# Patient Record
Sex: Female | Born: 1965 | Race: Black or African American | Hispanic: No | Marital: Married | Smoking: Former smoker
Health system: Southern US, Community
[De-identification: ages and names within clinical notes are randomized; demographics above are authoritative.]

## PROBLEM LIST (undated history)

## (undated) DIAGNOSIS — C50919 Malignant neoplasm of unspecified site of unspecified female breast: Secondary | ICD-10-CM

## (undated) DIAGNOSIS — Z87898 Personal history of other specified conditions: Secondary | ICD-10-CM

## (undated) DIAGNOSIS — I959 Hypotension, unspecified: Secondary | ICD-10-CM

## (undated) DIAGNOSIS — Z9009 Acquired absence of other part of head and neck: Secondary | ICD-10-CM

## (undated) DIAGNOSIS — E2740 Unspecified adrenocortical insufficiency: Secondary | ICD-10-CM

## (undated) DIAGNOSIS — D649 Anemia, unspecified: Secondary | ICD-10-CM

## (undated) DIAGNOSIS — G8929 Other chronic pain: Secondary | ICD-10-CM

## (undated) DIAGNOSIS — K625 Hemorrhage of anus and rectum: Secondary | ICD-10-CM

## (undated) DIAGNOSIS — Z3041 Encounter for surveillance of contraceptive pills: Secondary | ICD-10-CM

## (undated) DIAGNOSIS — N186 End stage renal disease: Secondary | ICD-10-CM

## (undated) DIAGNOSIS — T829XXA Unspecified complication of cardiac and vascular prosthetic device, implant and graft, initial encounter: Secondary | ICD-10-CM

## (undated) DIAGNOSIS — G4730 Sleep apnea, unspecified: Secondary | ICD-10-CM

## (undated) DIAGNOSIS — I10 Essential (primary) hypertension: Secondary | ICD-10-CM

## (undated) DIAGNOSIS — K661 Hemoperitoneum: Secondary | ICD-10-CM

## (undated) DIAGNOSIS — N390 Urinary tract infection, site not specified: Secondary | ICD-10-CM

## (undated) DIAGNOSIS — R51 Headache: Secondary | ICD-10-CM

## (undated) HISTORY — DX: Headache: R51

## (undated) HISTORY — DX: Other chronic pain: G89.29

## (undated) HISTORY — DX: Unspecified adrenocortical insufficiency: E27.40

## (undated) HISTORY — DX: Malignant neoplasm of unspecified site of unspecified female breast: C50.919

## (undated) HISTORY — DX: End stage renal disease: N18.6

## (undated) HISTORY — DX: Personal history of other specified conditions: Z87.898

## (undated) HISTORY — DX: Hemoperitoneum: K66.1

## (undated) HISTORY — DX: Acquired absence of other part of head and neck: Z90.09

## (undated) HISTORY — DX: Essential (primary) hypertension: I10

## (undated) HISTORY — PX: MASTECTOMY: SHX3

## (undated) HISTORY — PX: OTHER SURGICAL HISTORY: SHX169

## (undated) HISTORY — DX: Hemorrhage of anus and rectum: K62.5

## (undated) HISTORY — PX: EXPLORATION RENAL S/P TRANSPLANTATION: SUR589

## (undated) HISTORY — DX: Urinary tract infection, site not specified: N39.0

## (undated) HISTORY — PX: TUBAL LIGATION: SHX77

---

## 1997-12-16 DIAGNOSIS — E892 Postprocedural hypoparathyroidism: Secondary | ICD-10-CM

## 1997-12-16 DIAGNOSIS — Z9089 Acquired absence of other organs: Secondary | ICD-10-CM

## 1997-12-16 HISTORY — DX: Acquired absence of other organs: Z90.89

## 1997-12-16 HISTORY — DX: Postprocedural hypoparathyroidism: E89.2

## 1998-03-16 ENCOUNTER — Other Ambulatory Visit: Admission: RE | Admit: 1998-03-16 | Discharge: 1998-03-16 | Payer: Self-pay | Admitting: Nephrology

## 1998-03-18 ENCOUNTER — Other Ambulatory Visit: Admission: RE | Admit: 1998-03-18 | Discharge: 1998-03-18 | Payer: Self-pay | Admitting: Nephrology

## 1998-03-21 ENCOUNTER — Other Ambulatory Visit: Admission: RE | Admit: 1998-03-21 | Discharge: 1998-03-21 | Payer: Self-pay | Admitting: Nephrology

## 1998-04-08 ENCOUNTER — Other Ambulatory Visit: Admission: RE | Admit: 1998-04-08 | Discharge: 1998-04-08 | Payer: Self-pay | Admitting: Nephrology

## 1998-04-08 ENCOUNTER — Ambulatory Visit (HOSPITAL_COMMUNITY): Admission: AD | Admit: 1998-04-08 | Discharge: 1998-04-08 | Payer: Self-pay | Admitting: Physician Assistant

## 1998-04-10 ENCOUNTER — Other Ambulatory Visit: Admission: RE | Admit: 1998-04-10 | Discharge: 1998-04-10 | Payer: Self-pay | Admitting: *Deleted

## 1998-04-11 ENCOUNTER — Inpatient Hospital Stay (HOSPITAL_COMMUNITY): Admission: AD | Admit: 1998-04-11 | Discharge: 1998-04-14 | Payer: Self-pay | Admitting: Vascular Surgery

## 1998-04-18 ENCOUNTER — Other Ambulatory Visit: Admission: RE | Admit: 1998-04-18 | Discharge: 1998-04-18 | Payer: Self-pay | Admitting: Nephrology

## 1998-04-20 ENCOUNTER — Other Ambulatory Visit: Admission: RE | Admit: 1998-04-20 | Discharge: 1998-04-20 | Payer: Self-pay | Admitting: Nephrology

## 1998-04-22 ENCOUNTER — Emergency Department (HOSPITAL_COMMUNITY): Admission: EM | Admit: 1998-04-22 | Discharge: 1998-04-22 | Payer: Self-pay | Admitting: Emergency Medicine

## 1998-04-25 ENCOUNTER — Ambulatory Visit (HOSPITAL_COMMUNITY): Admission: RE | Admit: 1998-04-25 | Discharge: 1998-04-25 | Payer: Self-pay | Admitting: Nephrology

## 1998-05-04 ENCOUNTER — Other Ambulatory Visit: Admission: RE | Admit: 1998-05-04 | Discharge: 1998-05-04 | Payer: Self-pay | Admitting: Nephrology

## 1998-05-06 ENCOUNTER — Ambulatory Visit: Admission: RE | Admit: 1998-05-06 | Discharge: 1998-05-06 | Payer: Self-pay | Admitting: Physician Assistant

## 1998-05-06 ENCOUNTER — Other Ambulatory Visit: Admission: RE | Admit: 1998-05-06 | Discharge: 1998-05-06 | Payer: Self-pay | Admitting: Nephrology

## 1998-06-01 ENCOUNTER — Other Ambulatory Visit: Admission: RE | Admit: 1998-06-01 | Discharge: 1998-06-01 | Payer: Self-pay | Admitting: *Deleted

## 1998-06-05 ENCOUNTER — Ambulatory Visit (HOSPITAL_COMMUNITY): Admission: RE | Admit: 1998-06-05 | Discharge: 1998-06-05 | Payer: Self-pay | Admitting: Nephrology

## 1998-06-06 ENCOUNTER — Ambulatory Visit (HOSPITAL_COMMUNITY): Admission: RE | Admit: 1998-06-06 | Discharge: 1998-06-06 | Payer: Self-pay | Admitting: Vascular Surgery

## 1998-06-08 ENCOUNTER — Other Ambulatory Visit: Admission: RE | Admit: 1998-06-08 | Discharge: 1998-06-08 | Payer: Self-pay | Admitting: *Deleted

## 1998-06-08 ENCOUNTER — Ambulatory Visit (HOSPITAL_COMMUNITY): Admission: RE | Admit: 1998-06-08 | Discharge: 1998-06-08 | Payer: Self-pay | Admitting: Vascular Surgery

## 1998-06-11 ENCOUNTER — Other Ambulatory Visit: Admission: RE | Admit: 1998-06-11 | Discharge: 1998-06-11 | Payer: Self-pay | Admitting: *Deleted

## 1998-06-13 ENCOUNTER — Other Ambulatory Visit: Admission: RE | Admit: 1998-06-13 | Discharge: 1998-06-13 | Payer: Self-pay | Admitting: Nephrology

## 1998-06-15 ENCOUNTER — Other Ambulatory Visit: Admission: RE | Admit: 1998-06-15 | Discharge: 1998-06-15 | Payer: Self-pay | Admitting: *Deleted

## 1998-06-23 ENCOUNTER — Ambulatory Visit (HOSPITAL_COMMUNITY): Admission: RE | Admit: 1998-06-23 | Discharge: 1998-06-23 | Payer: Self-pay | Admitting: Nephrology

## 1998-07-01 ENCOUNTER — Other Ambulatory Visit: Admission: RE | Admit: 1998-07-01 | Discharge: 1998-07-01 | Payer: Self-pay | Admitting: *Deleted

## 1998-07-03 ENCOUNTER — Ambulatory Visit (HOSPITAL_COMMUNITY): Admission: RE | Admit: 1998-07-03 | Discharge: 1998-07-03 | Payer: Self-pay | Admitting: Nephrology

## 1998-07-06 ENCOUNTER — Other Ambulatory Visit: Admission: RE | Admit: 1998-07-06 | Discharge: 1998-07-06 | Payer: Self-pay | Admitting: *Deleted

## 1998-07-08 ENCOUNTER — Emergency Department (HOSPITAL_COMMUNITY): Admission: EM | Admit: 1998-07-08 | Discharge: 1998-07-08 | Payer: Self-pay | Admitting: Emergency Medicine

## 1998-07-19 ENCOUNTER — Ambulatory Visit (HOSPITAL_COMMUNITY): Admission: RE | Admit: 1998-07-19 | Discharge: 1998-07-19 | Payer: Self-pay | Admitting: Nephrology

## 1998-07-20 ENCOUNTER — Encounter (HOSPITAL_COMMUNITY): Admission: RE | Admit: 1998-07-20 | Discharge: 1998-10-18 | Payer: Self-pay | Admitting: Nephrology

## 1998-10-31 ENCOUNTER — Inpatient Hospital Stay (HOSPITAL_COMMUNITY): Admission: RE | Admit: 1998-10-31 | Discharge: 1998-11-01 | Payer: Self-pay

## 2001-12-23 ENCOUNTER — Encounter: Payer: Self-pay | Admitting: Nephrology

## 2001-12-23 ENCOUNTER — Encounter: Admission: RE | Admit: 2001-12-23 | Discharge: 2001-12-23 | Payer: Self-pay | Admitting: Nephrology

## 2002-05-24 ENCOUNTER — Encounter (INDEPENDENT_AMBULATORY_CARE_PROVIDER_SITE_OTHER): Payer: Self-pay | Admitting: *Deleted

## 2002-05-24 ENCOUNTER — Inpatient Hospital Stay (HOSPITAL_COMMUNITY): Admission: EM | Admit: 2002-05-24 | Discharge: 2002-05-28 | Payer: Self-pay

## 2002-05-24 ENCOUNTER — Encounter: Payer: Self-pay | Admitting: Nephrology

## 2002-05-26 ENCOUNTER — Encounter: Payer: Self-pay | Admitting: Nephrology

## 2002-05-27 ENCOUNTER — Encounter: Payer: Self-pay | Admitting: Nephrology

## 2002-09-01 ENCOUNTER — Encounter: Payer: Self-pay | Admitting: Emergency Medicine

## 2002-09-01 ENCOUNTER — Emergency Department (HOSPITAL_COMMUNITY): Admission: EM | Admit: 2002-09-01 | Discharge: 2002-09-01 | Payer: Self-pay | Admitting: Emergency Medicine

## 2002-11-09 ENCOUNTER — Inpatient Hospital Stay (HOSPITAL_COMMUNITY): Admission: AD | Admit: 2002-11-09 | Discharge: 2002-11-09 | Payer: Self-pay | Admitting: *Deleted

## 2002-11-13 ENCOUNTER — Inpatient Hospital Stay (HOSPITAL_COMMUNITY): Admission: AD | Admit: 2002-11-13 | Discharge: 2002-11-13 | Payer: Self-pay | Admitting: Obstetrics and Gynecology

## 2003-05-10 ENCOUNTER — Encounter: Admission: RE | Admit: 2003-05-10 | Discharge: 2003-05-10 | Payer: Self-pay | Admitting: Nephrology

## 2003-05-10 ENCOUNTER — Encounter: Payer: Self-pay | Admitting: Nephrology

## 2003-07-14 ENCOUNTER — Encounter: Payer: Self-pay | Admitting: Obstetrics & Gynecology

## 2003-07-14 ENCOUNTER — Ambulatory Visit (HOSPITAL_COMMUNITY): Admission: RE | Admit: 2003-07-14 | Discharge: 2003-07-14 | Payer: Self-pay | Admitting: Obstetrics & Gynecology

## 2003-08-29 ENCOUNTER — Ambulatory Visit (HOSPITAL_COMMUNITY): Admission: RE | Admit: 2003-08-29 | Discharge: 2003-08-29 | Payer: Self-pay | Admitting: Obstetrics & Gynecology

## 2003-08-29 ENCOUNTER — Encounter: Payer: Self-pay | Admitting: Obstetrics & Gynecology

## 2003-10-08 ENCOUNTER — Inpatient Hospital Stay (HOSPITAL_COMMUNITY): Admission: AD | Admit: 2003-10-08 | Discharge: 2003-10-08 | Payer: Self-pay | Admitting: Obstetrics

## 2004-06-05 ENCOUNTER — Ambulatory Visit (HOSPITAL_COMMUNITY): Admission: RE | Admit: 2004-06-05 | Discharge: 2004-06-05 | Payer: Self-pay | Admitting: Obstetrics & Gynecology

## 2005-05-07 ENCOUNTER — Encounter: Admission: RE | Admit: 2005-05-07 | Discharge: 2005-05-07 | Payer: Self-pay | Admitting: Nephrology

## 2005-10-10 ENCOUNTER — Encounter: Admission: RE | Admit: 2005-10-10 | Discharge: 2005-10-10 | Payer: Self-pay | Admitting: Nephrology

## 2005-12-15 ENCOUNTER — Emergency Department (HOSPITAL_COMMUNITY): Admission: EM | Admit: 2005-12-15 | Discharge: 2005-12-15 | Payer: Self-pay | Admitting: Emergency Medicine

## 2005-12-16 DIAGNOSIS — K625 Hemorrhage of anus and rectum: Secondary | ICD-10-CM

## 2005-12-16 HISTORY — DX: Hemorrhage of anus and rectum: K62.5

## 2006-02-03 ENCOUNTER — Emergency Department (HOSPITAL_COMMUNITY): Admission: EM | Admit: 2006-02-03 | Discharge: 2006-02-03 | Payer: Self-pay | Admitting: Emergency Medicine

## 2006-08-03 ENCOUNTER — Inpatient Hospital Stay (HOSPITAL_COMMUNITY): Admission: EM | Admit: 2006-08-03 | Discharge: 2006-08-11 | Payer: Self-pay | Admitting: Emergency Medicine

## 2006-08-05 ENCOUNTER — Encounter: Payer: Self-pay | Admitting: Vascular Surgery

## 2006-08-05 ENCOUNTER — Encounter: Payer: Self-pay | Admitting: Internal Medicine

## 2006-08-11 ENCOUNTER — Ambulatory Visit: Payer: Self-pay | Admitting: Gastroenterology

## 2006-08-16 DIAGNOSIS — K661 Hemoperitoneum: Secondary | ICD-10-CM

## 2006-08-16 DIAGNOSIS — K683 Retroperitoneal hematoma: Secondary | ICD-10-CM

## 2006-08-16 HISTORY — DX: Hemoperitoneum: K66.1

## 2006-08-16 HISTORY — DX: Retroperitoneal hematoma: K68.3

## 2006-08-26 ENCOUNTER — Inpatient Hospital Stay (HOSPITAL_COMMUNITY): Admission: EM | Admit: 2006-08-26 | Discharge: 2006-08-29 | Payer: Self-pay | Admitting: Emergency Medicine

## 2006-08-27 ENCOUNTER — Encounter (INDEPENDENT_AMBULATORY_CARE_PROVIDER_SITE_OTHER): Payer: Self-pay | Admitting: Specialist

## 2006-09-10 ENCOUNTER — Ambulatory Visit (HOSPITAL_COMMUNITY): Admission: RE | Admit: 2006-09-10 | Discharge: 2006-09-10 | Payer: Self-pay | Admitting: Nephrology

## 2006-09-17 ENCOUNTER — Ambulatory Visit: Payer: Self-pay | Admitting: Internal Medicine

## 2006-09-19 ENCOUNTER — Ambulatory Visit: Payer: Self-pay | Admitting: Gastroenterology

## 2006-09-24 ENCOUNTER — Ambulatory Visit (HOSPITAL_COMMUNITY): Admission: RE | Admit: 2006-09-24 | Discharge: 2006-09-24 | Payer: Self-pay | Admitting: Nephrology

## 2006-10-20 ENCOUNTER — Ambulatory Visit: Payer: Self-pay | Admitting: Cardiology

## 2007-01-02 ENCOUNTER — Ambulatory Visit (HOSPITAL_COMMUNITY): Admission: RE | Admit: 2007-01-02 | Discharge: 2007-01-02 | Payer: Self-pay | Admitting: Obstetrics & Gynecology

## 2007-02-02 ENCOUNTER — Ambulatory Visit (HOSPITAL_COMMUNITY): Admission: RE | Admit: 2007-02-02 | Discharge: 2007-02-02 | Payer: Self-pay | Admitting: Nephrology

## 2007-02-09 ENCOUNTER — Encounter: Admission: RE | Admit: 2007-02-09 | Discharge: 2007-02-09 | Payer: Self-pay | Admitting: Nephrology

## 2007-02-19 ENCOUNTER — Encounter (HOSPITAL_COMMUNITY): Admission: RE | Admit: 2007-02-19 | Discharge: 2007-05-20 | Payer: Self-pay | Admitting: Nephrology

## 2007-04-08 ENCOUNTER — Ambulatory Visit: Payer: Self-pay | Admitting: Vascular Surgery

## 2007-05-08 ENCOUNTER — Ambulatory Visit: Payer: Self-pay | Admitting: Vascular Surgery

## 2007-05-08 ENCOUNTER — Ambulatory Visit (HOSPITAL_COMMUNITY): Admission: RE | Admit: 2007-05-08 | Discharge: 2007-05-08 | Payer: Self-pay | Admitting: Vascular Surgery

## 2007-05-08 HISTORY — PX: AV FISTULA PLACEMENT: SHX1204

## 2007-05-15 ENCOUNTER — Ambulatory Visit (HOSPITAL_COMMUNITY): Admission: RE | Admit: 2007-05-15 | Discharge: 2007-05-15 | Payer: Self-pay | Admitting: Vascular Surgery

## 2007-06-12 ENCOUNTER — Ambulatory Visit (HOSPITAL_COMMUNITY): Admission: RE | Admit: 2007-06-12 | Discharge: 2007-06-12 | Payer: Self-pay | Admitting: Nephrology

## 2007-07-17 ENCOUNTER — Ambulatory Visit (HOSPITAL_COMMUNITY): Admission: RE | Admit: 2007-07-17 | Discharge: 2007-07-17 | Payer: Self-pay | Admitting: Nephrology

## 2007-08-18 ENCOUNTER — Ambulatory Visit (HOSPITAL_COMMUNITY): Admission: RE | Admit: 2007-08-18 | Discharge: 2007-08-18 | Payer: Self-pay | Admitting: Vascular Surgery

## 2007-08-18 ENCOUNTER — Ambulatory Visit: Payer: Self-pay | Admitting: Vascular Surgery

## 2007-08-19 ENCOUNTER — Ambulatory Visit (HOSPITAL_COMMUNITY): Admission: RE | Admit: 2007-08-19 | Discharge: 2007-08-19 | Payer: Self-pay | Admitting: Vascular Surgery

## 2007-08-23 ENCOUNTER — Emergency Department (HOSPITAL_COMMUNITY): Admission: EM | Admit: 2007-08-23 | Discharge: 2007-08-24 | Payer: Self-pay | Admitting: Emergency Medicine

## 2007-08-24 ENCOUNTER — Observation Stay (HOSPITAL_COMMUNITY): Admission: RE | Admit: 2007-08-24 | Discharge: 2007-08-24 | Payer: Self-pay | Admitting: Nephrology

## 2007-09-23 ENCOUNTER — Ambulatory Visit (HOSPITAL_COMMUNITY): Admission: RE | Admit: 2007-09-23 | Discharge: 2007-09-23 | Payer: Self-pay | Admitting: Nephrology

## 2007-09-30 ENCOUNTER — Ambulatory Visit (HOSPITAL_COMMUNITY): Admission: RE | Admit: 2007-09-30 | Discharge: 2007-09-30 | Payer: Self-pay | Admitting: Nephrology

## 2007-10-16 ENCOUNTER — Ambulatory Visit (HOSPITAL_COMMUNITY): Admission: RE | Admit: 2007-10-16 | Discharge: 2007-10-16 | Payer: Self-pay | Admitting: Nephrology

## 2007-11-20 ENCOUNTER — Ambulatory Visit (HOSPITAL_COMMUNITY): Admission: RE | Admit: 2007-11-20 | Discharge: 2007-11-20 | Payer: Self-pay | Admitting: Nephrology

## 2007-12-17 DIAGNOSIS — E2740 Unspecified adrenocortical insufficiency: Secondary | ICD-10-CM

## 2007-12-17 HISTORY — DX: Unspecified adrenocortical insufficiency: E27.40

## 2007-12-28 ENCOUNTER — Ambulatory Visit (HOSPITAL_COMMUNITY): Admission: RE | Admit: 2007-12-28 | Discharge: 2007-12-28 | Payer: Self-pay | Admitting: Nephrology

## 2008-01-06 ENCOUNTER — Ambulatory Visit (HOSPITAL_COMMUNITY): Admission: RE | Admit: 2008-01-06 | Discharge: 2008-01-06 | Payer: Self-pay | Admitting: Nephrology

## 2008-01-08 ENCOUNTER — Ambulatory Visit (HOSPITAL_COMMUNITY): Admission: RE | Admit: 2008-01-08 | Discharge: 2008-01-08 | Payer: Self-pay | Admitting: Obstetrics & Gynecology

## 2008-01-13 ENCOUNTER — Encounter (INDEPENDENT_AMBULATORY_CARE_PROVIDER_SITE_OTHER): Payer: Self-pay | Admitting: Nephrology

## 2008-01-13 ENCOUNTER — Ambulatory Visit (HOSPITAL_COMMUNITY): Admission: RE | Admit: 2008-01-13 | Discharge: 2008-01-13 | Payer: Self-pay | Admitting: Nephrology

## 2008-01-13 ENCOUNTER — Ambulatory Visit: Payer: Self-pay | Admitting: Cardiology

## 2008-02-10 ENCOUNTER — Encounter: Admission: RE | Admit: 2008-02-10 | Discharge: 2008-02-10 | Payer: Self-pay | Admitting: Obstetrics & Gynecology

## 2008-02-12 ENCOUNTER — Encounter (INDEPENDENT_AMBULATORY_CARE_PROVIDER_SITE_OTHER): Payer: Self-pay | Admitting: Diagnostic Radiology

## 2008-02-12 ENCOUNTER — Encounter: Admission: RE | Admit: 2008-02-12 | Discharge: 2008-02-12 | Payer: Self-pay | Admitting: Obstetrics & Gynecology

## 2008-02-17 ENCOUNTER — Ambulatory Visit (HOSPITAL_COMMUNITY): Admission: RE | Admit: 2008-02-17 | Discharge: 2008-02-17 | Payer: Self-pay | Admitting: Nephrology

## 2008-02-22 ENCOUNTER — Ambulatory Visit (HOSPITAL_COMMUNITY): Admission: RE | Admit: 2008-02-22 | Discharge: 2008-02-22 | Payer: Self-pay | Admitting: Nephrology

## 2008-03-04 ENCOUNTER — Encounter (HOSPITAL_BASED_OUTPATIENT_CLINIC_OR_DEPARTMENT_OTHER): Payer: Self-pay | Admitting: General Surgery

## 2008-03-04 ENCOUNTER — Encounter: Admission: RE | Admit: 2008-03-04 | Discharge: 2008-03-04 | Payer: Self-pay | Admitting: General Surgery

## 2008-03-04 ENCOUNTER — Ambulatory Visit (HOSPITAL_COMMUNITY): Admission: RE | Admit: 2008-03-04 | Discharge: 2008-03-04 | Payer: Self-pay | Admitting: General Surgery

## 2008-03-24 ENCOUNTER — Ambulatory Visit: Payer: Self-pay | Admitting: Oncology

## 2008-03-31 ENCOUNTER — Ambulatory Visit: Admission: RE | Admit: 2008-03-31 | Discharge: 2008-06-29 | Payer: Self-pay | Admitting: Radiation Oncology

## 2008-04-18 ENCOUNTER — Encounter: Admission: RE | Admit: 2008-04-18 | Discharge: 2008-04-18 | Payer: Self-pay | Admitting: Radiation Oncology

## 2008-04-27 ENCOUNTER — Ambulatory Visit (HOSPITAL_COMMUNITY): Admission: RE | Admit: 2008-04-27 | Discharge: 2008-04-27 | Payer: Self-pay | Admitting: Nephrology

## 2008-05-08 ENCOUNTER — Inpatient Hospital Stay (HOSPITAL_COMMUNITY): Admission: EM | Admit: 2008-05-08 | Discharge: 2008-05-13 | Payer: Self-pay | Admitting: Emergency Medicine

## 2008-05-08 ENCOUNTER — Ambulatory Visit: Payer: Self-pay | Admitting: Internal Medicine

## 2008-05-10 ENCOUNTER — Ambulatory Visit: Payer: Self-pay | Admitting: Internal Medicine

## 2008-05-14 ENCOUNTER — Ambulatory Visit (HOSPITAL_COMMUNITY): Admission: RE | Admit: 2008-05-14 | Discharge: 2008-05-14 | Payer: Self-pay | Admitting: Nephrology

## 2008-06-22 ENCOUNTER — Emergency Department (HOSPITAL_COMMUNITY): Admission: EM | Admit: 2008-06-22 | Discharge: 2008-06-22 | Payer: Self-pay | Admitting: Emergency Medicine

## 2008-12-14 HISTORY — PX: OTHER SURGICAL HISTORY: SHX169

## 2009-01-11 ENCOUNTER — Encounter: Admission: RE | Admit: 2009-01-11 | Discharge: 2009-01-11 | Payer: Self-pay | Admitting: Radiation Oncology

## 2009-04-05 ENCOUNTER — Encounter: Admission: RE | Admit: 2009-04-05 | Discharge: 2009-04-05 | Payer: Self-pay | Admitting: Radiation Oncology

## 2009-04-17 HISTORY — PX: OTHER SURGICAL HISTORY: SHX169

## 2009-06-02 ENCOUNTER — Ambulatory Visit: Payer: Self-pay | Admitting: Vascular Surgery

## 2009-06-02 ENCOUNTER — Ambulatory Visit (HOSPITAL_COMMUNITY): Admission: RE | Admit: 2009-06-02 | Discharge: 2009-06-02 | Payer: Self-pay | Admitting: Vascular Surgery

## 2010-03-02 ENCOUNTER — Ambulatory Visit (HOSPITAL_COMMUNITY): Admission: RE | Admit: 2010-03-02 | Discharge: 2010-03-02 | Payer: Self-pay | Admitting: Obstetrics & Gynecology

## 2010-05-23 ENCOUNTER — Encounter: Admission: RE | Admit: 2010-05-23 | Discharge: 2010-05-23 | Payer: Self-pay | Admitting: General Surgery

## 2011-01-06 ENCOUNTER — Encounter: Payer: Self-pay | Admitting: Obstetrics & Gynecology

## 2011-01-06 ENCOUNTER — Encounter: Payer: Self-pay | Admitting: Gastroenterology

## 2011-01-06 ENCOUNTER — Encounter: Payer: Self-pay | Admitting: Nephrology

## 2011-04-22 ENCOUNTER — Other Ambulatory Visit: Payer: Self-pay | Admitting: General Surgery

## 2011-04-22 DIAGNOSIS — Z9889 Other specified postprocedural states: Secondary | ICD-10-CM

## 2011-04-24 ENCOUNTER — Encounter (INDEPENDENT_AMBULATORY_CARE_PROVIDER_SITE_OTHER): Payer: Self-pay | Admitting: General Surgery

## 2011-04-30 NOTE — Op Note (Signed)
NAME:  Judy Green, Judy Green          ACCOUNT NO.:  [id number]   MEDICAL RECORD NO.:  [id number]          PATIENT TYPE:  AMB   LOCATION:  SDS                          FACILITY:  [facility]   PHYSICIAN:  Di Kindle. Edilia Bo, M.D.DATE OF BIRTH:  09-08-1966   DATE OF PROCEDURE:  DATE OF DISCHARGE:  05/08/2007                               OPERATIVE REPORT   PREOPERATIVE DIAGNOSIS:  Chronic renal failure.   POSTOPERATIVE DIAGNOSIS:  Chronic renal failure.   PROCEDURE:  Insertion of new right upper arm loop AV graft.   SURGEON:  Di Kindle. Edilia Bo, M.D.   ASSISTANT:  Ruthe Mannan, PA   ANESTHESIA:  Local with sedation.   INDICATIONS:  This is a 45 year old woman who had had bilateral forearm  and upper arm grafts and bilateral thigh grafts.  She was running out of  access options; and after reviewing her central venogram I elected to  consider a redo right upper arm graft as one of her last remaining  options for hemodialysis access.  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  With the catheter in place, the thought was  that she had significant risk of developing arm swelling.  However, she  was running out of options.  If she does develop arm swelling, the  thought was that we could potentially move her catheter to her thigh.   TECHNIQUE:  The patient was taken to the operating room and sedated by  anesthesia.  The right upper extremity was prepped and draped in the  usual sterile fashion.  After the skin was infiltrated with 1%  lidocaine, a longitudinal incision was made over the axilla; and here  the old graft was dissected free and excised.  Deep down, an adjacent  brachial vein, different from the previous anastomosis was exposed, and  this was a reasonable size vein.  I made the incision over the previous  arterial anastomosis; however, here there was dense scar tissue; and I  elected, therefore, to place an upper arm loop graft.  The brachial  artery and the axilla even at this level was quite small.  A 4-7 mm  graft was tunneled in a loop fashion in the upper arm with the arterial  aspect of the graft along the radial aspect of the upper arm.  The  patient was then heparinized.   The small brachial artery was clamped proximally and distally and a  longitudinal arteriotomy was made.  The 4-mm end of the graft was  spatulated and sewn end-to-side to the artery using continuous 6-0  Prolene suture.  The graft was then pulled to the appropriate length for  an anastomosis to the axillary vein.  The vein was ligated distally and  spatulated proximally.  The graft was cut to the appropriate length,  spatulated, and sewn end-to-end to the vein using continuous 6-0 Prolene  suture.  At the completion, there was an excellent thrill in the graft.  There was a radial and ulnar signal with the Doppler.  Hemostasis was  obtained in the wound.  The wounds were closed with a deep layer  of  3-0 Vicryl; and the skin closed with 4-0 Vicryl.  A sterile dressing  was applied.  The patient tolerated the procedure well; and was  transferred to the recovery room in satisfactory condition.  All needle  and sponge counts were correct.      Di Kindle. Edilia Bo, M.D.  Electronically Signed     CSD/MEDQ  D:  05/08/2007  T:  05/08/2007  Job:  [id number]

## 2011-04-30 NOTE — Consult Note (Signed)
NAME:  Judy Green, [place]          ACCOUNT NO.:  [id number]   MEDICAL RECORD NO.:  [id number]           PATIENT TYPE:   LOCATION:                                 FACILITY:   PHYSICIAN:  Cynthia B. Eliott Nine, M.D.DATE OF BIRTH:  1966/01/21   DATE OF CONSULTATION:  05/08/2008  DATE OF DISCHARGE:                                 CONSULTATION   PRIMARY ADMITTING TEAM:  [facility].   NEPHROLOGIST:  Dr. Annie Sable MD, [facility].   REASON FOR CONSULTATION:  Hypotension in a woman with end-stage renal  disease who is hemodialysis dependent.   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  She reports an episode of hypotension during hemodialysis  with correction of the episode to an ending blood pressure of  approximately 116/60 with the ultrafiltrate of 3 L per the patient's  report.  Of note, there are no hemodialysis records available at the  time of consultation on Sunday evening.  She gives a history of a fever  of 102 approximately a week and half ago during hemodialysis, for which  blood cultures were drawn and she was given 3 days of an unknown  antibiotic.  Since she has had access issues in the past, her subclavian  access was evaluated, but found to not have any active signs of  infection at the time she had the fever.  She does have a history of  kidney transplant in 2000 with transplant rejection in 2007.  She  received immunosuppression during the period of her transplant.  There  is no known history  of a workup for adrenal insufficiency or other  issues to explain what appears to be some chronic hypotension.  She  denies any prior hypotensive episodes to this degree.  Initial labs show  a stable hemoglobin despite FOBT-positive stools.  At the time of  consultation, she has received IV fluid resuscitation with minimal  response in her blood pressure with current readings in the systolic  90s.  She is now on Neo-Synephrine for pressor activity.  Her normal  hemodialysis days are Tuesday, Thursday, and Saturday.  She receives  dialysis at [facility].  Her estimated dry weight was  103 per the patient.  Her ending weight at hemodialysis was 106.8.  Also, of note, the patient is in the process of being evaluated for  retransplantation in both [place] and at [facility] [place].   PAST MEDICAL HISTORY:  1. Kidney transplant in 2000 with rejection in 2007.  2. History of breast cancer, ductal cell carcinoma in situ in 2009,      status post partial mastectomy.  3. History of GI bleeding secondary to hemorrhoids.  4. History of  pancreatic mass with biopsy showing hematoma.  5. History of retroperitoneal hematoma.  6. Hypertension.  7. History of E. coli urosepsis in 2007.   SOCIAL HISTORY:  Nonsmoker.  No alcohol abuse.  She is employed by  [employer] as a Occupational psychologist and continues to work  until the present time.  She is married, with 2 children.   FAMILY HISTORY:  Mother is alive with diabetes and hypertension.  Father  unknown.  Siblings have a history of hypertension.   CURRENT MEDICATIONS:  1. Elita Quick.  2. Flagyl.  3. Vancomycin.  4. Aspirin 81 mg p.o. daily.  5. Protonix 40 mg daily.  6. PhosLo 1 tablet p.o. t.i.d.  7. Lovenox for DVT prophylaxis.  8. Her home-reported meds are only PhosLo p.o.  9. During dialysis, she receives Procrit.   HEMODIALYSIS ACCESS HISTORY:  Diatek catheter in her left subclavian has  been in place for 8 months.   History of failed AV grafts.  Multiple IJ  and subclavian PermCath.   PHYSICAL EXAMINATION:  VITAL SIGNS:  Blood pressure 80/31, SpO2 97% on 2  L, heart rate 83, temperature 98.6, and respirations 18.  INPUT AND OUTPUT:  Anuric.  GENERAL:  No acute distress.  Obese and pleasant.  HEENT:  Oropharynx clear without exudates.  Pupils equal, round, and  reactive to light.  No extraocular muscle abnormalities.  NECK:  Supple.  No adenopathy.  No JVD.  LUNGS:  Clear.  Good air movement.  CV:  Regular rate and rhythm.  Distant heart sounds.  No audible murmur.  ABDOMEN:  Periumbilical tenderness, diffuse, not localized.  No  rigidity.  Positive bowel sounds.  Only slight guarding on deep  palpation.  No masses palpated.  GENITOURINARY:  No obvious abnormality.  No bladder tenderness.  No  costovertebral tenderness.  EXTREMITIES:  No edema.  No cyanosis.  Pulses are 1+, equal and  symmetric bilateral radial pulses and dorsalis pedis.  There is a scar  on her right arm where she had a parathyroid implant from prior  parathyroidectomy.  NEURO:  Nonfocal exam.  Cranial nerves II-XII intact.  SKIN:  No rashes.   LABORATORY DATA:  Sodium 137, potassium 4.2, chloride 96, bicarbonate  26, BUN 28, creatinine 11.3, and glucose 129.  Hemoglobin 13.3, white  blood cells 22.0, platelets 222, and hematocrit 42.  Lactic acid 3.5,  calcium 9.4, total protein 8.5, albumin 4.1, total bilirubin 0.8, lipase  18, and phosphorus 5.6.  PT 13.1, INR 1.0, and PTT 28.  Fecal occult  blood test positive.   IMAGING:  1. Chest x-ray on May 08, 2008:  No acute cardiopulmonary process.  2. CT of the abdomen and pelvis:  Findings consistent with atrophic      renal transplant of the right iliac fossa, ascending and transverse      colitis, left lower lobe and lingular atelectasis, and atrophic      bilateral kidneys consistent with chronic renal failure.   ASSESSMENT:  Ms. Judy Green is a 45 year old woman with  end-stage renal  disease, status post failed kidney transplant, on hemodialysis  currently, who was admitted with primary issue of severe hypotension.  She has bright red blood per rectum and evidence of lower  gastrointestinal bleeding.  The history of events is indicative of  hypotension with an unknown etiology after hemodialysis, which has  likely led to ischemic colitis.  Given her history of fevers and recent  antibiotics, cannot rule out an underlying cause of  sepsis contributing  to her hypotension.  It does not appear per her reports that there was a  significant volume reduction in hemodialysis.  Also, concerning is her  history of being on immunosuppressive therapy which included steroids.  There may be a component of adrenal insufficiency contributing to the  severity of her presentation.  I cannot find a prior workup for adrenal  insufficiency as an underlying cause of her hypotension, likely  triggered by an underlying sepsis.  Her left subclavian Diatek catheter  has some mild redness around the insertion site, but is nontender and  there is no purulent drainage from the insertion area.  She is now on  Neo-Synephrine and IV fluids to control her blood pressure.  We would  consider adding on some stress-dose steroids to cover for possible  adrenal insufficiency as well as to evaluate cortisol level.  It is  important that we obtain the records from the hemodialysis center to  establish the events that have led up to this admission.  It is  encouraging that the patient symptomatically feels well and has not been  febrile since admission.  She does have a leukocytosis which would  suggest a reaction to an underlying infection.  In terms of the colitis,  which was confirmed on CT scan, conservative symptomatic treatment for  now.  We will plan hemodialysis on her regularly scheduled time on  Tuesday and deal with any volume issues from this blood pressure  resuscitation.    PLAN:  1. Add a stress-dose steroids to current treatment for hypotension.  2. I agree with continuing Neo-Synephrine and IV fluids.  3. We will follow blood cultures obtained prior to the initiation of      antibiotics.  4. Continue with broad-spectrum antibiotic coverage with Fortabs,      Flagyl, and vancomycin.  This will cover both the possibility of a      bacteremia as well as a primary infectious source for the colitis.      We can dose these with hemodialysis.  5. We will check a parathyroid hormone, phosphorus, calcium as well as      an anemia panel to follow routine parameters for end-stage renal      disease pending obtaining hemodialysis records.  6. Her hemoglobin is stable.  We will follow CBCs for any drop in      hemoglobin.  Her blood has already been typed and crossed by the      primary team.  7. When she resumes her diet very cautiously and slowly, I recommend      standard renal diet.  8. Check serum cortisol level and administer hydrocortisone, Solu-      Cortef 100 mg q.6 hours for possible adrenal insufficiency.   Note:  I will be happy to accept this patient onto the renal service as  the primary team.  Thank you for this consultation.      Edsel Petrin, D.O.  Electronically Signed     ______________________________  Duke Salvia Eliott Nine, M.D.    Dorena Dew  D:  05/08/2008  T:  05/09/2008  Job:  [id number]

## 2011-04-30 NOTE — Letter (Signed)
August 14, 2007    Judy Green   RE:  Judy Green, Judy Green  MRN:  [id number]  /  DOB:  05-Dec-1966   Dear Ms. Hoobler:   You did not show for an appointment today which I believe was scheduled  by yourself for lower GI bleeding.  Looking back at our records, I see  you have no-showed two other times in the past 7-8 months as well as  cancelled at the last minute on another occasion.  Needless to say, this  can affect your own care as well as the care of others who would  otherwise have been seen at that time.   Please understand, I would like to help you with any future  gastrointestinal problems that you may have, but I have spoken with my  staff and we will not be arranging for you to schedule another  appointment until you are reevaluated by your primary care physician and  that physician deems that you should be referred back.   Thank you for your understanding.    Sincerely,      Rachael Fee, MD  Electronically Signed    DPJ/MedQ  DD: 08/14/2007  DT: 08/16/2007  Job #: [id number]   CC:    Mindi Slicker. Lowell Guitar, M.D.

## 2011-04-30 NOTE — Op Note (Signed)
NAME:  Judy Green, Judy Green          ACCOUNT NO.:  [id number]   MEDICAL RECORD NO.:  [id number]          PATIENT TYPE:  AMB   LOCATION:  SDS                          FACILITY:  [facility]   PHYSICIAN:  Di Kindle. Edilia Bo, M.D.DATE OF BIRTH:  [birth year]   DATE OF PROCEDURE:  DATE OF DISCHARGE:                               OPERATIVE REPORT   PREOPERATIVE DIAGNOSIS:  Chronic renal failure.   POSTOPERATIVE DIAGNOSIS:  Chronic renal failure.   PROCEDURE:  1. Attempted left internal jugular, right internal jugular, left      subclavian vein Diatek catheter.  2. Placement of a right femoral 55-cm Diatek catheter.   SURGEON:  Edilia Bo.   ASSISTANT:  Nurse.   ANESTHESIA:  Local, converted to a general.   TECHNIQUE:  This patient had had a infected catheter removed from the  right side.  I used the ultrasound scanner and both internal jugular  veins appeared to be patent.  The neck and upper chest were prepped and  draped in the usual sterile fashion.  After the skin was infiltrated  with 1% lidocaine, the left IJ was cannulated, but I was never able to  get the wire to go down into the superior vena cava from the left IJ.  I  shot a central venogram through an end-hole catheter and this showed  what appeared to be some abnormal venous anatomy with a large,  essentially sac, within the vein which appeared to connect with the  azygos system.  I was never able to demonstrate the superior vena cava.  For this reason, I elected to try a right IJ approach and again had the  same problem in that the wire would not go down into the superior vena  cava and a subsequent venogram through an end-hole catheter showed the  same venous sac with connection to what looked like the azygos system.  I then attempted a left subclavian vein approach, but the wire would not  thread despite 2 attempts.  Therefore, I elected to place a femoral  catheter.  Of note, I did not try a right subclavian vein approach  as  the patient had an infected catheter removed in that area.  The patient  was reprepped and draped.  The left and right groins were prepped and  draped in the usual sterile fashion.  The right femoral vein was  cannulated and a guidewire introduced into the inferior vena cava under  fluoroscopic control.  The tract over the wire was dilated. The dilator  and then peel-away sheath were then passed over the wire, and the  catheter was advanced as far as it would reach up to the right atrium.  The exit site for the catheter was selected, the skin anesthetized  between the two areas, the catheter was brought through the tunnel, cut  to the appropriate length and the distal ports were attached.  Both  ports withdrew easily.  We then flushed with heparinized saline and  filled with concentrated heparin.  The catheter was secured at its exit  site with a 3-0 nylon suture.  The  femoral cannulation site was closed with  a 4-0 subcuticular stitch.  Sterile dressing was applied.  The patient tolerated the procedure well,  was transferred to the recovery room in satisfactory condition.  All  needle and sponge counts were correct.      Di Kindle. Edilia Bo, M.D.  Electronically Signed     CSD/MEDQ  D:  08/19/2007  T:  08/19/2007  Job:  [id number]

## 2011-04-30 NOTE — Op Note (Signed)
NAME:  FLOELLA, ENSZ          ACCOUNT NO.:  [id number]   MEDICAL RECORD NO.:  [id number]          PATIENT TYPE:  AMB   LOCATION:  SDS                          FACILITY:  [facility]   PHYSICIAN:  Leonie Man, M.D.   DATE OF BIRTH:  08-26-1966   DATE OF PROCEDURE:  03/04/2008  DATE OF DISCHARGE:                               OPERATIVE REPORT   PREOPERATIVE DIAGNOSIS:  Ductal carcinoma in situ left breast.   POSTOPERATIVE DIAGNOSIS:  Ductal carcinoma in situ left breast.   PROCEDURE:  Partial mastectomy left breast.   SURGEON:  Leonie Man, M.D.   ASSISTANT:  OR nurse.   ANESTHESIA:  General.   HISTORY:  The patient is a 45 year old patient who is on hemodialysis  who presents for routine screening mammography which shows an area of  pleomorphic calcifications within the left breast.  This, on  stereotactic biopsy, shows ductal carcinoma in situ.  The patient  presents now for partial mastectomy of the left breast after the risks  and potential benefits of surgery have been discussed.  All questions  answered, consent obtained.   PROCEDURE IN DETAIL:  The patient is positioned supinely and following  the induction of satisfactory general anesthesia the left breast was  prepped and draped to be included in a sterile operative field.  Positive identification of the patient as Shterna Moreland and the  area to be operated on as left breast is identified.  The patient had  previously had wire localizations at the [facility]  done.  I made an elliptical incision around both of the localizing  needles, deepening this through skin and subcutaneous tissue.  There was  quite a bit of bleeding in the superficial subcutaneous tissues probably  due to previous AV shunts that had been placed in the left arm.  This  was controlled and dissection carried down around the localizing needles  carrying it all the way down to the chest wall.  The entire specimen was  removed and forwarded for pathologic evaluation.  The specimen was first  forwarded for specimen mammography.  Specimen mammography showed that  all the clips were all within the breast tissue.  I then secured  hemostasis with electrocautery.  Sponge and instrument counts were  verified.  Breast tissues were reapproximated with 2-0 Vicryl sutures  and the skin closed with running 5-0 Monocryl suture reinforced with  Steri-Strips.  A sterile compressive dressing was applied.  Anesthetic  was reversed.  The patient was removed from the operating room to the  recovery room in stable condition.  She tolerated the procedure well.     Leonie Man, M.D.  Electronically Signed    PB/MEDQ  D:  03/04/2008  T:  03/04/2008  Job:  [id number]   cc:   [facility]

## 2011-04-30 NOTE — Op Note (Signed)
NAME:  Judy Green, Judy Green          ACCOUNT NO.:  [id number]   MEDICAL RECORD NO.:  [id number]          PATIENT TYPE:  AMB   LOCATION:  SDS                          FACILITY:  [facility]   PHYSICIAN:  Quita Skye. Hart Rochester, M.D.  DATE OF BIRTH:  10-02-1966   DATE OF PROCEDURE:  05/15/2007  DATE OF DISCHARGE:                               OPERATIVE REPORT   PREOPERATIVE DIAGNOSIS:  Endstage renal disease, thrombosed  arteriovenous Gore-Tex graft right upper arm - loop.   POSTOPERATIVE DIAGNOSIS:  Endstage renal disease, thrombosed  arteriovenous Gore-Tex graft right upper arm - loop.   OPERATIONS:  Thrombectomy AV Gore-Tex graft right upper arm with  exploration of arterial and venous ends.   SURGEON:  Quita Skye. Hart Rochester, M.D.   FIRST ASSISTANT:  Nurse.   ANESTHESIA:  Local.   PROCEDURE:  The patient was taken to the operating room, placed in the  supine position at which time the right upper extremity was prepped with  Betadine scrub and solution and draped in a routine sterile manner.  After infiltration of 1% Xylocaine with epinephrine, longitudinal  incision was made through the previous scar in the upper arm near the  axilla.  The graft had only been placed about 1 week earlier.  The  arterial and venous ends were both exposed this being a loop graft with  4-7 mm Gore-Tex.  Three thousand units of heparin were given  intravenously.  Transverse opening made near the venous anastomosis  which was easily thrombectomized 5 Fogarty catheter would reversed this  into the chest.  There was no evidence of any perianastomotic stenosis  and the 5 dilator would easily traverse this.  All loose debris was  carefully removed and the graft thrombectomized with good inflow being  reestablished.  Transverse opening was also made adjacent to the  arterial anastomosis and some adherent debris was removed from this  anastomotic area, but there was no stenosis.  There was excellent inflow  present.  Both  arteriotomies were closed with 6-0 Prolene.  Clamp  released.  There was an excellent pulse and thrill in the graft  following this.  Protamine was not given, but 3000 units of heparin was  administered.  After adequate hemostasis was achieved, the wound was  closed in layers with Vicryl in a subcuticular fashion.  Sterile  dressing applied.  The patient taken to recovery room in satisfactory  condition.      Quita Skye Hart Rochester, M.D.  Electronically Signed     JDL/MEDQ  D:  05/15/2007  T:  05/15/2007  Job:  [id number]

## 2011-04-30 NOTE — H&P (Signed)
NAME:  Judy Green, Judy Green          ACCOUNT NO.:  [id number]   MEDICAL RECORD NO.:  [id number]          PATIENT TYPE:  INP   LOCATION:  2101                         FACILITY:  [facility]   PHYSICIAN:  Mobolaji B. Bakare, M.D.DATE OF BIRTH:  March 07, 1966   DATE OF ADMISSION:  05/07/2008  DATE OF DISCHARGE:                              HISTORY & PHYSICAL   PRIMARY CARE PHYSICIAN:  None.   NEPHROLOGIST:  Dr. Kathrene Bongo.   GASTROENTEROLOGIST:  Dr. Christella Hartigan.   CHIEF COMPLAINT:  Nausea, vomiting, diarrhea and hematochezia.   HISTORY OF PRESENTING COMPLAINT:  Judy Green is a 45 year old African  American female with history of end-stage renal disease on hemodialysis.  She was in her usual state of health until today about 1:00 after  completing dialysis she developed abdominal cramps associated with  nausea, vomiting and diarrhea.  Diarrhea subsequently led to bright red  blood per rectum which was mixed with stool.  She therefore presented to  the emergency room for further evaluation.  Upon arrival, blood pressure  was 80/55.  She was tachycardic with a heart rate of 113.  The patient  tells me that blood pressure at the end of dialysis was 116/49.  About 3  L of fluid was removed.  She denies fever or chills.  However, she had a  temperature of 102 about a week ago during hemodialysis.  She tells me  blood cultures were drawn then and she was given antibiotics for 3 days.  She could not remember the name of this antibiotic.  The patient is  currently receiving IV fluids.   She denies cough, chest pain, shortness of breath.  No headaches.  The  patient does not make any more urine.  There is no pedal edema,  orthopnea or PND.   REVIEW OF SYSTEMS:  As in the HPI.   PAST MEDICAL HISTORY:  1. History of hypertension.  The patient is currently not on      antihypertensive because blood pressure has been on the low side      with hemodialysis.  Hence, she was taken off antihypertensive.  2. End-stage renal disease secondary to hypertension.  She had a renal      transplant in early 2000.  The patient subsequently had rejection      in 2007 and has been on hemodialysis since then.  She has problems      with access.  Prior to renal transplant she ran out of access      sites.  Hence, she is currently getting dialysis through a Diatek      catheter placed in the left subclavian vein, placed about 8 months      ago.  3. Left breast cancer, ductal carcinoma in situ.  She is status post      partial mastectomy in March 2009 by Dr. Lurene Shadow.  She is currently      receiving radiation therapy.  No plans for chemotherapy.  4. History of GI bleed.  She has been noncompliant with follow-up at      Dr. Rob Bunting' office.  5. History of pancreatic mass being followed by  Dr. Christella Hartigan.  6. History of hemorrhoids.  7. History of urinary tract infection.   PAST SURGICAL HISTORY:  Bilateral tubal ligation.   CURRENT MEDICATIONS:  PhosLo t.i.d. with meals.   ALLERGIES:  No known drug allergies.   FAMILY HISTORY:  No significant family history.  The patient does not  know her father.  Her mom is well and alive.  She has three older  brothers.   SOCIAL HISTORY:  She does not smoke cigarettes.  She occasionally drinks  alcohol.  She works at [employer].   INITIAL VITAL SIGNS:  Temperature 97.7, current blood pressure is 98/50,  pulse 100-113, respiratory rate 20.  O2 saturation 100.   EXAMINATION:  The patient is awake, alert, oriented in time, place and  person.  Normocephalic, atraumatic.  Pupils equal, round and reactive to  light.  Extraocular muscle movement intact.  No elevated JVD.  No  carotid bruit.  Mucous membranes moist.  No oral thrush.  LUNGS:  Clear clinically to auscultation.  CHEST EXAMINATION:  The patient has a Diatek catheter on left subclavian  vein.  Catheter site looks clean and dry.  CVS:  S1 and S2, regular.   No murmur or gallop.  ABDOMEN:  Not distended.  It is obese, soft.  There is tenderness in the  left upper quadrant.  No rebound or guarding.  Bowel sounds present.  EXTREMITIES:  No pedal edema or calf tenderness.  CNS:  No focal neurological deficit.   INITIAL LABORATORY DATA:  Lactic acid was 3.5.  White cell 22.1,  hemoglobin 13.3, hematocrit 42.2, MCV 85, platelets 222, neutrophils 92%  with absolute neutrophil count of 20.3.  Fecal occult blood positive.  Lipase level 17.  Sodium 137, potassium 4.2, chloride 96, CO2 26,  glucose 129, BUN 28, creatinine 11.33.  Total bilirubin of 0.8, alkaline  phosphatase 53, AST 30, ALT 11, total protein 8.9, albumin 4.1, calcium  9.4.  PTT 28, PT/INR 13.1/1.0.  Chest x-ray shows no acute  cardiopulmonary disease, no pulmonary edema or consolidation, mild left  lower lung atelectasis versus scarring.   ASSESSMENT AND PLAN:  Judy Green is a 45 year old African American  female with end-stage renal disease and access problems.  She is  presenting with hypotension after dialysis; leukocytosis of 22,000; and  history of fever in the last 1 month.  She has associated nausea,  vomiting, diarrhea and hematochezia.  The patient will be admitted for  further evaluation and treatment.   ADMISSION DIAGNOSES:  1. Hypotension, likely secondary to sepsis.  The patient has      leukocytosis and has history of fever about a week ago.  Chest x-      ray is unremarkable for infection.  Diatek catheter site looks      clean and dry.  Will empirically start vancomycin and Fortaz.      Blood cultures will be drawn from Diatek catheter and from the      periphery.  Would try and do central line to assess CVP for better      fluid management.  Will use IV fluids judiciously.  The patient may      need vasopressor.  She will be admitted to ICU.  2. Nausea, vomiting, diarrhea, with hematochezia and abdominal pain..      Likely secondary to ischemic colitis due to  hypotension.  Will also      need to rule out C. difficile colitis.  Underlying  CKD precludes      getting CT scan of the abdomen and pelvis with IV contrast.  She is      heme positive but hemoglobin is actually better than values      in March 2009.  Will send stool for C. difficile, stool culture and      fecal leukocytes.  Will hydrate carefully and obtain GI      consultation in the morning.  3. End-stage renal disease.  Will continue hemodialysis as before.      Nephrology will be consulted.      Mobolaji B. Corky Downs, M.D.  Electronically Signed     MBB/MEDQ  D:  05/08/2008  T:  05/08/2008  Job:  [id number]   cc:   Cecille Aver, M.D.  Rachael Fee, MD  Leonie Man, M.D.

## 2011-04-30 NOTE — Discharge Summary (Signed)
NAME:  Judy Green, Judy Green          ACCOUNT NO.:  [id number]   MEDICAL RECORD NO.:  [id number]          PATIENT TYPE:  INP   LOCATION:  6739                         FACILITY:  [facility]   PHYSICIAN:  Aram Beecham B. Eliott Nine, M.D.DATE OF BIRTH:  1966/12/10   DATE OF ADMISSION:  05/08/2008  DATE OF DISCHARGE:  05/13/2008                               DISCHARGE SUMMARY   DISCHARGE DIAGNOSES:  1. Hypotension secondary to adrenal insufficiency.  2. Ischemic colitis in the setting of hypotension, post hemodialysis.  3. End-stage renal disease, hemodialysis dependent.  4. Status post failed kidney transplant in 2007, off all      immunosuppressive medications since February 2008.  5. Multiple vascular access failures:  Left PermCath, subclavian and      azygos vein with flow issues.  6. History of breast cancer, ductal cell carcinoma in situ in 2009,      currently undergoing radiation therapy, status post partial      mastectomy.  7. Gastrointestinal bleeding secondary to hemorrhoids.  8. History of pancreatic hematoma.  9. History of retroperitoneal hematoma.  10.Hypertension.  11.History of Escherichia coli urosepsis in 2007.   DISCHARGE MEDICATIONS:  1. PhosLo 667 mg 3 tablets p.o. daily.  2. Prednisone taper 40 mg, 30 mg, 20 mg, 10 mg for 2 weeks, then      reduce to 5 mg daily until reevaluated by nephrology.  3. Epogen 2000 units 3 times a week with hemodialysis.  4. Levocarnitine 2 g 3 times a week, IV push with hemodialysis.   DISPOSITION AND FOLLOWUP:  Judy Green is being discharged from the  hospital in stable and improved condition.  She will have a followup  appointment with Dr. Dayton Scrape her radiation oncologist for treatment of  her ductal cell in situ breast cancer following discharge on her regular  treatment scheduled on Tuesdays and Thursdays.  She has followup with  Dr. Leone Payor for her ischemic colitis and GI problems 2 weeks after  discharge.  Her next hemodialysis  treatment is on her regular Tuesday,  Thursday, and Saturday schedule on May 14, 2008.  Hemodialysis orders  have been faxed and confirmed.  Recommendations are all of her future  blood pressures be taken in either her ankle or thigh and goal is to  keep her systolic blood pressure during hemodialysis greater than or  equal to 100 systolic.  She will be continued on prednisone at a  physiologic dose for treatment of adrenal insufficiency.  This will need  to be followed up in the outpatient setting.  The patient will need  counseling on acute illness management with known adrenal disorder.  In  terms of her vascular access issues, she has a poorly functioning  PermCath in her left azygos vein, which has intermittent difficulties  with flow.  She will need to be evaluated for a thoracic graft by  vascular surgery in the outpatient setting, especially if there  continues to be difficulties with this current access catheter.   BRIEF ADMITTING HISTORY AND PHYSICAL:  Judy Green is a 45 year old  woman with end-stage renal disease and a failed renal transplant in  2007,  who was admitted through the [facility] Emergency Department to  the ICU with profound hypotension with systolic blood pressures around  60 mmHg as well as acute onset of bloody maroon melena stools several  hours after hemodialysis.  A review of the hemodialysis records  indicates that she was more hypotensive than usual following her  treatment.  This has been an ongoing chronic issue for her.  In the ICU,  she did not respond well to volume repletion, therefore other sources  for her hypotension are explored.   At the time of her admission, her vital signs, blood pressure 80/55,  heart rate 113, respiratory rate 20, temperature 97.7, and O2 sat 100%  on room air.   LABORATORY DATA ON ADMISSION:  WBC count 22.1, platelets 222, hemoglobin  13.3, lactic acid 3.5, sodium 137, potassium 4.2, chloride 96, CO2 of  26, glucose  129, BUN 28, creatinine 11.3, calcium 9.4, total protein  8.5, albumin 4.1, AST 30, ALT 11, alkaline phosphatase 43, bilirubin  0.8, GFR 4, and lipase 17.  Admission chest x-ray showed mild left lower  lung atelectasis with no evidence of pulmonary edema or a consolidation.  Coags, protime 13.1 and INR 1.0.   HOSPITAL COURSE BY PROBLEM:  1. Hypotension with likely ischemic colitis:  Judy Green was      admitted with profoundly decreased blood pressure with a range of      systolic between 60 and 80 and diastolic pressures in between 78G      and 40s.  Afterward, she had hemodialysis on the day of admission.      She also developed very bloody stools with acute abdominal pain and      cramping.  When she got to the emergency department, she appeared      to be in shock with a very rapid heart rate, elevated white count,      and low blood pressures.  She had no significant respiratory system      compromise.  She was transferred to the intensive care unit for      close monitoring and possible need for IV pressors and for complete      workup of her hypotension.  Her initial workup involved a complete      rule out for an infectious source of her low blood pressure.  Her      blood cultures returned negative.  Of note, hemodialysis records      indicate that she was given 3 days of doxycycline and had blood      cultures drawn 2 weeks prior to this admission, which returned      negative.  She had grossly bloody stools on physical examination      and had an elevated lactic acid, all of which were consistent with      ischemic colitis.  This was also confirmed with a CT scan of her      abdomen.  In terms of the ischemic colitis, this was managed      symptomatically.  The goal of her care was to get her blood      pressure to a normal range as soon as possible and to identify any      underlying causes for hypotension.  All culture data returned      negative.  Blood cultures were  negative x2, urine cultures      negative.  There was no identified source of infection.  Her  vascular access for hemodialysis appeared slightly red, but there      was no fluctuant mass or purulent drainage from the insertion site.      Given her history of kidney transplant and several years of taking      immunosuppressive medications including chronic prednisone, a      cortisol level was obtained which was very low at less than 3.0.      This confirmed that she had some level of adrenal insufficiency      given the fact that this should be increased in such a stressed      state.  She was then started on stress-dose hydrocortisone, which      did appear to improve her blood pressure.  She also was started on      norepinephrine at a very low rate in order to maintain a map above      65.  In terms of cardiac source of her hypotension, a 2-D echo was      obtained which was normal.  Her cardiac enzymes were negative.      There was no evidence of dehydration.  She appeared clinically      euvolemic throughout her hospitalization.  She was able to tolerate      hemodialysis despite her hypotension with negative treatments.  On      day #2 of her admission, physical exam revealed bounding pedal      pulses as well as radial pulses and question was raised in terms of      the efficacy of upper extremity blood pressure cuff readings.      Discordant presentation of her being comfortable with maps in the      40s was very unusual, therefore a recording error was discovered.      Due to all of the graft placement in her upper arms and compromise      of these vessels, it is felt that she did not have accurate blood      pressures.  Ankle pressures and thigh pressures revealed systolic      pressures in the 130s and 140s.  At that time, pressors were      stopped.  The patient started a steroid wean and the patient was      transferred off the ICU to the Renal Floor.  She continued to       improve in terms of her ischemic colitis symptoms.  By the time of      discharge, she no longer had any bloody stools and was tolerating      p.o.'s normally.  It is suspected that she did indeed have      hypotension at the time she was admitted and this led to her      ischemic colitis.  However, there is no underlying cause for this      presentation.  2. End-stage renal disease, hemodialysis dependent with multiple      vascular access issues:  Judy Green tolerated hemodialysis in      the hospital very well.  There are no metabolic derangements or      additional issues with hypotension during treatments.  She did      require thrombolytics  for her PermCath due to very poor flow      issues.  It was discovered that her graft was actually in the      azygos vein due to scarring from prior access catheters.  This  is      her last access option and she is to be evaluated for a thoracic      graft should this one continue to have flow issues.  We will      continue to use that graft for as long as possible.  3. History of breast cancer:  Judy Green is currently undergoing      radiation treatment for ductal cell carcinoma in situ by Dr.      Kathrynn Running at [facility].  Hopeful that Judy Green will      be a retransplant candidate in the near future.  However, she will      need to resolve her cancer and demonstrate relatively good health      prior to this becoming a reality.  She is very motivated and very      reliable as a patient and will hopefully do very well with      treatment of her breast cancer and also get back into a kidney      transplant program.   DISCHARGE LABORATORIES AND VITAL SIGNS:  Sodium 135, potassium 4.2,  chloride 101, bicarb 23, BUN 61, creatinine 12, and glucose 122.  Hemoglobin 10.7, hematocrit 33.6, and white blood cells 9.6.  Blood  pressure 117/63, weight 109 kg, temperature 97.9, and heart rate 76.      Edsel Petrin,  D.O.  Electronically Signed      Duke Salvia. Eliott Nine, M.D.  Electronically Signed    ELG/MEDQ  D:  06/04/2008  T:  06/04/2008  Job:  [id number]

## 2011-05-03 NOTE — Assessment & Plan Note (Signed)
[facility]                           GASTROENTEROLOGY OFFICE NOTE   NAME:Green, Judy STREIGHT                 MRN:          [id number]  DATE:09/19/2006                            DOB:          [birth year]    PRIMARY CARE PHYSICIAN:  Dr. Casimiro Needle.   PRIMARY SURGEON:  Dr. Lebron Conners.   GI PROBLEM LIST:  Recently found by CT scan to have a retroperitoneal mass  measuring 5.8 x 6.1 cm in September, 2007. Underwent endoscopic ultrasound  showing same mass, aspirate suggested this was blood clot. No atypical cells  seen. Repeat imaging September 17, 2006 showed decrease in size to 5.1 x 6.1  cm, also decreased heterogenicity.   INTERVAL HISTORY:  I last saw Judy Green when she was hospitalized at [facility]. I performed endoscopic ultrasound as described above. The  aspirate and the imaging as well as her clinical situation all seem to point  that this was a hematoma. I cannot explain why she would have a hematoma in  that region spontaneously.  Repeat CT scan done 2 days ago shows slight  decrease in size of the lesion. She has had no recurrence of pain. She does  have nausea and vomiting around the times of her dialysis, it is not clear  if these are related to this likely hematoma.   CURRENT MEDICATIONS:  Prograf, Zocor, Norvasc, CellCept, prednisone,  Lotensin.   PHYSICAL EXAMINATION:  VITAL SIGNS:  Height 5 feet, 7 inches, weight 222  pounds. Blood pressure 110/80, pulse 60.  CONSTITUTIONAL:  Obese, otherwise well appearing.  ABDOMEN:  Soft, nontender, nondistended, normal bowel sounds.   ASSESSMENT AND PLAN:  A 45 year old woman with likely spontaneous  retroperitoneal hematoma, decreased in size on interval imaging.   I spoke with Dr. Orson Slick today on the phone and we both agree that it is very  unlikely that this represents a malignancy, given its interval decrease and  the characteristics internally.  I think it is very  reasonable to repeat CT  scan in 1 month's time. If at that time there is more significant decrease  in size, I think simply following her clinically without any further imaging  would be  prudent.  I will therefore arrange for her to have repeat CT scan in 1 month  and I will contact her directly with those results.            ______________________________  Judy Fee, MD      DPJ/MedQ  DD:  09/19/2006  DT:  09/21/2006  Job #:  [id number]   cc:   Mindi Slicker. Lowell Guitar, M.D.  Lebron Conners, M.D.

## 2011-05-03 NOTE — Consult Note (Signed)
NAME:  Judy Green, Judy Green          ACCOUNT NO.:  [id number]   MEDICAL RECORD NO.:  [id number]          PATIENT TYPE:  INP   LOCATION:  5128                         FACILITY:  [facility]   PHYSICIAN:  Dyke Maes, M.D.DATE OF BIRTH:  Mar 14, 1966   DATE OF CONSULTATION:  08/26/2006  DATE OF DISCHARGE:                                   CONSULTATION   REASON FOR CONSULTATION:  Large pancreatic head mass.   HISTORY OF PRESENT ILLNESS:  Judy Green is a 45 year old  female patient  with a known prior renal transplant in 2000.  In August of 2007 she began  complaining of abdominal pain, was evaluated and found to be rejecting her  transplant was admitted to [facility] for  this problem.  It was felt at that time that her abdominal pain was  secondary to the renal rejection process.  The patient describes the pain as  being epigastric and left upper quadrant in nature, sharp, but resolve  quickly no other symptoms with the pain at that time.  The pain resolved and  did not reoccur until just prior to this admission.  This pain recurred  acutely, was very severe and was associated with recurrent episodes of  emesis.  The patient presented to the ER on 08/25/2006.  CT of the abdomen  and pelvis was done that demonstrated a large 5.6 x 6.1 x 8.5 cm mass along  the posterior aspect of the pancreas.  The radiologist was not sure if this  area arose either from the pancreas versus the duodenum.  The patient has  been admitted by nephrology services because of acute renal failure, a Vas-  Cath has been placed.  Admitting creatinine was 5.7.  White count was  24,000, hemodialysis as been initiated and surgical evaluation has been  requested for the pancreatic mass.   REVIEW OF SYSTEMS:  As per the history present illness.   PAST MEDICAL HISTORY:  1. Hypertension.  2. End-stage renal disease secondary to hypertension.  3. Steroid induced diabetes.   PAST  SURGICAL HISTORY:  1. Renal transplant in 2000, now failed.  2. Tubal ligation.   SOCIAL HISTORY:  Remote history of tobacco.  She is married.  She still  works.  She has two children in college.   FAMILY HISTORY:  Positive for hypertension, renal disease.   ALLERGIES:  NKDA.   CURRENT MEDICATIONS:  Please see medication reconciliation sheet for home  medications.  Current medications at the present time include Norvasc,  Diflucan, Os-Cal, Nephro-Vite, Protonix, prednisone, Bactrim, vancomycin,  Fortaz, Epogen, Dilaudid and Phenergan p.r.n.   PHYSICAL EXAMINATION:  GENERAL:  Pleasant, somewhat sleepy female patient  complaining of significant left upper quadrant abdominal pain.  VITAL SIGNS:  Temperature 98, BP 130/88, pulse 90 regular, respirations 20.  NEURO:  The patient is alert and oriented x3, moving all extremities x4  without focal neurological deficits.  HEENT:  Head is normocephalic.  Sclera noninjected.  Neck is supple without  adenopathy, facies are moon-like appearance secondary to steroids.  CHEST:  Bilateral lung sounds are clear to auscultation, respiratory effort  is nonlabored.  She is on room air.  CARDIAC:  S1-S2, no obvious rubs, murmurs, thrills, no gallops.  Pulses  regular.  ABDOMEN:  Is obese and soft.  Bowel sounds present.  There is a well-healed  oblique scar in the right lower abdomen.  The left upper quadrant of the  abdomen is exquisitely tender with guarding, no rebounding.  EXTREMITIES:  Symmetrical.  There was trace lower extremity edema.   LABORATORY DATA:  Blood cultures are pending.  Lipase 38, amylase 252,  sodium 139, potassium 3.4, CO2 27, BUN 23, creatinine 5.7, glucose 132.  LFTs are normal.  White count 24,000, hemoglobin 9.2, platelets 167,000.  Neutrophils 96%.  Urinalysis is negative.  This has been contaminated by  squamous epithelial cells, so inaccurate. Diagnostic CT of abdomen and  pelvis as noted, was also found to be mild  common bile duct dilatation and a  distended gallbladder.   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  Differential includes primary  pancreatic cancer versus sarcoma versus GIST.  End-stage renal disease with need to resume hemodialysis.  Hypertension.  Leukocytosis with left shift.  Anemia.   PLAN:  Dr. Orson Slick has reviewed CT films with the radiologist.  At this time  they surmise the mass may possibly be arising more from the duodenum than  the pancreas and is either a GIST or sarcoma.  We will notify  gastroenterology, Dr. Christella Hartigan, to assist [place] with endoscopic ultrasound and  biopsy to further evaluate the mass and this will help determine treatment  regimen.  Agree with Judy Green and vancomycin for the leukocytosis.  The  patient is currently only with low grade fevers.  No signs of __________ or  sepsis. Follow-up on blood cultures, repeat a CBC.  Follow up on amylase and  lipase.      Judy Green, N.P.    ______________________________  Dyke Maes, M.D.    ALE/MEDQ  D:  08/26/2006  T:  08/27/2006  Job:  [id number]

## 2011-05-03 NOTE — Consult Note (Signed)
NAME:  Judy Green, Judy Green          ACCOUNT NO.:  [id number]   MEDICAL RECORD NO.:  [id number]          PATIENT TYPE:  INP   LOCATION:  0162                         FACILITY:  [facility]   PHYSICIAN:  Terrial Rhodes, M.D.DATE OF BIRTH:  03/15/1966   DATE OF CONSULTATION:  08/03/2006  DATE OF DISCHARGE:                                   CONSULTATION   REASON FOR CONSULTATION:  Acute on chronic renal failure.   HISTORY OF PRESENT ILLNESS:  Ms. Flett is a 45 year old African-American  female with past medical history significant for end-stage renal disease  secondary to FSG, status post cadaveric kidney transplant at 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  She has been noncompliant with follow-up in our office and  has missed several appointments.  She presents today to [facility]  emergency room with a 1-week history of diarrhea with bright red blood per  rectum and crampy abdominal pain. During her work up here in the emergency  room she was noted to have a hemoglobin of 7.8 and a creatinine of 13. Of  note, she reports that she has been taking Aleve (two of them) p.r.n.  headache several times a week. She also reports that she has a history of  having bright red blood per rectum in the past and has never had a work-up.  She has had some nausea but has not been vomiting up anything.  She also  denies any dysuria, pyuria, or hematuria.   ALLERGIES:  No known drug allergies.   PAST MEDICAL HISTORY:  1. End-stage renal disease secondary to FSG status post cadaveric kidney      transplant in July, 2000.  2. 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  3. Hypertension.  4. Anemia.  5. Secondary hyperparathyroidism.  6. Obesity.  7.  Headaches.  8. Hypocalcemia.  9. Medical noncompliance.   CURRENT MEDICATIONS:  1. CellCept 1000 mg b.i.d.  2. Norvasc 10 mg daily.  3. Prednisone 10 mg daily.  4. Prograf 4 mg b.i.d.  5. Lasix 80 mg daily.  6. Lotensin 20 mg daily.  7. Calcitriol 1 mcg daily.  8. Iron sulfate 325 mg b.i.d.   FAMILY HISTORY:  Her mother is alive at age 77 with diabetes, hypertension.  Father is alive but his history is unknown to patient. She has 4 brothers  alive and well. No family history of kidney disease.   SOCIAL HISTORY:  She is married with 2 children. 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 She denies tobacco, alcohol or drug use.   REVIEW OF SYSTEMS:  GENERAL:  She does report some anorexia and malaise. No  blurred vision or photophobia. CARDIAC:  No chest pain, palpitations,  orthopnea or PND.  PULMONARY:  No shortness of breath, hemoptysis, or  productive cough. GI:  She has had some nausea, no vomiting.  She has had  bright red blood per rectum with abdominal cramping for the last week. GU:  Denies any dysuria, pyuria, hematuria, frequency or retention.  RHEUMATOLOGIC:  No arthralgias or myalgias. DERMATOLOGIC:  No rash, lumps or  bumps. HEMATOLOGIC:  The abnormal bleeding as above.  All other systems  negative.   PHYSICAL EXAMINATION:  GENERAL:  This is a well-developed, well-nourished  female lying in bed in no apparent distress.  VITAL SIGNS:  Temperature is 99.1, pulse of 103, blood pressure is 126/80,  respiratory rate is 20. Pulse oximetry is 100% on room air.  HEENT:  Normocephalic, atraumatic. Pupils are equal, round and reactive to  light. Extraocular muscles intact. No icterus. Oropharynx is without  lesions.  NECK:  Supple, no lymphadenopathy, no bruits.  LUNGS:  Clear to auscultation and percussion bilaterally. No rales or  rhonchi.  CARDIAC EXAM:  Regular rate and rhythm, no precordial rub appreciated.  ABDOMEN:  Normal active bowel sounds, soft, some mild  discomfort to deep  palpation, no guarding or rebound. She has a large scar on her right lower  quadrant. No bruits or tenderness over the allograft.  EXTREMITIES:  Are without clubbing, cyanosis or edema.   LABORATORY DATA:  Urinalysis showed small hemoglobin, 100 mg/dL protein,  positive leukocyte esterase, many bacteria, 11-20 white blood cells. White  blood cell count 8900, hemoglobin 7.8, platelets 157,000.  Sodium 134,  potassium 3.3, chloride 107, CO2 12, BUN 93, creatinine 13, glucose 103,  calcium 6, albumin 3.8. AST 10, ALT less than 8, alkaline phosphatase 31,  total bilirubin 0.3, lipase 39.   ASSESSMENT AND PLAN:  PROBLEM #1. Acute on chronic renal failure. This is  likely ischemic acute tubular necrosis due to blood loss anemia in the  setting of an ACE inhibitor and nonsteroidals although rejection or acute on  chronic rejection is also in the differential.  At this time would  recommend:  a. Stop Lotensin.  b. Check a renal ultrasound of the transplanted kidney to rule out  hydronephrosis.  c. Transfuse blood and follow urine output and renal function.  d. Continue with Prograf, CellCept and prednisone.  PROBLEM #2. Gastrointestinal bleed. Patient with bright red blood per rectum  and diarrhea for the last week with a drop in her hemoglobin. Start a proton  pump inhibitor. Stop the Aleve.  Obtain a GI consult for colonoscopy and  transfuse 2 units of blood.  PROBLEM #3. Blood loss anemia as above, type and cross and transfuse 2 units  of blood.  PROBLEM #4. Metabolic acidosis secondary to #1. Will follow after she gets  blood transfusion.  PROBLEM #5. Hypocalcemia. I will start her back on calcium carbonate 500 mg  two pills between meals and follow. Continue Calcitriol.  PROBLEM #6. Hypokalemia. Treat with potassium chloride 40 mEq x1 and hold  Lasix.  PROBLEM#7. Pyuria. _____ precautions, sensitivity, given elevated white blood cell count and bacteria.  PROBLEM  #8. Thrombocytopenia. Will continue to follow.   Thank you for this consultation.           ______________________________  Terrial Rhodes, M.D.     JC/MEDQ  D:  08/03/2006  T:  08/03/2006  Job:  [id number]

## 2011-05-03 NOTE — H&P (Signed)
NAME:  Judy Green, Judy Green          ACCOUNT NO.:  [id number]   MEDICAL RECORD NO.:  [id number]          PATIENT TYPE:  INP   LOCATION:  0162                         FACILITY:  [facility]   PHYSICIAN:  Lucita Ferrara, MD         DATE OF BIRTH:  Dec 11, 1966   DATE OF ADMISSION:  08/02/2006  DATE OF DISCHARGE:                                HISTORY & PHYSICAL   Patient is a 45 year old female who presents to the emergency room on August 03, 2006, with chief complaint of continuous rectal bleeding and diffuse  abdominal pain now for 7 days.  States that the pain began gradually over 7  days.  She also has nausea but no vomiting.  There is some shortness of  breath but the patient does not feel dizzy upon standing.  There is no  history of black stools, tarry stools, denies coffee ground emesis. He  denies hemoptysis.  Patient also denies gastroesophageal reflux disease, she  denies constipation but she has diarrhea which has bright red blood per  rectum.  She does state that she thinks that she might have hemorrhoids  because shen she is constipated at times and she feels like this exacerbates  her rectal pain symptoms.  She had a colonoscopy in 1990 prior to her kidney  transplant and during that time the colonoscopy was negative for any masses  or polyps but, according to her, did show some inflammation of the bowel.   HOME MEDICATIONS:  1. CellCept 1000 mg b.i.d.  2. Norvasc 10 mg daily.  3. Prednisone 5 mg daily.  4. Prograf 4 mg b.i.d.  5. Lasix 40 mg daily.  6. Lotensin 40 mg daily.  7. Aranesp.   PAST MEDICAL HISTORY:  Is significant for hypertension in 1998.  She also  had renal failure secondary to the hypertension and was on dialysis for 5  years, after which she got a kidney transplant in year 2000.  Ever since  that time, she has been followed up by a kidney transplant team.  She states  she has been compliant with her immunosuppressants, although lately, because  of the cost of  the medications, she has not been taking all of them.   REVIEW OF SYSTEM:  Besides the rectal bleeding, there is no focal  neurological deficits, there is no numbness, there is no blurry vision.  No  chest pain, shortness of breath, no urinary symptoms, there is no vaginal  bleeding.   PAST SURGICAL HISTORY:  Significant for:  1. Kidney transplant.  2. Tubal ligation.   SOCIAL HISTORY:  Patient is married and has 2 kids.  States that she is a  social drinker but denies drugs or alcohol.   LABORATORY WORK IN THE EMERGENCY ROOM:  White count was 8.9, hemoglobin 7.8,  hematocrit was 23.7.  Platelet count is 157, sodium 134, potassium 3.3,  chloride 107, CO2 12, BUN 93, creatinine was 13.19.  UA:  Urine color  yellow, pH 6, a small amount of hemoglobin, moderate amount of leukocyte  esterase.   RADIOLOGICAL STUDIES:  X-rays pending.  Renal ultrasound was ordered.  PHYSICAL EXAMINATION:  VITAL SIGNS:  Temperature 99.1, blood pressure  126/80, pulse 103, respirations 20.  Generally speaking, the patient is in  no acute distress.  HEENT:  Normocephalic, atraumatic.  Sclera anicteric.  Mucous membranes  moist.  No JVD, no carotid bruits.  CARDIOVASCULAR:  S1, S2 regular rate and rhythm.  No murmurs, rubs, clicks.  ABDOMEN:  Soft and nontender at this time, positive bowel sounds.  GUAIAC:  Was done, there was a large hemorrhoid found.  RECTAL:  Was done, there was a large hemorrhoid found, good rectal tone.  There was no obvious blood, stool was brown, guaiac was positive.  EXTREMITIES:  No clubbing, cyanosis or edema.  NEURO:  Alert and oriented x3.  Cranial nerves II-XII grossly intact.  SENSORY:  Within normal limits.  Upper and lower extremity strength  bilaterally 5/5 upper and lower extremities.   ASSESSMENT AND PLAN:  Patient is a 45 year old female with a history of  renal transplant for chronic renal insufficiency most likely from her  hypertensive disease.  She Presents with  rectal bleeding.  Rectal bleeding  may be secondary to her hemorrhoids or it may be related to other  inflammatory diseases of the bowel such as diverticular disease.  Must rule  out colon CA.  In turn, will get a consult for gastroenterology for possible  colonoscopy.  Will start the patient on IV fluids.  Because of a complicated  history of renal transplant and possible graft rejection, transplant team  was called and consultant came to the Emergency Room to assess the patient.  His recommendations are to type and cross two units of packed red blood  cells over four hours with Lasix 40 mg IV.  Will get a GI evaluation for  lower gastrointestinal bleed.  Will stop the Lotensin, monitor the calcium.  Will repeat the potassium with 40 mEq of KCL, will hold the Lasix.  Will  send the urine for culture, sensitivity.  Ultrasound of the kidney was  already done, will check to see if there is any hydronephrosis.  Will  continue with Prograf, CellCept and prednisone.  Will start a proton pump  inhibitor for gastroesophageal reflux disease.      Lucita Ferrara, MD  Electronically Signed     RR/MEDQ  D:  08/03/2006  T:  08/03/2006  Job:  [id number]

## 2011-05-03 NOTE — Discharge Summary (Signed)
NAME:  Judy Green, Judy Green          ACCOUNT NO.:  [id number]   MEDICAL RECORD NO.:  [id number]          PATIENT TYPE:  INP   LOCATION:  6741                         FACILITY:  [facility]   PHYSICIAN:  Corinna L. Lendell Caprice, MDDATE OF BIRTH:  Jul 11, 1966   DATE OF ADMISSION:  08/04/2006  DATE OF DISCHARGE:  08/11/2006                                 DISCHARGE SUMMARY   DISCHARGE DIAGNOSES:  1. Renal failure.  2. History of renal transplant in 2000.  3. Escherichia coli urinary tract infection.  4. Medical noncompliance.  5. Steroid-induced hyperglycemia.  6. Hypertension.  7. Limited reported hematochezia.  Colonoscopy showed no active bleeding,      but internal hemorrhoids and diverticular disease.  8. Hypokalemia.  9. Anemia.  10.Secondary hyperparathyroidism.  11.Obesity.  12.Metabolic acidosis.  13.Hypocalcemia.   MEDICATIONS AT TRANSFER:  1. Rocephin 1 gm IV q.24h. started August 10, 2006.  2. Nephro-Vite daily.  3. Protonix 40 mg daily.  4. Sliding scale insulin.  5. Norvasc 10 mg daily.  6. Prograf 4 mg p.o. b.i.d.  7. CellCept 500 mg p.o. b.i.d.  8. Calcium carbonate 1500 mg p.o. t.i.d.  9. Anusol HC PR daily for 2 more days.  10.Prednisone 40 mg daily.  11.Aranesp 200 mcg IV q Monday.   CONSULTATIONS:  1. Dr. Arrie Aran.  2. Dr. Russella Dar.   PROCEDURES:  1. Colonoscopy showing no bleeding, internal and external hemorrhoids,      diverticulosis.  2. Right internal jugular hemodialysis catheter by interventional      radiology.   DIET:  Carbohydrate modified renal.   ACTIVITY:  Ad lib.   CONDITION:  Stable.   PERTINENT LABORATORIES:  White blood cell count on admission was 14,000,  hemoglobin 8.8, hematocrit 25.6, platelet count 147.  Her lowest hemoglobin  was 7.8, and after transfusion of 2 units of packed red blood cells came up  to 9.5 and has remained stable.  Subsequent Hemoccult negative.  Basic  metabolic panel on admission showed a sodium of 131,  potassium 4.1, chloride  102, bicarbonate 11, glucose 142, BUN 106, creatinine 13.5, calcium 6.8,  albumin 3.6, phosphorus 9.3.  Today, her white blood cell count on steroids  is 23, hemoglobin 9.5, hematocrit 28, platelet count 181.  Sodium 135,  potassium 2.9, chloride 98, bicarbonate 23, glucose 187, BUN 91, creatinine  10.8, albumin 3.1, calcium 6.5, phosphorus 3.8.  Iron is 211.  TIBC 319.  Ferritin 438.  Intact PTH is 144.  Hepatitis B surface antigen negative.  UA  on August 06, 2006 was turbid with a specific gravity of 1.010, negative  glucose, negative ketone, large blood, 100 protein, negative nitrite, large  leukocyte esterase, too numerous to count white cells, 0-2 red cells, many  bacteria.  Urine culture grew out greater than 100,000 colonies of E. coli,  which was sensitive to ceftriaxone, resistance time ciprofloxacin, sensitive  to trimethoprim sulfa, tobramycin, nitrofurantoin and gentamicin,  intermediate to cephazolin, resistant to ampicillin.   SPECIAL STUDIES AND RADIOLOGY:  Vein mapping showed a small left cephalic  vein only visualized in the upper forearm.  Basilic vein not visualized.  EKG  showed normal sinus rhythm, prolonged QT.  Two views of the chest  revealed nothing acute.  Flat and upright abdominal series showed mild  constipation.  Renal ultrasound showed mild hydronephrosis of the  transplanted kidney, normal renal echogenicity and renal cortical thickness.  No renal calculi or perinephric fluid collection.   HISTORY AND HOSPITAL COURSE:  Ms. Menees is a pleasant, unassigned 45-  year-old black female who presented to the emergency room with a weeks'  worth of hematochezia.  She has a history of kidney transplant and had not  been taking her medications due to cost.  Apparently, her baseline  creatinine is reportedly about 3.5.  Please see H&P for complete details.  She had also had some abdominal pain, nausea, constipation.  She had heme  positive  brown stool, hemorrhoid, nontender abdomen.  Clear lungs.  Pulses  of 103, temperature of 99.1.  Otherwise, normal vital signs.  She was found  to be in renal failure.  Nephrology was consulted, as was GI.  Colonoscopy  showed no active bleeding.  This was presumably a limited hemorrhoidal bleed  with possibly a diverticular bleed.  She did drop her hemoglobin slightly  and was transfused during dialysis.  Nephrology initially felt this could be  ATN due to the bleeding and nonsteroidal anti-inflammatory ACE inhibitor;  however, they became more concerned that this was rejection.  Dr. Hyman Hopes  discussed the case with Dr. Jefm Miles at [facility].  The patient  had a dialysis catheter placed and received hemodialysis 3 times.  At this  point, the patient is being transferred to [facility] for biopsy and  further work-up and treatment.  The patient's hemoglobin and bleeding has  been stable.  She did receive several high doses of high-dose Solu-Medrol,  and since then her sugars have been somewhat difficult to control.  She is  currently back on just 40 mg of prednisone, so hopefully sliding scale alone  will help with this.  Dr. Kathrene Bongo has discussed the case again today  with the nephrologists as [facility], and transfer will proceed.   Total time on the day of discharge is 45 minutes.      Corinna L. Lendell Caprice, MD  Electronically Signed     CLS/MEDQ  D:  08/11/2006  T:  08/11/2006  Job:  [id number]

## 2011-05-03 NOTE — H&P (Signed)
NAME:  Judy Green, KRASZEWSKI          ACCOUNT NO.:  [id number]   MEDICAL RECORD NO.:  [id number]          PATIENT TYPE:  INP   LOCATION:  1824                         FACILITY:  [facility]   PHYSICIAN:  Dyke Maes, M.D.DATE OF BIRTH:  1966/04/09   DATE OF ADMISSION:  08/25/2006  DATE OF DISCHARGE:                                HISTORY & PHYSICAL   REASON FOR ADMISSION:  Abdominal pain and pancreatic mass.   HISTORY OF PRESENT ILLNESS:  This is a 45 -year-old black female with a  recent history of a failed renal transplant with reinstitution of  hemodialysis, who complains of 24 hours of intermittent sharp abdominal pain  with nausea and vomiting.  She said she had this pain several weeks ago when  she presented to the emergency room and was found to have a failed renal  transplant and was subsequently sent to [facility], where she underwent  renal biopsy and presumably medicines for rejection, although this was to no  avail, as she has restarted hemodialysis.  The pain did resolve during that  hospitalization at [facility], but it has recurred starting yesterday.  CT scan of  the abdomen in the emergency room shows a 5.6 x 6.1 x 8.5-cm mass along the  posterior aspect of the pancreas.  She is now being admitted for further  evaluation.   PAST MEDICAL HISTORY:  1. Hypertension.  2. End-stage renal disease secondary to hypertension.  3. Renal transplant in 2000 which has recently failed.  4. Steroid-induced diabetes.   ALLERGIES:  None.   MEDICATIONS:  1. Norvasc 10 mg daily.  2. Os-Cal 500 mg a.c.  3. Diflucan 500 mg daily.  4. Nephro-Vite one a day.  5. Protonix 40 mg a day.  6. __________  450 mg Monday, Wednesday and Friday.  7. Bactrim single strength one a day.   SOCIAL HISTORY:  She is a nonsmoker and nondrinker.  She lives with her  husband.  She has 2 children.   FAMILY HISTORY:  Negative for renal disease.  Positive for hypertension in  mother and 2 brothers.   REVIEW OF SYSTEMS:  Appetite has been good.  No shortness of breath.  No  chest pain.  Abdominal pain as noted above.  No new arthritic complaints.  No neuropathic symptoms.  No dysuria.  Rest of review of systems  unremarkable.   PHYSICAL EXAMINATION:  VITAL SIGNS:  Blood pressure 121/78, pulse 108,  temperature 97.5.  GENERAL:  A 45 year old black female in mild distress secondary to abdominal  pain.  HEENT:  Sclerae anicteric.  Extraocular muscles are intact.  LUNGS:  Clear to auscultation.  HEART:  Regular rate and rhythm without murmur, rub or gallop.  ABDOMEN:  Positive bowel sounds.  Soft.  There is mild epigastric to left  upper quadrant tenderness with no rebound and minimal guarding.  EXTREMITIES:  No clubbing, cyanosis, or edema.  Pulses are 2/4 and equal  bilaterally.  CHEST:  Right IJ PermCath with a small amount of bloody drainage, but no  frank pus.  NEUROLOGIC:  Cranial nerves intact.  Motor and sensory intact.   LABORATORY  DATA:  Sodium 139, potassium 3.4, BUN 23, creatinine 5.7, lipase  55.  Hemoglobin 9.2, white count 24,000.   IMPRESSION:  1. Pancreatic mass.  2. Hypertension.  3. End-stage renal disease.  4. Leukocytosis.   PLAN:  We will obtain Surgery consult.  We will check blood cultures x2 and  begin empiric antibiotics, mainly because she has an indwelling catheter and  an elevated white count.  Of note is the fact that her white count was  29,000 on August 21, 2006 at [facility].           ______________________________  Dyke Maes, M.D.     MTM/MEDQ  D:  08/26/2006  T:  08/26/2006  Job:  [id number]

## 2011-05-03 NOTE — Discharge Summary (Signed)
[place]. [facility]  Patient:    Judy Green, Judy Green Visit Number: [id number] MRN: [id number]          Service Type: MED Location: [id number] Attending Physician:  Irena Cords Dictated by:   Sheffield Slider, P.A.C. Admit Date:  05/24/2002 Discharge Date: 05/28/2002   CC:         [place]itt [place] C. Lowell Guitar, M.D., [facility]   Discharge Summary  DISCHARGE DIAGNOSES: 1. Escherichia coli urosepsis. 2. Renal transplant with acute renal failure. 3. Anemia.  PROCEDURES:  CAT scan of the abdomen and pelvis May 26, 2002 showed no intra-abdominal abnormality, small native kidneys.  Air bronchograms an opacity in the left lower lobe possible pneumonia with linear atelectasis at the right base.  No hydro seen in the transplanted right pelvic kidney.  There is minimal strandedness surrounding the upper portion of the transplanted kidney of questionable significance, small ovarian follicles, no free fluid. Abdominal ultrasound May 24, 2002 status post renal transplant right pelvis without abnormality identified, native left kidney not visualized with echogenic native right kidney.  HISTORY OF PRESENT ILLNESS:  The patient is a 45 year old African-American female who presents complaining of fever, chills, and pain over her transplant kidney.  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  The patient reports she was in her usual state of health until 4 p.m. the day of presentation when her first symptoms first occurred.  She also reports fatigue, generalized malaise, sharp substernal chest pain worse with movement and pain over her allograft site in her right lower quadrant.  She had nausea/vomiting twice the day  of presentation as well as diarrhea, light brown and watery, which was foul smelling.  She has urinary frequency.  She denies any hematochezia, melena, bright red blood per rectum, dysuria, hematuria, pyuria, shortness of breath, or productive cough.  The remainder of history and physical is outlined in the chart by Dr. Terrial Rhodes.  LABORATORY DATA:  Her admission labs are significant for urinalysis with 21-50 white cells, 7-10 red cells, positive leukocyte esterase.  White count 17,600 with 97% neutrophils, amylase and lipase within normal limits, BUN 24, creatinine 1.9, potassium 3.3, bicarb 21, sodium 140, calcium 8.7, total protein 6.8, albumin 3.8.  Liver function tests within normal limits.  Chest x-ray showed no acute disease.  HOSPITAL COURSE:  The patient was admitted to a private room on the renal unit due to her transplant status.  She was pan-cultured, blood cultures and urine cultures both grew E. coli.  She was empirically started on Tequin.  Her CellCept was decreased.  Her labs were followed closely.  Her white count increased to a max of 31,900 on May 28, 2002 and then slowly decreased to 11,500 at the time of discharge.  Her renal function worsened to a peak creatinine of 3.5 on May 26, 2002 with a BUN of 37.  The patient received IV hydration as well as repletion of potassium.  Her nausea and vomiting and abdominal pain gradually improved.  Prograf level was 10.3 with a normal of 5-20.  Complements were checked.  C3 was slightly low at 83 and C4 was barely out of range at 48.  By May 28, 2002, the patient was feeling much better, ready to go home without nausea and vomiting.  Her medications were adjusted. Plans were made for followup labs on June 03, 2002 and the patient was to call for a followup appointment with Dr. Lowell Guitar.  The patient was seen by Dr. Arlean Hopping at the time of discharge.  DISCHARGE MEDICATIONS: 1. Tequin 200 mg per day take one per day  for 5 days. 2. Decrease CellCept to 500 mg twice a day through the weekend and then resume    100 mg twice a day on Monday. 3. Decrease Prograf to 3 mg twice a day. 4. Hold Lotensin until labs are drawn next week.  Continue other medications    as previously stated including Norvasc, prednisone, Lasix, Aranesp.  ADDENDUM:  Hemoglobin at time of discharge was 11.5.  She will continue on her same dose of Aranesp.Dictated by:   Sheffield Slider, P.A.C. Attending Physician:  Irena Cords DD:  06/08/02 TD:  06/09/02 Job: [id number]

## 2011-05-03 NOTE — Discharge Summary (Signed)
NAME:  Judy Green, Judy Green          ACCOUNT NO.:  [id number]   MEDICAL RECORD NO.:  [id number]          PATIENT TYPE:  INP   LOCATION:  5128                         FACILITY:  [facility]   PHYSICIAN:  Dyke Maes, M.D.DATE OF BIRTH:  [birth year]   DATE OF ADMISSION:  08/26/2006  DATE OF DISCHARGE:  08/29/2006                                 DISCHARGE SUMMARY   ADMISSION DIAGNOSES:  1. Pancreatic mass.  2. Hypertension.  3. End-stage renal disease.  4. Leukocytosis.   DISCHARGE DIAGNOSES:  1. Abdominal pain with retroperitoneal hematoma near duodenum/pancreas.  2. Anemia secondary to #1 of chronic disease.  3. End stage renal disease status post failed cadaveric kidney transplant.  4. Initiation of hemodialysis.  5. Hypertension.  6. History of steroid induced diabetes.  7. Leukocytosis secondary to steroid and #1.   BRIEF HISTORY/REASON FOR ADMISSION:  Judy Green is a 45 year old black  female with history of failed renal transplant with reinstitution of  hemodialysis this year, now presents to the ER complaining of 24-hour  history of intermittent sharp upper abdominal pain with nausea and vomiting.  She said she has had dyspnea for several weeks, presented to the ER, was  found to have a failed renal transplant and subsequently sent to [facility].  She underwent renal biopsy and presumably medications for rejection  were given, although to no avail.  Transplant kidney continued to fail and  she restarted hemodialysis at [facility].  She stated her  abdominal pain did resolve during the hospitalization at [facility] and  then suddenly recurred reoccurred the day before she presented herself to  the ER.  CT scan in the ER showed a 5.6x6.1x8.5 cm mass on the posterior  aspect of the pancreas and she was admitted by Dr. Briant Cedar for further  evaluation.   PHYSICAL EXAMINATION:  Physical examination by Dr. Briant Cedar showed blood  pressure 121/78, pulse  108, temperature 97.5.  GENERAL APPEARANCE:  A 45 year old black female with mild distress secondary  to abdominal pain.  HEENT:  Sclerae was non-icteric.  Extraocular muscles intact.  LUNGS:  Clear to auscultation.  CARDIAC:  Regular rate and rhythm without murmur, rub or gallop.  ABDOMEN  Bowel sounds are positive, soft, mild epigastric to left upper  quadrant tenderness with no rebound and minimal guarding.  EXTREMITIES:  No clubbing, cyanosis or edema.  Pulses are 2/4 and equal  bilaterally.  Right IJ showed perm cath with small amount of bloody discharge but no frank  pus.  NEUROLOGIC:  Cranial nerves are intact.  Motor and sensory are intact.   LABORATORY DATA:  Sodium 139, potassium 3.4, BUN 23, creatinine 5.9, lipase  55.  Hemoglobin 9.2, white count was 24,000.   Admitted by Dr. Briant Cedar with surgical consult in place.  Check blood  cultures x2, begin empiric antibiotics, mainly because of her indwelling  catheter and elevated white count.  Of note, she had a white count of 29,000  September 6 at [facility].   HOSPITAL COURSE:  1. Abdominal mass/retroperitoneal hematoma.  The patient was seen in      consult by [facility].  Dr. Orson Slick initially saw the      patient on September 11 and notified GI to help with evaluation with      ultrasound and endoscopy with biopsy and Dr. Christella Hartigan was consulted.  She      was taken to the endoscopy lab September 12 by Dr. Rob Bunting of      [facility] who performed an endoscopy with EUSCPT and aspirations were      obtained.  On exam, it was noted that she had a large, well-circumcised      mass adjacent to the second portion of the duodenum measuring 6.7 cm      maximally.  Mass was heterogenous and predominantly hypo-necrotic.  No      sign of invasion into nearby blood vessels.  The mass was sampled with      3 passes of 22 gauge echo-tip EUS FNA needle with colored Doppler      guidance to avoid nearby vessels.  The  mass was soft and aspirate was      brownish-red.  Preliminary reading by pathology showed only blood.      Pancreatic parenchyma in the body and tail were within normal limits.      Views of the head of the pancreas were limited due to the mass      described above.  She had limited views of liver, spleen, and splenic      vessels were all normal.  Note on September 13 the patient's pain was      feeling better.  She stated she was constipated however and Sorbitol      was added to her regimen.  The mass identified by Dr. Christella Hartigan was      thought to be a hematoma, however final hematology/ cytology was      pending at the time of this dictation.  She was deemed stable for      discharge home on August 29, 2006 by Dr. Briant Cedar.  Further      followup is noted by Dr. Christella Hartigan.  He states he will arrange for CT      scan and then to follow up formally with him afterwards.  This was to      be done in 2-3 weeks.  2. End stage renal disease.  The patient continued hemodialysis and will      be on schedule at the [facility] which she lives      closer to.  Again, she will be transferring from [facility] to the      [facility].  She tolerated dialysis well, using her      IJ perm cath.  Note: The patient to have further evaluation for access      as an outpatient as [facility] in the past before her transplant in 2000 had      problems with multiple extremity access failures and had some femoral      IV vortex grafts present that they since then have concluded.  Her      perma cath was placed before this admission and it was functioning      well.  She was on 4 hours.  Her dry weight will be 100.5 kg.  3. Anemia.  Note the patient's hemoglobin dropped during hospital stay and      was 7.4 on September 13, 7.1 on September 14.  She refused any     transfusion during  hospital stay.  Epogen was instituted and she was on      18,000 units of Epogen at the time of  discharge.  We will continue to      follow up iron levels and iron studies at the kidney center.  She was      not any NSAID as an outpatient and will follow up iron studies as an      outpatient for dosing of this.  4. Continuing anemia.  Will follow up hemoglobin as an outpatient at      kidney center.  5. Leukocytosis.  Note on admission Dr. Briant Cedar noted her white count at      26,000 at [facility] before admission and still elevated here.  White      count was down to 9,600 on September 14.  Blood culture obtained no      growth at time of this dictation.  Vancomycin Fortabs were stopped.      Her IJ perm test showed no signs of infection and bloody discharge grew      no organisms.  Thus she will be sent home without any antibiotics,      follow up white count as an outpatient.  Again, I stress the patient      needs to get an evaluation for hemodialysis access.   MEDICATIONS ON DISCHARGE:  1. Nephro-Vite 1 daily.  2. Prednisone 20 mg daily and taper as instructed for nephrology team at      [facility].  3. Bactrim 1 daily.  4. Norvasc 200 mg q.h.s.  5. Os-Cal 500 mg 1 a.c.  6. Valcyte 450 mg Monday, Wednesday, Friday.  7. Protonix 40 mg q.h.s.  8. Diflucan 50 mg daily, continue to finish out for her 4 weeks schedule.  9. Sorbitol 1-2 tablets p.o. q.4h p.r.n. constipation  10.Epogen 18,000 units q.hemodialysis.   FOLLOW UP:  Follow up appointments with Dr. Christella Hartigan in his office as  arranged.  Also note Dr. Christella Hartigan stated he will arrange a CT scan of the  abdomen in 2-3 weeks.  Will follow up this with the kidney center to make  sure this is done.  Hemodialysis at [facility] Tuesday,  Thursday, Saturday schedule at 11:45 a.m.   DIET:  Renal failure with p.o. restrictions due to steroid-induced diabetes  but hyperglycemia was not an issue during this hospitalization and she is  not on any insulin or p.o. agents for this.   In terms of dosing her Heparin  as instructions of Dr. Briant Cedar, no Heparin  for one week and then __________  for 2 weeks, then continue __________  heparin.      Donald Pore, P.A.    ______________________________  Dyke Maes, M.D.    DWZ/MEDQ  D:  08/29/2006  T:  08/30/2006  Job:  [id number]   cc:   [facility]   Dyke Maes, M.D.  Fax: [phone]   Lebron Conners, M.D.  [address], [address]  [place]  [place] [address]   Rachael Fee, MD  [address]  [place], [place] [address]

## 2011-08-10 ENCOUNTER — Emergency Department (HOSPITAL_COMMUNITY)
Admission: EM | Admit: 2011-08-10 | Discharge: 2011-08-10 | Disposition: A | Discharge status: Home / Self Care | Payer: No Typology Code available for payment source | Attending: Emergency Medicine | Admitting: Emergency Medicine

## 2011-08-10 ENCOUNTER — Emergency Department (HOSPITAL_COMMUNITY): Payer: No Typology Code available for payment source

## 2011-08-10 DIAGNOSIS — R110 Nausea: Secondary | ICD-10-CM | POA: Insufficient documentation

## 2011-08-10 DIAGNOSIS — Z79899 Other long term (current) drug therapy: Secondary | ICD-10-CM | POA: Insufficient documentation

## 2011-08-10 DIAGNOSIS — E669 Obesity, unspecified: Secondary | ICD-10-CM | POA: Insufficient documentation

## 2011-08-10 DIAGNOSIS — Z853 Personal history of malignant neoplasm of breast: Secondary | ICD-10-CM | POA: Insufficient documentation

## 2011-08-10 DIAGNOSIS — M546 Pain in thoracic spine: Secondary | ICD-10-CM | POA: Insufficient documentation

## 2011-08-10 DIAGNOSIS — R071 Chest pain on breathing: Secondary | ICD-10-CM | POA: Insufficient documentation

## 2011-08-10 DIAGNOSIS — I10 Essential (primary) hypertension: Secondary | ICD-10-CM | POA: Insufficient documentation

## 2011-08-10 DIAGNOSIS — M542 Cervicalgia: Secondary | ICD-10-CM | POA: Insufficient documentation

## 2011-08-10 LAB — BASIC METABOLIC PANEL WITH GFR
BUN: 48 mg/dL — ABNORMAL HIGH (ref 6–23)
CO2: 24 meq/L (ref 19–32)
Calcium: 11.1 mg/dL — ABNORMAL HIGH (ref 8.4–10.5)
Chloride: 96 meq/L (ref 96–112)
Creatinine, Ser: 11.89 mg/dL — ABNORMAL HIGH (ref 0.50–1.10)
GFR calc Af Amer: 4 mL/min — ABNORMAL LOW
GFR calc non Af Amer: 3 mL/min — ABNORMAL LOW
Glucose, Bld: 110 mg/dL — ABNORMAL HIGH (ref 70–99)
Potassium: 4.3 meq/L (ref 3.5–5.1)
Sodium: 139 meq/L (ref 135–145)

## 2011-08-10 LAB — CBC
HCT: 34.7 % — ABNORMAL LOW (ref 36.0–46.0)
Hemoglobin: 11.5 g/dL — ABNORMAL LOW (ref 12.0–15.0)
MCH: 30.3 pg (ref 26.0–34.0)
MCHC: 33.1 g/dL (ref 30.0–36.0)
MCV: 91.6 fL (ref 78.0–100.0)
Platelets: 202 K/uL (ref 150–400)
RBC: 3.79 MIL/uL — ABNORMAL LOW (ref 3.87–5.11)
RDW: 13.5 % (ref 11.5–15.5)
WBC: 6.7 K/uL (ref 4.0–10.5)

## 2011-08-17 ENCOUNTER — Emergency Department (HOSPITAL_COMMUNITY)
Admission: EM | Admit: 2011-08-17 | Discharge: 2011-08-17 | Disposition: A | Discharge status: Home / Self Care | Payer: No Typology Code available for payment source | Attending: Emergency Medicine | Admitting: Emergency Medicine

## 2011-08-17 ENCOUNTER — Emergency Department (HOSPITAL_COMMUNITY): Payer: No Typology Code available for payment source

## 2011-08-17 DIAGNOSIS — I10 Essential (primary) hypertension: Secondary | ICD-10-CM | POA: Insufficient documentation

## 2011-08-17 DIAGNOSIS — S5010XA Contusion of unspecified forearm, initial encounter: Secondary | ICD-10-CM | POA: Insufficient documentation

## 2011-08-17 DIAGNOSIS — M7989 Other specified soft tissue disorders: Secondary | ICD-10-CM

## 2011-08-17 DIAGNOSIS — Z940 Kidney transplant status: Secondary | ICD-10-CM | POA: Insufficient documentation

## 2011-08-17 DIAGNOSIS — M79609 Pain in unspecified limb: Secondary | ICD-10-CM | POA: Insufficient documentation

## 2011-09-04 ENCOUNTER — Ambulatory Visit
Admission: RE | Admit: 2011-09-04 | Discharge: 2011-09-04 | Disposition: A | Discharge status: Home / Self Care | Payer: 59 | Source: Ambulatory Visit | Attending: Internal Medicine | Admitting: Internal Medicine

## 2011-09-04 ENCOUNTER — Other Ambulatory Visit: Payer: Self-pay | Admitting: Internal Medicine

## 2011-09-04 DIAGNOSIS — M79609 Pain in unspecified limb: Secondary | ICD-10-CM

## 2011-09-05 ENCOUNTER — Other Ambulatory Visit: Payer: Self-pay | Admitting: Internal Medicine

## 2011-09-05 DIAGNOSIS — E041 Nontoxic single thyroid nodule: Secondary | ICD-10-CM

## 2011-09-06 ENCOUNTER — Ambulatory Visit
Admission: RE | Admit: 2011-09-06 | Discharge: 2011-09-06 | Disposition: A | Discharge status: Home / Self Care | Payer: 59 | Source: Ambulatory Visit | Attending: Internal Medicine | Admitting: Internal Medicine

## 2011-09-06 DIAGNOSIS — E041 Nontoxic single thyroid nodule: Secondary | ICD-10-CM

## 2011-09-09 LAB — CBC
HCT: 34.5 — ABNORMAL LOW
Hemoglobin: 11.1 — ABNORMAL LOW
MCHC: 32.3
MCHC: 32.2
MCV: 92.5
MCV: 93.4
Platelets: ADEQUATE
RBC: 3.32 — ABNORMAL LOW
RBC: 3.7 — ABNORMAL LOW
RDW: 17.6 — ABNORMAL HIGH

## 2011-09-09 LAB — COMPREHENSIVE METABOLIC PANEL
ALT: 9
CO2: 29
Calcium: 9.2
Creatinine, Ser: 10.2 — ABNORMAL HIGH
GFR calc non Af Amer: 4 — ABNORMAL LOW
Glucose, Bld: 103 — ABNORMAL HIGH

## 2011-09-09 LAB — PROTIME-INR
INR: 1
Prothrombin Time: 13.7
Prothrombin Time: 13

## 2011-09-09 LAB — DIFFERENTIAL
Basophils Absolute: 0
Eosinophils Absolute: 0.2
Lymphocytes Relative: 10 — ABNORMAL LOW
Lymphs Abs: 1.1
Neutrophils Relative %: 79 — ABNORMAL HIGH

## 2011-09-09 LAB — POCT I-STAT 4, (NA,K, GLUC, HGB,HCT)
Hemoglobin: 12.2
Sodium: 135

## 2011-09-09 LAB — BASIC METABOLIC PANEL
BUN: 60 — ABNORMAL HIGH
CO2: 24
Chloride: 97
Creatinine, Ser: 16.88 — ABNORMAL HIGH
Glucose, Bld: 86

## 2011-09-11 ENCOUNTER — Other Ambulatory Visit: Payer: Self-pay | Admitting: Internal Medicine

## 2011-09-11 DIAGNOSIS — E042 Nontoxic multinodular goiter: Secondary | ICD-10-CM

## 2011-09-11 LAB — CBC
HCT: 32.5 — ABNORMAL LOW
HCT: 33.6 — ABNORMAL LOW
HCT: 34.6 — ABNORMAL LOW
HCT: 35.4 — ABNORMAL LOW
Hemoglobin: 10.4 — ABNORMAL LOW
Hemoglobin: 10.7 — ABNORMAL LOW
Hemoglobin: 11.3 — ABNORMAL LOW
Hemoglobin: 13.3
MCHC: 30.8
MCHC: 31.6
MCHC: 31.9
MCV: 83.9
MCV: 84
Platelets: 249
Platelets: 250
RBC: 3.85 — ABNORMAL LOW
RBC: 4.01
RBC: 4.12
RBC: 4.95
RDW: 23 — ABNORMAL HIGH
WBC: 17.6 — ABNORMAL HIGH
WBC: 22.1 — ABNORMAL HIGH
WBC: 9.6

## 2011-09-11 LAB — RENAL FUNCTION PANEL
Albumin: 2.8 — ABNORMAL LOW
BUN: 59 — ABNORMAL HIGH
BUN: 61 — ABNORMAL HIGH
CO2: 22
CO2: 23
CO2: 25
Calcium: 8.7
Chloride: 101
Chloride: 95 — ABNORMAL LOW
Creatinine, Ser: 12.9 — ABNORMAL HIGH
Glucose, Bld: 124 — ABNORMAL HIGH
Glucose, Bld: 81
Potassium: 4.5
Sodium: 137

## 2011-09-11 LAB — CULTURE, BLOOD (ROUTINE X 2)

## 2011-09-11 LAB — DIFFERENTIAL
Basophils Absolute: 0
Basophils Absolute: 0
Eosinophils Absolute: 0
Eosinophils Absolute: 0
Lymphocytes Relative: 5 — ABNORMAL LOW
Lymphs Abs: 0.7
Lymphs Abs: 0.7
Monocytes Absolute: 1.1 — ABNORMAL HIGH
Neutro Abs: 13 — ABNORMAL HIGH
Neutrophils Relative %: 92 — ABNORMAL HIGH

## 2011-09-11 LAB — COMPREHENSIVE METABOLIC PANEL
ALT: 11
AST: 30
Albumin: 3 — ABNORMAL LOW
Alkaline Phosphatase: 43
BUN: 39 — ABNORMAL HIGH
CO2: 26
Calcium: 8.6
Calcium: 9.4
Chloride: 96
GFR calc non Af Amer: 4 — ABNORMAL LOW
Glucose, Bld: 112 — ABNORMAL HIGH
Glucose, Bld: 129 — ABNORMAL HIGH
Potassium: 4.2
Sodium: 137
Total Bilirubin: 0.8
Total Protein: 6.8

## 2011-09-11 LAB — CORTISOL: Cortisol, Plasma: 5.8

## 2011-09-11 LAB — LACTIC ACID, PLASMA: Lactic Acid, Venous: 3.5 — ABNORMAL HIGH

## 2011-09-11 LAB — OCCULT BLOOD X 1 CARD TO LAB, STOOL: Fecal Occult Bld: POSITIVE

## 2011-09-11 LAB — CROSSMATCH

## 2011-09-11 LAB — LIPASE, BLOOD: Lipase: 17

## 2011-09-11 LAB — STOOL CULTURE

## 2011-09-11 LAB — CARBOXYHEMOGLOBIN
Methemoglobin: 2.7 — ABNORMAL HIGH
O2 Saturation: 60.9
Total hemoglobin: 10.9 — ABNORMAL LOW

## 2011-09-11 LAB — CLOSTRIDIUM DIFFICILE EIA: C difficile Toxins A+B, EIA: NEGATIVE

## 2011-09-11 LAB — FECAL LACTOFERRIN, QUANT

## 2011-09-11 LAB — MAGNESIUM: Magnesium: 2.3

## 2011-09-12 LAB — CBC
HCT: 38.4
Hemoglobin: 12.1
MCHC: 31.6
MCV: 83.4
RDW: 22.5 — ABNORMAL HIGH

## 2011-09-12 LAB — DIFFERENTIAL
Basophils Relative: 0
Eosinophils Absolute: 0.1
Monocytes Absolute: 0.7
Neutro Abs: 4.5

## 2011-09-12 LAB — POCT I-STAT, CHEM 8
BUN: 43 — ABNORMAL HIGH
Calcium, Ion: 1.1 — ABNORMAL LOW
Creatinine, Ser: 13.1 — ABNORMAL HIGH
TCO2: 27

## 2011-09-24 ENCOUNTER — Other Ambulatory Visit (HOSPITAL_COMMUNITY): Payer: Self-pay | Admitting: Nephrology

## 2011-09-24 DIAGNOSIS — N186 End stage renal disease: Secondary | ICD-10-CM

## 2011-09-25 ENCOUNTER — Ambulatory Visit
Admission: RE | Admit: 2011-09-25 | Discharge: 2011-09-25 | Disposition: A | Discharge status: Home / Self Care | Payer: 59 | Source: Ambulatory Visit | Attending: Internal Medicine | Admitting: Internal Medicine

## 2011-09-25 ENCOUNTER — Other Ambulatory Visit (HOSPITAL_COMMUNITY)
Admission: RE | Admit: 2011-09-25 | Discharge: 2011-09-25 | Disposition: A | Discharge status: Home / Self Care | Payer: Medicare Other | Source: Ambulatory Visit | Attending: Interventional Radiology | Admitting: Interventional Radiology

## 2011-09-25 DIAGNOSIS — E049 Nontoxic goiter, unspecified: Secondary | ICD-10-CM | POA: Insufficient documentation

## 2011-09-25 DIAGNOSIS — E042 Nontoxic multinodular goiter: Secondary | ICD-10-CM

## 2011-09-27 ENCOUNTER — Ambulatory Visit (HOSPITAL_COMMUNITY)
Admission: RE | Admit: 2011-09-27 | Discharge: 2011-09-27 | Disposition: A | Discharge status: Home / Self Care | Payer: Medicare Other | Source: Ambulatory Visit | Attending: Nephrology | Admitting: Nephrology

## 2011-09-27 DIAGNOSIS — Z992 Dependence on renal dialysis: Secondary | ICD-10-CM | POA: Insufficient documentation

## 2011-09-27 DIAGNOSIS — N186 End stage renal disease: Secondary | ICD-10-CM | POA: Insufficient documentation

## 2011-09-27 DIAGNOSIS — Z452 Encounter for adjustment and management of vascular access device: Secondary | ICD-10-CM | POA: Insufficient documentation

## 2011-09-27 LAB — CATH TIP CULTURE

## 2011-09-27 LAB — BASIC METABOLIC PANEL
BUN: 58 — ABNORMAL HIGH
Chloride: 94 — ABNORMAL LOW
Potassium: 6.4
Sodium: 133 — ABNORMAL LOW

## 2011-09-27 LAB — POCT I-STAT 4, (NA,K, GLUC, HGB,HCT)
Potassium: 5.7 — ABNORMAL HIGH
Sodium: 131 — ABNORMAL LOW

## 2011-09-27 LAB — CBC
Hemoglobin: 13.7
MCHC: 31.9
RBC: 5.04

## 2011-09-27 LAB — WOUND CULTURE

## 2011-09-27 LAB — PROTIME-INR
INR: 1
INR: 1

## 2012-07-15 ENCOUNTER — Encounter: Payer: Self-pay | Admitting: Vascular Surgery

## 2012-07-21 ENCOUNTER — Encounter: Payer: Self-pay | Admitting: Vascular Surgery

## 2012-07-22 ENCOUNTER — Encounter: Payer: Self-pay | Admitting: Cardiology

## 2012-07-22 ENCOUNTER — Ambulatory Visit (INDEPENDENT_AMBULATORY_CARE_PROVIDER_SITE_OTHER): Payer: 59 | Admitting: Cardiology

## 2012-07-22 ENCOUNTER — Encounter: Payer: Self-pay | Admitting: Vascular Surgery

## 2012-07-22 ENCOUNTER — Ambulatory Visit (INDEPENDENT_AMBULATORY_CARE_PROVIDER_SITE_OTHER): Payer: Medicare Other | Admitting: Vascular Surgery

## 2012-07-22 VITALS — BP 74/53 | HR 101 | Resp 18 | Ht 67.0 in | Wt 237.0 lb

## 2012-07-22 VITALS — BP 76/52 | HR 91 | Ht 67.0 in | Wt 236.4 lb

## 2012-07-22 DIAGNOSIS — E2749 Other adrenocortical insufficiency: Secondary | ICD-10-CM

## 2012-07-22 DIAGNOSIS — N186 End stage renal disease: Secondary | ICD-10-CM

## 2012-07-22 DIAGNOSIS — E2740 Unspecified adrenocortical insufficiency: Secondary | ICD-10-CM

## 2012-07-22 DIAGNOSIS — Z992 Dependence on renal dialysis: Secondary | ICD-10-CM | POA: Insufficient documentation

## 2012-07-22 DIAGNOSIS — I959 Hypotension, unspecified: Secondary | ICD-10-CM

## 2012-07-22 DIAGNOSIS — R55 Syncope and collapse: Secondary | ICD-10-CM

## 2012-07-22 DIAGNOSIS — I9589 Other hypotension: Secondary | ICD-10-CM | POA: Insufficient documentation

## 2012-07-22 HISTORY — DX: Hypotension, unspecified: I95.9

## 2012-07-22 NOTE — Assessment & Plan Note (Signed)
By my exam the patient has fairly profound hypotension and is symptomatic. I think this is the cause of her syncope. I do not see any obvious cardiac reason for hypotension. Her ECG and cardiac exam are normal. We will obtain an echocardiogram to assess her LV function and to rule out pericardial effusion but I think is very unlikely that she has tamponade. I am concerned that she potentially has significant adrenal insufficiency. This may be the cause of her profound hypotension. I think she needs thorough evaluation of her hormonal status. Have discussed this with her primary care Dr. Nehemiah Settle who has agreed to evaluate this further. If ultimately her adrenal workup and echocardiogram are unremarkable then I would recommend a medical trial with midodrine titrated to effect.

## 2012-07-22 NOTE — Progress Notes (Signed)
[facility] [place]  Patient name: Judy Green MRN: [id number] DOB: 06/24/1966 Sex: female  REASON FOR VISIT: requesting removal of nonfunctioning right forearm AV graft  HPI: Judy Green is a 46 y.o. female who currently dialyzes with a HeRO graft in the right upper arm. She's had a nonfunctioning graft in the right forearm for many years which she states never functioned. 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 She requests that the graft be removed. She's had no fever or chills. She feels that the pain in her right forearm has gradually gotten worse.   REVIEW OF SYSTEMS: [X ] denotes positive finding; [  ] denotes negative finding  CARDIOVASCULAR:  [ ] chest pain   [ ] dyspnea on exertion    CONSTITUTIONAL:  [ ] fever   [ ] chills  PHYSICAL EXAM: Filed Vitals:   07/22/12 1329  BP: 74/53  Pulse: 101  Resp: 18  Height: 5' 7" (1.702 m)  Weight: 237 lb (107.502 kg)   Body mass index is 37.12 kg/(m^2). GENERAL: The patient is a well-nourished female, in no acute distress. The vital signs are documented above. CARDIOVASCULAR: There is a regular rate and rhythm  PULMONARY: There is good air exchange bilaterally without wheezing or rales. She has a nonfunctioning right forearm graft. I cannot palpate a right radial pulse although she does have a brisk radial and ulnar signal on the right with the Doppler. She has a functioning right upper arm graft.  MEDICAL ISSUES: I've explained that I am agreeable to remove her graft although I was sure that she understood this would require multiple incisions given that the graft is well scarred in. We'll have to arrange is on a nondialysis day and this has been scheduled for 08/07/2012. All of her questions were answered and she is agreeable to proceed.  Chera Slivka S [facility] [place] Beeper: [phone]     

## 2012-07-22 NOTE — Progress Notes (Signed)
Judy Green Date of Birth: [birth year] Medical Record #[id number]  History of Present Illness: [name] is seen at the request of Dr. Kathrene Bongo for evaluation of syncope and lightheadedness. She is a pleasant 46 year old black female who has a history of end-stage renal disease and is on chronic hemodialysis. She states that 2 or 3 months ago she had bent over and when she stood up she became lightheaded and then passed out. She had dialysis the day prior. Her blood pressure at that time was 84/20. Since that time she has had recurrent episodes of lightheadedness. She becomes extremely weak and shaky. She has had at least 2 episodes of frank syncope. She describes these as "seizures". She notes that her low pressure drops significantly with dialysis treatment. This is limited somewhat her ability to maintain her dry weight. Of note the patient did have a renal transplant in 2000. She was maintained on high-dose prednisone for the duration of her transplant. In 2007 her transplant failed and she went back on hemodialysis. Apparently at that time it was attempted to stop her prednisone but she became adrenally insufficient and since then has been on chronic steroids albeit at a low dose. The only assessment of this that I can find was a cortisol level in 2009 of 5.8. It is unclear whether this was on or off therapy. She does complain of feeling short of breath all the time. Her weight is higher than normal. She denies any recent increase in edema. She has had no chest pain. She did have an echocardiogram in 2009 which showed normal LV function. Current Outpatient Prescriptions on File Prior to Visit  Medication Sig Dispense Refill  . aspirin 81 MG tablet Take 325 mg by mouth daily.       . Calcium Acetate 667 MG TABS Take by mouth 3 (three) times daily with meals.       . clopidogrel (PLAVIX) 75 MG tablet Take 75 mg by mouth daily.        Marland Kitchen gabapentin (NEURONTIN) 100 MG capsule Take 100  mg by mouth as directed.       Marland Kitchen JOLIVETTE 0.35 MG tablet Take 1 tablet by mouth daily.       . predniSONE (DELTASONE) 5 MG tablet Take 2.5 mg by mouth daily.         No Known Allergies  Past Medical History  Diagnosis Date  . Hypertension   . Chronic kidney disease     KIDNEY FAILURE  . ESRD (end stage renal disease)     Complications of transplanted kidney  . Rectal bleeding 2007    hemorrhoids/diverticular disease  . Retroperitoneal hematoma 9/07    transfusion  . H/O parathyroidectomy 1999  . UTI (lower urinary tract infection)   . Breast cancer     RT  . Adrenal insufficiency 2009    Past Surgical History  Procedure Date  . Av fistula placement 05/08/2007    Right AVG  . Hero avg 12/14/2008    Left   . Hero 04/17/2009    Removed clotted Left Hero  2) New Right Hero placed and Stent to SVC  . Lumpectomy     left breast  . Exploration renal s/p transplantation   . Tubal ligation     History  Smoking status  . Former Smoker  . Quit date: 12/16/2002  Smokeless tobacco  . Not on file    History  Alcohol Use  . Yes    2-3x/week  Family History  Problem Relation Age of Onset  . Diabetes Mother   . Diabetes Father   . Hypertension Father   . Heart disease Maternal Grandmother   . Diabetes Maternal Grandmother   . Stroke Maternal Grandmother   . Hypertension Brother   . Hypertension Brother   . Hypertension Brother     Review of Systems: The review of systems is positive for multiple AV fistula revisions. She states she is on aspirin and Plavix because her blood tends to clot when she is in dialysis.  All other systems were reviewed and are negative.  Physical Exam: BP 76/52  Pulse 91  Ht 5\' 7"  (1.702 m)  Wt 236 lb 6.4 oz (107.23 kg)  BMI 37.03 kg/m2 She is a morbidly obese black female in no acute distress. She is normocephalic, atraumatic. Her pupils are equal round and reactive. Sclera are clear. Her oropharynx is clear. Neck is supple without  adenopathy. Thyroid appears mildly enlarged. She has no jugular venous distention. Lungs are clear. Cardiac exam reveals a regular rate and rhythm without gallop, murmur, or rub. Abdomen is morbidly obese, soft, and nontender. Extremities reveal multiple surgical scars in her arms related to previous AV fistulas. Blood pressure was obtained with measurement at the wrist and ankle. She has no edema. She is alert and oriented x3. Cranial nerves II through XII are intact.  LABORATORY DATA:  ECG demonstrates normal sinus rhythm with a normal ECG. Recent laboratory data reveals hemoglobin 11.5, platelets 211, white blood count 4700. Sodium 135, potassium 5.8, bicarbonate 21, creatinine 14.1. Phosphorus is 6.1. Magnesium 2.2. Assessment / Plan:

## 2012-07-22 NOTE — Patient Instructions (Signed)
We will schedule you for an echocardiogram  I will discuss with your other doctors a referral to endocrinology for evaluation of your hormone status  No change in your medications at this time

## 2012-07-29 ENCOUNTER — Encounter (HOSPITAL_COMMUNITY): Payer: Self-pay | Admitting: Pharmacy Technician

## 2012-07-29 ENCOUNTER — Other Ambulatory Visit (HOSPITAL_COMMUNITY): Payer: Medicare Other

## 2012-07-29 ENCOUNTER — Other Ambulatory Visit: Payer: Self-pay | Admitting: *Deleted

## 2012-07-31 ENCOUNTER — Ambulatory Visit (HOSPITAL_COMMUNITY): Payer: Medicare Other | Attending: Cardiology

## 2012-07-31 ENCOUNTER — Other Ambulatory Visit: Payer: Self-pay

## 2012-07-31 ENCOUNTER — Telehealth: Payer: Self-pay

## 2012-07-31 DIAGNOSIS — N186 End stage renal disease: Secondary | ICD-10-CM | POA: Insufficient documentation

## 2012-07-31 DIAGNOSIS — E2740 Unspecified adrenocortical insufficiency: Secondary | ICD-10-CM

## 2012-07-31 DIAGNOSIS — R55 Syncope and collapse: Secondary | ICD-10-CM | POA: Insufficient documentation

## 2012-07-31 DIAGNOSIS — I059 Rheumatic mitral valve disease, unspecified: Secondary | ICD-10-CM | POA: Insufficient documentation

## 2012-07-31 DIAGNOSIS — R0609 Other forms of dyspnea: Secondary | ICD-10-CM | POA: Insufficient documentation

## 2012-07-31 DIAGNOSIS — R0989 Other specified symptoms and signs involving the circulatory and respiratory systems: Secondary | ICD-10-CM | POA: Insufficient documentation

## 2012-07-31 DIAGNOSIS — I079 Rheumatic tricuspid valve disease, unspecified: Secondary | ICD-10-CM | POA: Insufficient documentation

## 2012-07-31 DIAGNOSIS — I959 Hypotension, unspecified: Secondary | ICD-10-CM

## 2012-07-31 NOTE — Telephone Encounter (Signed)
Received phone call from patient upset about myoview results.Patient was re assured.Patient was instructed how to take NTG.Advised to keep appointment with Ward Givens NP this Monday 08/03/12.

## 2012-07-31 NOTE — Progress Notes (Signed)
Echocardiogram performed.  

## 2012-08-04 ENCOUNTER — Other Ambulatory Visit: Payer: Self-pay

## 2012-08-04 MED ORDER — MIDODRINE HCL 5 MG PO TABS: 5.0000 mg | ORAL_TABLET | Freq: Three times a day (TID) | ORAL | Status: DC

## 2012-08-06 ENCOUNTER — Encounter (HOSPITAL_COMMUNITY): Payer: Self-pay

## 2012-08-06 MED ORDER — DEXTROSE 5 % IV SOLN
1.5000 g | INTRAVENOUS | Status: AC
  Administered 2012-08-07: 1.5 g via INTRAVENOUS
  Filled 2012-08-06 (×2): qty 1.5

## 2012-08-07 ENCOUNTER — Encounter (HOSPITAL_COMMUNITY): Payer: Self-pay | Admitting: Certified Registered"

## 2012-08-07 ENCOUNTER — Ambulatory Visit (HOSPITAL_COMMUNITY): Payer: Medicare Other | Admitting: Certified Registered"

## 2012-08-07 ENCOUNTER — Encounter (HOSPITAL_COMMUNITY)
Admission: RE | Disposition: A | Discharge status: Home / Self Care | Payer: Self-pay | Source: Ambulatory Visit | Attending: Vascular Surgery

## 2012-08-07 ENCOUNTER — Ambulatory Visit (HOSPITAL_COMMUNITY): Payer: Medicare Other

## 2012-08-07 ENCOUNTER — Ambulatory Visit (HOSPITAL_COMMUNITY)
Admission: RE | Admit: 2012-08-07 | Discharge: 2012-08-07 | Disposition: A | Discharge status: Home / Self Care | Payer: Medicare Other | Source: Ambulatory Visit | Attending: Vascular Surgery | Admitting: Vascular Surgery

## 2012-08-07 DIAGNOSIS — Y849 Medical procedure, unspecified as the cause of abnormal reaction of the patient, or of later complication, without mention of misadventure at the time of the procedure: Secondary | ICD-10-CM | POA: Insufficient documentation

## 2012-08-07 DIAGNOSIS — T82898A Other specified complication of vascular prosthetic devices, implants and grafts, initial encounter: Secondary | ICD-10-CM | POA: Insufficient documentation

## 2012-08-07 DIAGNOSIS — I120 Hypertensive chronic kidney disease with stage 5 chronic kidney disease or end stage renal disease: Secondary | ICD-10-CM | POA: Insufficient documentation

## 2012-08-07 DIAGNOSIS — N186 End stage renal disease: Secondary | ICD-10-CM | POA: Insufficient documentation

## 2012-08-07 DIAGNOSIS — Z992 Dependence on renal dialysis: Secondary | ICD-10-CM | POA: Insufficient documentation

## 2012-08-07 DIAGNOSIS — G4730 Sleep apnea, unspecified: Secondary | ICD-10-CM | POA: Insufficient documentation

## 2012-08-07 HISTORY — DX: Sleep apnea, unspecified: G47.30

## 2012-08-07 HISTORY — PX: AVGG REMOVAL: SHX5153

## 2012-08-07 HISTORY — DX: Encounter for surveillance of contraceptive pills: Z30.41

## 2012-08-07 HISTORY — DX: Anemia, unspecified: D64.9

## 2012-08-07 LAB — POCT I-STAT 4, (NA,K, GLUC, HGB,HCT)
Glucose, Bld: 88 mg/dL (ref 70–99)
HCT: 42 % (ref 36.0–46.0)
Hemoglobin: 14.3 g/dL (ref 12.0–15.0)

## 2012-08-07 LAB — GLUCOSE, CAPILLARY: Glucose-Capillary: 73 mg/dL (ref 70–99)

## 2012-08-07 SURGERY — REMOVAL OF ARTERIOVENOUS GORETEX GRAFT (AVGG)
Anesthesia: Monitor Anesthesia Care | Site: Arm Lower | Laterality: Right | Wound class: Clean

## 2012-08-07 MED ORDER — MUPIROCIN 2 % EX OINT
TOPICAL_OINTMENT | CUTANEOUS | Status: AC
  Administered 2012-08-07: 1 via NASAL
  Filled 2012-08-07: qty 22

## 2012-08-07 MED ORDER — ONDANSETRON HCL 4 MG/2ML IJ SOLN
INTRAMUSCULAR | Status: DC | PRN
  Administered 2012-08-07: 4 mg via INTRAVENOUS

## 2012-08-07 MED ORDER — OXYCODONE HCL 5 MG PO TABS: 5.0000 mg | ORAL_TABLET | ORAL | Status: AC | PRN

## 2012-08-07 MED ORDER — MIDAZOLAM HCL 5 MG/5ML IJ SOLN
INTRAMUSCULAR | Status: DC | PRN
  Administered 2012-08-07: 1 mg via INTRAVENOUS

## 2012-08-07 MED ORDER — SODIUM CHLORIDE 0.9 % IV SOLN
INTRAVENOUS | Status: DC
  Administered 2012-08-07: 13:00:00 via INTRAVENOUS

## 2012-08-07 MED ORDER — LACTATED RINGERS IV SOLN: INTRAVENOUS | Status: DC | PRN

## 2012-08-07 MED ORDER — 0.9 % SODIUM CHLORIDE (POUR BTL) OPTIME
TOPICAL | Status: DC | PRN
  Administered 2012-08-07: 1000 mL

## 2012-08-07 MED ORDER — LIDOCAINE HCL (CARDIAC) 20 MG/ML IV SOLN
INTRAVENOUS | Status: DC | PRN
  Administered 2012-08-07: 60 mg via INTRAVENOUS

## 2012-08-07 MED ORDER — PHENYLEPHRINE HCL 10 MG/ML IJ SOLN
INTRAMUSCULAR | Status: DC | PRN
  Administered 2012-08-07 (×2): 80 ug via INTRAVENOUS

## 2012-08-07 MED ORDER — ONDANSETRON HCL 4 MG/2ML IJ SOLN: 4.0000 mg | Freq: Once | INTRAMUSCULAR | Status: DC | PRN

## 2012-08-07 MED ORDER — OXYCODONE HCL 5 MG PO TABS
5.0000 mg | ORAL_TABLET | Freq: Once | ORAL | Status: AC
  Administered 2012-08-07: 5 mg via ORAL

## 2012-08-07 MED ORDER — PROPOFOL 10 MG/ML IV EMUL
INTRAVENOUS | Status: DC | PRN
  Administered 2012-08-07: 200 mg via INTRAVENOUS
  Administered 2012-08-07: 20 mg via INTRAVENOUS

## 2012-08-07 MED ORDER — OXYCODONE HCL 5 MG PO TABS
ORAL_TABLET | ORAL | Status: AC
  Filled 2012-08-07: qty 1

## 2012-08-07 MED ORDER — METHYLPREDNISOLONE SODIUM SUCC 125 MG IJ SOLR
INTRAMUSCULAR | Status: DC | PRN
  Administered 2012-08-07: 125 mg via INTRAVENOUS

## 2012-08-07 MED ORDER — FENTANYL CITRATE 0.05 MG/ML IJ SOLN
INTRAMUSCULAR | Status: DC | PRN
  Administered 2012-08-07: 200 ug via INTRAVENOUS
  Administered 2012-08-07: 50 ug via INTRAVENOUS

## 2012-08-07 MED ORDER — HYDROMORPHONE HCL PF 1 MG/ML IJ SOLN: 0.2500 mg | INTRAMUSCULAR | Status: DC | PRN

## 2012-08-07 MED ORDER — MIDODRINE HCL 5 MG PO TABS
5.0000 mg | ORAL_TABLET | Freq: Once | ORAL | Status: AC
  Administered 2012-08-07: 5 mg via ORAL
  Filled 2012-08-07: qty 1

## 2012-08-07 MED ORDER — EPHEDRINE SULFATE 50 MG/ML IJ SOLN
INTRAMUSCULAR | Status: DC | PRN
  Administered 2012-08-07: 5 mg via INTRAVENOUS
  Administered 2012-08-07 (×2): 10 mg via INTRAVENOUS

## 2012-08-07 MED ORDER — PHENYLEPHRINE HCL 10 MG/ML IJ SOLN
10.0000 mg | INTRAVENOUS | Status: DC | PRN
  Administered 2012-08-07: 50 ug/min via INTRAVENOUS

## 2012-08-07 SURGICAL SUPPLY — 40 items
BANDAGE ELASTIC 4 VELCRO ST LF (GAUZE/BANDAGES/DRESSINGS) ×2 IMPLANT
BANDAGE GAUZE ELAST BULKY 4 IN (GAUZE/BANDAGES/DRESSINGS) ×2 IMPLANT
BENZOIN TINCTURE PRP APPL 2/3 (GAUZE/BANDAGES/DRESSINGS) ×2 IMPLANT
CANISTER SUCTION 2500CC (MISCELLANEOUS) ×2 IMPLANT
CLIP TI MEDIUM 6 (CLIP) ×2 IMPLANT
CLIP TI WIDE RED SMALL 6 (CLIP) ×2 IMPLANT
CLOTH BEACON ORANGE TIMEOUT ST (SAFETY) ×2 IMPLANT
COVER SURGICAL LIGHT HANDLE (MISCELLANEOUS) ×2 IMPLANT
DECANTER SPIKE VIAL GLASS SM (MISCELLANEOUS) IMPLANT
DERMABOND ADVANCED (GAUZE/BANDAGES/DRESSINGS) ×1
DERMABOND ADVANCED .7 DNX12 (GAUZE/BANDAGES/DRESSINGS) ×1 IMPLANT
ELECT REM PT RETURN 9FT ADLT (ELECTROSURGICAL) ×2
ELECTRODE REM PT RTRN 9FT ADLT (ELECTROSURGICAL) ×1 IMPLANT
GLOVE BIO SURGEON STRL SZ 6.5 (GLOVE) ×2 IMPLANT
GLOVE BIO SURGEON STRL SZ7.5 (GLOVE) ×2 IMPLANT
GLOVE BIOGEL PI IND STRL 6.5 (GLOVE) ×1 IMPLANT
GLOVE BIOGEL PI IND STRL 7.0 (GLOVE) ×1 IMPLANT
GLOVE BIOGEL PI IND STRL 7.5 (GLOVE) ×2 IMPLANT
GLOVE BIOGEL PI INDICATOR 6.5 (GLOVE) ×1
GLOVE BIOGEL PI INDICATOR 7.0 (GLOVE) ×1
GLOVE BIOGEL PI INDICATOR 7.5 (GLOVE) ×2
GLOVE SS N UNI LF 7.5 STRL (GLOVE) ×2 IMPLANT
GOWN PREVENTION PLUS XLARGE (GOWN DISPOSABLE) ×2 IMPLANT
GOWN STRL NON-REIN LRG LVL3 (GOWN DISPOSABLE) ×6 IMPLANT
KIT BASIN OR (CUSTOM PROCEDURE TRAY) ×2 IMPLANT
KIT ROOM TURNOVER OR (KITS) ×2 IMPLANT
NS IRRIG 1000ML POUR BTL (IV SOLUTION) ×2 IMPLANT
PACK CV ACCESS (CUSTOM PROCEDURE TRAY) ×2 IMPLANT
PAD ARMBOARD 7.5X6 YLW CONV (MISCELLANEOUS) ×4 IMPLANT
SPONGE GAUZE 4X4 12PLY (GAUZE/BANDAGES/DRESSINGS) ×2 IMPLANT
SPONGE SURGIFOAM ABS GEL 100 (HEMOSTASIS) IMPLANT
SUT PROLENE 6 0 CC (SUTURE) ×2 IMPLANT
SUT VIC AB 3-0 SH 27 (SUTURE) ×2
SUT VIC AB 3-0 SH 27X BRD (SUTURE) ×2 IMPLANT
SUT VICRYL 4-0 PS2 18IN ABS (SUTURE) IMPLANT
TAPE CLOTH SURG 4X10 WHT LF (GAUZE/BANDAGES/DRESSINGS) ×2 IMPLANT
TOWEL OR 17X24 6PK STRL BLUE (TOWEL DISPOSABLE) ×2 IMPLANT
TOWEL OR 17X26 10 PK STRL BLUE (TOWEL DISPOSABLE) ×2 IMPLANT
UNDERPAD 30X30 INCONTINENT (UNDERPADS AND DIAPERS) ×2 IMPLANT
WATER STERILE IRR 1000ML POUR (IV SOLUTION) ×2 IMPLANT

## 2012-08-07 NOTE — Progress Notes (Signed)
Adm BP 77/30, hr NSR in the 90's. Pt is easily arousable and approp. Dr Katrinka Blazing aware---will cont to monitor for now.  Pt states her BP has been running in the 70's to 80's and that she was started on [place] for this yesterday.

## 2012-08-07 NOTE — Interval H&P Note (Signed)
History and Physical Interval Note:  08/07/2012 11:59 AM  Judy Green  has presented today for surgery, with the diagnosis of ESRD  The various methods of treatment have been discussed with the patient and family. After consideration of risks, benefits and other options for treatment, the patient has consented to  Procedure(s) (LRB): REMOVAL OF ARTERIOVENOUS GORETEX GRAFT (AVGG) (Right) as a surgical intervention .  The patient's history has been reviewed, patient examined, no change in status, stable for surgery.  I have reviewed the patient's chart and labs.  Questions were answered to the patient's satisfaction.     Carolin Quang

## 2012-08-07 NOTE — H&P (View-Only) (Signed)
[facility]  Patient name: Judy Green MRN: [id number] DOB: 06/09/1966 Sex: female  REASON FOR VISIT: requesting removal of nonfunctioning right forearm AV graft  HPI: Judy Green is a 46 y.o. female who currently dialyzes with a HeRO graft in the right upper arm. She's had a nonfunctioning graft in the right forearm for many years which she states never functioned. 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 She requests that the graft be removed. She's had no fever or chills. She feels that the pain in her right forearm has gradually gotten worse.   REVIEW OF SYSTEMS: Arly.Keller ] denotes positive finding; [  ] denotes negative finding  CARDIOVASCULAR:  [ ]  chest pain   [ ]  dyspnea on exertion    CONSTITUTIONAL:  [ ]  fever   [ ]  chills  PHYSICAL EXAM: Filed Vitals:   07/22/12 1329  BP: 74/53  Pulse: 101  Resp: 18  Height: 5\' 7"  (1.702 m)  Weight: 237 lb (107.502 kg)   Body mass index is 37.12 kg/(m^2). GENERAL: The patient is a well-nourished female, in no acute distress. The vital signs are documented above. CARDIOVASCULAR: There is a regular rate and rhythm  PULMONARY: There is good air exchange bilaterally without wheezing or rales. She has a nonfunctioning right forearm graft. I cannot palpate a right radial pulse although she does have a brisk radial and ulnar signal on the right with the Doppler. She has a functioning right upper arm graft.  MEDICAL ISSUES: I've explained that I am agreeable to remove her graft although I was sure that she understood this would require multiple incisions given that the graft is well scarred in. We'll have to arrange is on a nondialysis day and this has been scheduled for 08/07/2012. All of her questions were answered and she is agreeable to proceed.  DICKSON,CHRISTOPHER S [facility] [place] Beeper: [phone]

## 2012-08-07 NOTE — Preoperative (Signed)
Beta Blockers   Reason not to administer Beta Blockers:Not Applicable 

## 2012-08-07 NOTE — Anesthesia Postprocedure Evaluation (Signed)
Anesthesia Post Note  Patient: Judy Green  Procedure(s) Performed: Procedure(s) (LRB): REMOVAL OF ARTERIOVENOUS GORETEX GRAFT (AVGG) (Right)  Anesthesia type: general  Patient location: PACU  Post pain: Pain level controlled  Post assessment: Patient's Cardiovascular Status Stable  Last Vitals:  Filed Vitals:   08/07/12 1600  BP:   Pulse:   Temp: 36.1 C  Resp: 18    Post vital signs: Reviewed and stable  Level of consciousness: sedated  Complications: No apparent anesthesia complications

## 2012-08-07 NOTE — Transfer of Care (Signed)
Immediate Anesthesia Transfer of Care Note  Patient: Judy Green  Procedure(s) Performed: Procedure(s) (LRB): REMOVAL OF ARTERIOVENOUS GORETEX GRAFT (AVGG) (Right)  Patient Location: PACU  Anesthesia Type: General  Level of Consciousness: awake, alert  and oriented  Airway & Oxygen Therapy: Patient Spontanous Breathing and Patient connected to nasal cannula oxygen  Post-op Assessment: Report given to PACU RN  Post vital signs: Reviewed and stable  Complications: No apparent anesthesia complications

## 2012-08-07 NOTE — Progress Notes (Signed)
Dr Early notified of SBP in the 70's and that pt has had 1 dose of po midorine. Pt states her BP is taken in RLE due to old HD accesses in LUE. BP 97/45 in RLE.  Dr early aware and OK to DC pt to home. Pt is awake, alert and wants to go home. No c/o. Dr Michelle Piper updated and he said he will sign pt out.

## 2012-08-07 NOTE — Op Note (Signed)
OPERATIVE REPORT  DATE OF SURGERY: 08/07/2012  PATIENT: Judy Green, 46 y.o. female MRN: [id number]  DOB: Aug 12, 1966  PRE-OPERATIVE DIAGNOSIS: Pain in the right forearm with nonfunctioning AV Gore-Tex graft  POST-OPERATIVE DIAGNOSIS:  Same  PROCEDURE: Removal of nonfunctioning right forearm loop AV Gore-Tex graft  SURGEON:  Gretta Began, M.D.  PHYSICIAN ASSISTANT: Doylene Canard  ANESTHESIA:  Gen.  EBL: 50 ml     BLOOD ADMINISTERED: None  DRAINS: None  SPECIMEN: None  COUNTS CORRECT:  YES  PLAN OF CARE: PACU   PATIENT DISPOSITION:  PACU - hemodynamically stable  PROCEDURE DETAILS: The patient had been seen in the office by Dr. Edilia Bo with pain in her forearm overlying a nonfunctional right forearm loop graft. She does have a right upper arm hero catheter. She is scheduled today for removal of a nonfunctional forearm loop graft to acute improve pain. Dr. Edilia Bo is continuing in surgery and I discussed this with the patient that I could remove her graft to allow her to be discharged earlier. She is comfortable with this. I did explain was impossible no of this will improve her pain. I explained the pain could be from other causes and typical leave these grafts do not cause pain. She understands. The pain is specifically overlying the graft. We will remove her graft to hopefully improve her symptoms.  The right arm was prepped and draped in usual fashion. An incision was made over the antecubital space and carried down to isolate the 4 mm portion of the tapered graft that was at the anastomosis at the brachial artery. The graft was transected near the brachial artery leaving a small cuff on the artery. The graft was occluded at this level and was oversewn with a running 6-0 Prolene suture. 2 other incisions were made over the distal forearm near the apex of the loop on the radial and ulnar aspect of the loop. The graft was exposed to this area and was mobilized and  removed in its entirety. The old nonfunctional graft had not been sewn to the vein from a prior revision. There were irrigated with saline and hemostasis obtained electrocautery. The wounds were closed with 3-0 Vicryl in the subcutaneous and subcuticular tissue. Benzoin Steri-Strips were applied.   Gretta Began, M.D. 08/07/2012 2:10 PM

## 2012-08-07 NOTE — Anesthesia Preprocedure Evaluation (Addendum)
Anesthesia Evaluation  Patient identified by MRN, date of birth, ID band Patient awake    Reviewed: Allergy & Precautions, H&P , NPO status , Patient's Chart, lab work & pertinent test results  Airway Mallampati: I TM Distance: >3 FB     Dental  (+) Teeth Intact and Dental Advisory Given   Pulmonary sleep apnea ,          Cardiovascular hypertension, Rhythm:regular Rate:Normal     Neuro/Psych    GI/Hepatic   Endo/Other    Renal/GU ESRF, CRF and Dialysis     Musculoskeletal   Abdominal   Peds  Hematology   Anesthesia Other Findings Hx of adrenal insuff.  Reproductive/Obstetrics                          Anesthesia Physical Anesthesia Plan  ASA: III  Anesthesia Plan: MAC and General   Post-op Pain Management:    Induction: Intravenous  Airway Management Planned: Mask and LMA  Additional Equipment:   Intra-op Plan:   Post-operative Plan:   Informed Consent: I have reviewed the patients History and Physical, chart, labs and discussed the procedure including the risks, benefits and alternatives for the proposed anesthesia with the patient or authorized representative who has indicated his/her understanding and acceptance.     Plan Discussed with: CRNA, Anesthesiologist and Surgeon  Anesthesia Plan Comments:         Anesthesia Quick Evaluation

## 2012-08-07 NOTE — Interval H&P Note (Signed)
History and Physical Interval Note:  08/07/2012 12:01 PM  Judy Green  has presented today for surgery, with the diagnosis of ESRD  The various methods of treatment have been discussed with the patient and family. After consideration of risks, benefits and other options for treatment, the patient has consented to  Procedure(s) (LRB): REMOVAL OF ARTERIOVENOUS GORETEX GRAFT (AVGG) (Right) as a surgical intervention .  The patient's history has been reviewed, patient examined, no change in status, stable for surgery.  I have reviewed the patient's chart and labs.  Questions were answered to the patient's satisfaction.     EARLY, TODD

## 2012-08-11 ENCOUNTER — Encounter (HOSPITAL_COMMUNITY): Payer: Self-pay | Admitting: Vascular Surgery

## 2012-08-13 ENCOUNTER — Ambulatory Visit: Payer: Medicare Other | Admitting: Endocrinology

## 2012-08-18 ENCOUNTER — Encounter: Payer: Self-pay | Admitting: Gastroenterology

## 2012-08-21 ENCOUNTER — Encounter: Payer: Self-pay | Admitting: Nurse Practitioner

## 2012-08-21 ENCOUNTER — Ambulatory Visit (INDEPENDENT_AMBULATORY_CARE_PROVIDER_SITE_OTHER): Payer: Medicare Other | Admitting: Nurse Practitioner

## 2012-08-21 VITALS — BP 88/52 | HR 78 | Ht 66.0 in | Wt 238.2 lb

## 2012-08-21 DIAGNOSIS — I951 Orthostatic hypotension: Secondary | ICD-10-CM

## 2012-08-21 DIAGNOSIS — N186 End stage renal disease: Secondary | ICD-10-CM

## 2012-08-21 MED ORDER — MIDODRINE HCL 5 MG PO TABS: 5.0000 mg | ORAL_TABLET | Freq: Three times a day (TID) | ORAL | Status: DC

## 2012-08-21 NOTE — Progress Notes (Signed)
Judy Green Date of Birth: February 21, 1966 Medical Record #[id number]  History of Present Illness: Ms. Damico is seen back today for a 2 week check. She is seen for Dr. [place]. She is a pleasant 46 year old black female with ESRD on chronic hemodialysis. Has had issues with lightheadedness and orthostasis. She was noted to be profoundly hypotensive when seen prior by Dr. [place]. Her ECG and exam were normal. An echo was obtained which showed mild LVH with normal systolic function. Hormonal work up to rule out adrenal insufficiency with Dr. Nehemiah Settle was advised. She has had prior failed renal transplant and had been on chronic steroid therapy and became adrenally insufficient at that time.  Midodrine was added after echo.  She comes in today. She is here alone. She is doing a little better. Less lightheaded and dizzy. No syncope. BP has improved but still on the low side. BP is around 100 when she starts her dialysis and it will drop but overall she does think it is a little better with the Midodrine.  No chest pain. She is going to be having a series of tests with Dr. Nehemiah Settle next Wednesday for her presumed adrenal insufficiency.   Current Outpatient Prescriptions on File Prior to Visit  Medication Sig Dispense Refill  . aspirin EC 325 MG tablet Take 325 mg by mouth daily.      . Calcium Acetate 667 MG TABS Take 1,334 mg by mouth 3 (three) times daily with meals.       . clopidogrel (PLAVIX) 75 MG tablet Take 75 mg by mouth daily.        Marland Kitchen FOSRENOL 1000 MG chewable tablet Chew 1,000 mg by mouth 3 (three) times daily with meals.       . gabapentin (NEURONTIN) 100 MG capsule Take 100 mg by mouth 3 (three) times daily.       Marland Kitchen JOLIVETTE 0.35 MG tablet Take 1 tablet by mouth daily.       . midodrine (PROAMATINE) 5 MG tablet Take 1 tablet (5 mg total) by mouth 3 (three) times daily.  100 tablet  6  . predniSONE (DELTASONE) 5 MG tablet Take 2.5 mg by mouth daily.       . SENSIPAR 30 MG tablet Take  30 mg by mouth 2 (two) times daily.         No Known Allergies  Past Medical History  Diagnosis Date  . Chronic kidney disease     KIDNEY FAILURE  . ESRD (end stage renal disease)     Complications of transplanted kidney  . Rectal bleeding 2007    hemorrhoids/diverticular disease  . Retroperitoneal hematoma 9/07    transfusion  . H/O parathyroidectomy 1999  . UTI (lower urinary tract infection)   . Breast cancer     RT  . Adrenal insufficiency 2009  . Sleep apnea     does not have cpap at this time  . Anemia     hx of  . Oral contraceptive use     patient states has not had menstral cycle in 1 year.  . Hypertension     Dr. Renford Dills [phone]    Past Surgical History  Procedure Date  . Av fistula placement 05/08/2007    Right AVG  . Hero avg 12/14/2008    Left   . Hero 04/17/2009    Removed clotted Left Hero  2) New Right Hero placed and Stent to SVC  . Lumpectomy  left breast  . Exploration renal s/p transplantation   . Tubal ligation   . Avgg removal 08/07/2012    Procedure: REMOVAL OF ARTERIOVENOUS GORETEX GRAFT (AVGG);  Surgeon: Larina Earthly, MD;  Location: [facility] OR;  Service: Vascular;  Laterality: Right;    History  Smoking status  . Former Smoker  . Quit date: 12/16/2002  Smokeless tobacco  . Not on file    History  Alcohol Use  . Yes    2-3x/week    Family History  Problem Relation Age of Onset  . Diabetes Mother   . Diabetes Father   . Hypertension Father   . Heart disease Maternal Grandmother   . Diabetes Maternal Grandmother   . Stroke Maternal Grandmother   . Hypertension Brother   . Hypertension Brother   . Hypertension Brother     Review of Systems: The review of systems is per the HPI.  All other systems were reviewed and are negative.  Physical Exam: BP 88/52  Pulse 78  Ht 5\' 6"  (1.676 m)  Wt 238 lb 4 oz (108.069 kg)  BMI 38.45 kg/m2 Patient is very pleasant and in no acute distress. Systolic BP by me is about 90.   She is obese. Skin is warm and dry. Color is normal.  HEENT is unremarkable. Normocephalic/atraumatic. PERRL. Sclera are nonicteric. Neck is supple. No masses. No JVD. Lungs are clear. Cardiac exam shows a regular rate and rhythm. Abdomen is soft. Extremities are without edema. Gait and ROM are intact. No gross neurologic deficits noted.  LABORATORY DATA:  Lab Results  Component Value Date   WBC 6.7 08/10/2011   HGB 14.3 08/07/2012   HCT 42.0 08/07/2012   PLT 202 08/10/2011   GLUCOSE 88 08/07/2012   ALT 9 05/09/2008   AST 13 05/09/2008   NA 140 08/07/2012   K 4.9 08/07/2012   CL 96 08/10/2011   CREATININE 11.89* 08/10/2011   BUN 48* 08/10/2011   CO2 24 08/10/2011   INR 1.11 08/07/2012   Echo Study Conclusions  - Left ventricle: The cavity size was normal. Wall thickness was increased in a pattern of mild LVH. Systolic function was vigorous. The estimated ejection fraction was in the range of 65% to 70%. Doppler parameters are consistent with abnormal left ventricular relaxation (grade 1 diastolic dysfunction). - Right atrium: Linear echodensity in RA, probably represents artifact.    Assessment / Plan:  1. Orthostatic hypotension, symptomatic - now on Midodrine. She has testing set up with Dr. Nehemiah Settle next Wednesday. She is feeling some better clinically. No syncope. BP is a little higher. I have elected to leave her on her current regimen. We certainly have room to increase the Midodrine if needed. We will see her back in about 3 weeks.   2. ESRD - on chronic hemodialysis.   3. Presumed adrenal insufficiency - has evaluation planned for next week.   Patient is agreeable to this plan and will call if any problems develop in the interim.

## 2012-08-21 NOTE — Patient Instructions (Addendum)
Stay on your current medicines  We need to see you in 3 weeks  Proceed on with your evaluation with Dr. Nehemiah Settle next week  Call the [facility] office at [phone] if you have any questions, problems or concerns.

## 2012-08-24 ENCOUNTER — Ambulatory Visit (INDEPENDENT_AMBULATORY_CARE_PROVIDER_SITE_OTHER): Payer: Medicare Other | Admitting: Endocrinology

## 2012-08-24 ENCOUNTER — Encounter: Payer: Self-pay | Admitting: Endocrinology

## 2012-08-24 VITALS — BP 92/52 | HR 98 | Temp 98.4°F | Ht 66.5 in | Wt 241.0 lb

## 2012-08-24 DIAGNOSIS — I959 Hypotension, unspecified: Secondary | ICD-10-CM

## 2012-08-24 DIAGNOSIS — R51 Headache: Secondary | ICD-10-CM

## 2012-08-24 DIAGNOSIS — N186 End stage renal disease: Secondary | ICD-10-CM

## 2012-08-24 MED ORDER — FLUDROCORTISONE ACETATE 0.1 MG PO TABS: 0.1000 mg | ORAL_TABLET | Freq: Every day | ORAL | Status: DC

## 2012-08-24 NOTE — Patient Instructions (Addendum)
i have sent a prescription to your pharmacy, for a pill to help the blood pressure In 3 days, reduce the prednisone to 1/2 of a 2.5 mg tab, daily Please come back for a follow-up appointment in 2 weeks. Our plan will be to try to taper off the prednisone, replace it wth the fludrocortisone, then test your cortisone.

## 2012-08-24 NOTE — Progress Notes (Signed)
Subjective:    Patient ID: Judy Green, female    DOB: 1966-08-11, 46 y.o.   MRN: [id number]  HPI Pt went on hemodialysis in 1995.  She received a renal transplant in 2000.  The transplanted kidney failed in 2007, due to an infection.   She has been back on hemodialysis since 2007.  She was on bp meds until 2010.  They were stopped then due to normalization of her BP.  She then developed hypotension, which has been persistent since then.  She has few years of constant moderate dizziness sensation in the head.  She has had assoc LOC twice--most recently approx 1 month ago.  Pt states she was first started on prednisone for her transplant, but she has been kept on it due to her hypotension.   Past Medical History  Diagnosis Date  . Chronic kidney disease     KIDNEY FAILURE  . ESRD (end stage renal disease)     Complications of transplanted kidney  . Rectal bleeding 2007    hemorrhoids/diverticular disease  . Retroperitoneal hematoma 9/07    transfusion  . H/O parathyroidectomy 1999  . UTI (lower urinary tract infection)   . Breast cancer     RT  . Adrenal insufficiency 2009  . Sleep apnea     does not have cpap at this time  . Anemia     hx of  . Oral contraceptive use     patient states has not had menstral cycle in 1 year.  . Hypertension     Dr. Renford Dills [phone]    Past Surgical History  Procedure Date  . Av fistula placement 05/08/2007    Right AVG  . Hero avg 12/14/2008    Left   . Hero 04/17/2009    Removed clotted Left Hero  2) New Right Hero placed and Stent to SVC  . Lumpectomy     left breast  . Exploration renal s/p transplantation   . Tubal ligation   . Avgg removal 08/07/2012    Procedure: REMOVAL OF ARTERIOVENOUS GORETEX GRAFT (AVGG);  Surgeon: Larina Earthly, MD;  Location: [facility] OR;  Service: Vascular;  Laterality: Right;    History   Social History  . Marital Status: Married    Spouse Name: N/A    Number of Children: 2  . Years of  Education: N/A   Occupational History  . customer service [employer]   Social History Main Topics  . Smoking status: Former Smoker    Quit date: 12/16/2002  . Smokeless tobacco: Not on file  . Alcohol Use: Yes     2-3x/week  . Drug Use: No  . Sexually Active: Not Currently   Other Topics Concern  . Not on file   Social History Narrative  . No narrative on file    Current Outpatient Prescriptions on File Prior to Visit  Medication Sig Dispense Refill  . aspirin EC 325 MG tablet Take 325 mg by mouth daily.      . Calcium Acetate 667 MG TABS Take 1,334 mg by mouth 3 (three) times daily with meals.       . clopidogrel (PLAVIX) 75 MG tablet Take 75 mg by mouth daily.        Marland Kitchen FOSRENOL 1000 MG chewable tablet Chew 1,000 mg by mouth 3 (three) times daily with meals.       . gabapentin (NEURONTIN) 100 MG capsule Take 100 mg by mouth 2 (two) times daily.       Marland Kitchen  JOLIVETTE 0.35 MG tablet Take 1 tablet by mouth daily.       . midodrine (PROAMATINE) 5 MG tablet Take 1 tablet (5 mg total) by mouth 3 (three) times daily.  270 tablet  3  . predniSONE (DELTASONE) 5 MG tablet Take 2.5 mg by mouth daily.       . SENSIPAR 30 MG tablet Take 30 mg by mouth 2 (two) times daily.        No Known Allergies  Family History  Problem Relation Age of Onset  . Diabetes Mother   . Diabetes Father   . Hypertension Father   . Heart disease Maternal Grandmother   . Diabetes Maternal Grandmother   . Stroke Maternal Grandmother   . Hypertension Brother   . Hypertension Brother   . Hypertension Brother     BP 92/52  Pulse 98  Temp 98.4 F (36.9 C) (Oral)  Ht 5' 6.5" (1.689 m)  Wt 241 lb (109.317 kg)  BMI 38.32 kg/m2  SpO2 95%  Review of Systems denies chest pain, rash, depression, visual loss, galactorrhea, change in facial appearance, rhinorrhea, and n/v.  She is anuric.  She has no menses.  She has intermittent headache, easy bruising, and weight gain.     Objective:   Physical  Exam VS: see vs page GEN: no distress HEAD: head: no deformity eyes: no periorbital swelling, no proptosis external nose and ears are normal mouth: no lesion seen NECK: supple, thyroid is not enlarged CHEST WALL: no deformity LUNGS:  Clear to auscultation BREASTS:  No mass.  No d/c CV: reg rate and rhythm, no murmur ABD: abdomen is soft, nontender.  no hepatosplenomegaly.  not distended.  no hernia MUSCULOSKELETAL: muscle bulk and strength are grossly normal.  no obvious joint swelling.  gait is normal and steady EXTEMITIES: no deformity.  no ulcer on the feet.  feet are of normal color and temp.  no edema.  She has several shunt scars on both UE's. PULSES: dorsalis pedis intact bilat.  no carotid bruit NEURO:  cn 2-12 grossly intact.   readily moves all 4's.  sensation is intact to touch on the feet SKIN:  Normal texture and temperature.  No rash or suspicious lesion is visible.   NODES:  None palpable at the neck PSYCH: alert, oriented x3.  Does not appear anxious nor depressed.    Assessment & Plan:  Hypotension, uncertain etiology.  i am asked to exclude adrenal etiology ESRD.  This complicates the clinical eval of hypotension, since bp fluctuations are so common in ESRD. Headache, uncertain if related to hypotension.

## 2012-08-26 ENCOUNTER — Encounter: Payer: Self-pay | Admitting: Endocrinology

## 2012-08-26 DIAGNOSIS — D649 Anemia, unspecified: Secondary | ICD-10-CM | POA: Insufficient documentation

## 2012-08-26 DIAGNOSIS — C50919 Malignant neoplasm of unspecified site of unspecified female breast: Secondary | ICD-10-CM | POA: Insufficient documentation

## 2012-09-03 ENCOUNTER — Emergency Department (HOSPITAL_COMMUNITY): Payer: Medicare Other

## 2012-09-03 ENCOUNTER — Inpatient Hospital Stay (HOSPITAL_COMMUNITY)
Admission: EM | Admit: 2012-09-03 | Discharge: 2012-09-06 | DRG: 312 | Disposition: A | Discharge status: Home / Self Care | Payer: Medicare Other | Attending: Internal Medicine | Admitting: Internal Medicine

## 2012-09-03 ENCOUNTER — Encounter (HOSPITAL_COMMUNITY): Payer: Self-pay | Admitting: *Deleted

## 2012-09-03 DIAGNOSIS — I959 Hypotension, unspecified: Secondary | ICD-10-CM

## 2012-09-03 DIAGNOSIS — N2581 Secondary hyperparathyroidism of renal origin: Secondary | ICD-10-CM | POA: Diagnosis present

## 2012-09-03 DIAGNOSIS — Z7902 Long term (current) use of antithrombotics/antiplatelets: Secondary | ICD-10-CM

## 2012-09-03 DIAGNOSIS — Z79899 Other long term (current) drug therapy: Secondary | ICD-10-CM

## 2012-09-03 DIAGNOSIS — D649 Anemia, unspecified: Secondary | ICD-10-CM

## 2012-09-03 DIAGNOSIS — D638 Anemia in other chronic diseases classified elsewhere: Secondary | ICD-10-CM | POA: Diagnosis present

## 2012-09-03 DIAGNOSIS — Z853 Personal history of malignant neoplasm of breast: Secondary | ICD-10-CM

## 2012-09-03 DIAGNOSIS — R55 Syncope and collapse: Principal | ICD-10-CM

## 2012-09-03 DIAGNOSIS — C50919 Malignant neoplasm of unspecified site of unspecified female breast: Secondary | ICD-10-CM

## 2012-09-03 DIAGNOSIS — I120 Hypertensive chronic kidney disease with stage 5 chronic kidney disease or end stage renal disease: Secondary | ICD-10-CM | POA: Diagnosis present

## 2012-09-03 DIAGNOSIS — N186 End stage renal disease: Secondary | ICD-10-CM

## 2012-09-03 DIAGNOSIS — IMO0002 Reserved for concepts with insufficient information to code with codable children: Secondary | ICD-10-CM

## 2012-09-03 DIAGNOSIS — Z7982 Long term (current) use of aspirin: Secondary | ICD-10-CM

## 2012-09-03 DIAGNOSIS — G4730 Sleep apnea, unspecified: Secondary | ICD-10-CM | POA: Diagnosis present

## 2012-09-03 DIAGNOSIS — E2749 Other adrenocortical insufficiency: Secondary | ICD-10-CM | POA: Diagnosis present

## 2012-09-03 DIAGNOSIS — R000 Tachycardia, unspecified: Secondary | ICD-10-CM

## 2012-09-03 DIAGNOSIS — Z992 Dependence on renal dialysis: Secondary | ICD-10-CM

## 2012-09-03 LAB — CBC WITH DIFFERENTIAL/PLATELET
Eosinophils Absolute: 0.2 10*3/uL (ref 0.0–0.7)
Eosinophils Relative: 2 % (ref 0–5)
Lymphs Abs: 1.7 10*3/uL (ref 0.7–4.0)
MCH: 30.7 pg (ref 26.0–34.0)
MCV: 97 fL (ref 78.0–100.0)
Monocytes Absolute: 0.8 10*3/uL (ref 0.1–1.0)
Platelets: 215 10*3/uL (ref 150–400)
RBC: 3.94 MIL/uL (ref 3.87–5.11)

## 2012-09-03 LAB — COMPREHENSIVE METABOLIC PANEL
ALT: 15 U/L (ref 0–35)
BUN: 18 mg/dL (ref 6–23)
Calcium: 10.9 mg/dL — ABNORMAL HIGH (ref 8.4–10.5)
Creatinine, Ser: 7.18 mg/dL — ABNORMAL HIGH (ref 0.50–1.10)
GFR calc Af Amer: 7 mL/min — ABNORMAL LOW (ref >90)
Glucose, Bld: 119 mg/dL — ABNORMAL HIGH (ref 70–99)
Sodium: 137 mEq/L (ref 135–145)
Total Protein: 8.8 g/dL — ABNORMAL HIGH (ref 6.0–8.3)

## 2012-09-03 LAB — LIPID PANEL
Cholesterol: 236 mg/dL — ABNORMAL HIGH (ref 0–200)
Triglycerides: 257 mg/dL — ABNORMAL HIGH (ref <150)

## 2012-09-03 MED ORDER — ASPIRIN EC 325 MG PO TBEC
325.0000 mg | DELAYED_RELEASE_TABLET | Freq: Every day | ORAL | Status: DC
  Administered 2012-09-03 – 2012-09-05 (×3): 325 mg via ORAL
  Filled 2012-09-03 (×5): qty 1

## 2012-09-03 MED ORDER — CLOPIDOGREL BISULFATE 75 MG PO TABS
75.0000 mg | ORAL_TABLET | Freq: Every day | ORAL | Status: DC
  Administered 2012-09-03 – 2012-09-05 (×3): 75 mg via ORAL
  Filled 2012-09-03 (×4): qty 1

## 2012-09-03 MED ORDER — CALCIUM ACETATE 667 MG PO CAPS
1334.0000 mg | ORAL_CAPSULE | Freq: Three times a day (TID) | ORAL | Status: DC
  Administered 2012-09-04 – 2012-09-06 (×7): 1334 mg via ORAL
  Filled 2012-09-03 (×10): qty 2

## 2012-09-03 MED ORDER — LANTHANUM CARBONATE 500 MG PO CHEW
1000.0000 mg | CHEWABLE_TABLET | Freq: Three times a day (TID) | ORAL | Status: DC
Start: 2012-09-04 — End: 2012-09-06
  Administered 2012-09-04 – 2012-09-06 (×6): 1000 mg via ORAL
  Filled 2012-09-03 (×10): qty 2

## 2012-09-03 MED ORDER — MIDODRINE HCL 5 MG PO TABS
5.0000 mg | ORAL_TABLET | Freq: Three times a day (TID) | ORAL | Status: DC
  Administered 2012-09-03 – 2012-09-04 (×2): 5 mg via ORAL
  Filled 2012-09-03 (×4): qty 1

## 2012-09-03 MED ORDER — ONDANSETRON HCL 4 MG/2ML IJ SOLN
4.0000 mg | Freq: Four times a day (QID) | INTRAMUSCULAR | Status: DC | PRN
  Administered 2012-09-04: 4 mg via INTRAVENOUS
  Filled 2012-09-03: qty 2

## 2012-09-03 MED ORDER — SODIUM CHLORIDE 0.9 % IV SOLN: INTRAVENOUS | Status: DC

## 2012-09-03 MED ORDER — CINACALCET HCL 30 MG PO TABS
30.0000 mg | ORAL_TABLET | Freq: Two times a day (BID) | ORAL | Status: DC
  Administered 2012-09-03 – 2012-09-05 (×5): 30 mg via ORAL
  Filled 2012-09-03 (×7): qty 1

## 2012-09-03 MED ORDER — CALCIUM ACETATE 667 MG PO TABS: 1334.0000 mg | ORAL_TABLET | Freq: Three times a day (TID) | ORAL | Status: DC

## 2012-09-03 MED ORDER — SODIUM CHLORIDE 0.9 % IJ SOLN
3.0000 mL | Freq: Two times a day (BID) | INTRAMUSCULAR | Status: DC
  Administered 2012-09-03 – 2012-09-05 (×2): 3 mL via INTRAVENOUS

## 2012-09-03 MED ORDER — ONDANSETRON HCL 4 MG PO TABS: 4.0000 mg | ORAL_TABLET | Freq: Four times a day (QID) | ORAL | Status: DC | PRN

## 2012-09-03 MED ORDER — GABAPENTIN 100 MG PO CAPS
100.0000 mg | ORAL_CAPSULE | Freq: Two times a day (BID) | ORAL | Status: DC
  Administered 2012-09-03 – 2012-09-05 (×5): 100 mg via ORAL
  Filled 2012-09-03 (×7): qty 1

## 2012-09-03 MED ORDER — MORPHINE SULFATE 4 MG/ML IJ SOLN
4.0000 mg | Freq: Once | INTRAMUSCULAR | Status: AC
  Administered 2012-09-03: 4 mg via INTRAVENOUS
  Filled 2012-09-03: qty 1

## 2012-09-03 MED ORDER — FLUDROCORTISONE ACETATE 0.1 MG PO TABS
0.1000 mg | ORAL_TABLET | Freq: Every day | ORAL | Status: DC
  Administered 2012-09-03 – 2012-09-05 (×3): 0.1 mg via ORAL
  Filled 2012-09-03 (×4): qty 1

## 2012-09-03 MED ORDER — PREDNISONE 2.5 MG PO TABS
1.2500 mg | ORAL_TABLET | Freq: Every day | ORAL | Status: DC
  Administered 2012-09-03: 1.25 mg via ORAL
  Administered 2012-09-04: 11:00:00 via ORAL
  Administered 2012-09-05: 2.5 mg via ORAL
  Filled 2012-09-03 (×4): qty 0.5

## 2012-09-03 MED ORDER — NORETHINDRONE 0.35 MG PO TABS: 1.0000 | ORAL_TABLET | Freq: Every day | ORAL | Status: DC

## 2012-09-03 MED ORDER — HYDROCODONE-ACETAMINOPHEN 5-325 MG PO TABS
1.0000 | ORAL_TABLET | ORAL | Status: DC | PRN
  Administered 2012-09-03 – 2012-09-05 (×5): 2 via ORAL
  Filled 2012-09-03 (×5): qty 2

## 2012-09-03 MED ORDER — SODIUM CHLORIDE 0.9 % IV BOLUS (SEPSIS)
500.0000 mL | Freq: Once | INTRAVENOUS | Status: AC
  Administered 2012-09-03: 500 mL via INTRAVENOUS

## 2012-09-03 MED ORDER — ONDANSETRON HCL 4 MG/2ML IJ SOLN
4.0000 mg | Freq: Once | INTRAMUSCULAR | Status: AC
  Administered 2012-09-03: 4 mg via INTRAVENOUS
  Filled 2012-09-03: qty 2

## 2012-09-03 MED ORDER — ENOXAPARIN SODIUM 30 MG/0.3ML ~~LOC~~ SOLN
30.0000 mg | SUBCUTANEOUS | Status: DC
  Administered 2012-09-03 – 2012-09-05 (×3): 30 mg via SUBCUTANEOUS
  Filled 2012-09-03 (×5): qty 0.3

## 2012-09-03 MED ORDER — NORETHINDRONE 0.35 MG PO TABS
1.0000 | ORAL_TABLET | Freq: Every day | ORAL | Status: DC
  Administered 2012-09-04: 0.35 mg via ORAL

## 2012-09-03 NOTE — H&P (Signed)
[facility] History and Physical  Judy Green ZOX:[id number] DOB: 08-Aug-1966 DOA: 09/03/2012  Referring physician: ED physician PCP: Katy Apo, MD   Chief Complaint: Syncopal event  HPI:  Pt is 45 yo female with ESRD on HD TTS, presents today for evaluation of syncopal episode she has sustained today after HD treatment. She reports similar episodes in the past after HD. She denies any chest pain or shortness of breath, no known prodromal events. She also denies any headaches or visual changes, no numbness or tingling, no specific focal neurologic weakness.  Assessment and Plan:  Syncopal event - unclear etiology - will admit to telemetry floor for further evaluation - will also check orthostatic vitals and will monitor BP per floor protocol - will obtain TSH and UDS if possible - may need PT evaluation - check CE's and 12 lead EKG  ESRD - on HD - will notify nephrology team  Code Status: Full Family Communication: Pt at bedside Disposition Plan: PT evaluation    Review of Systems:  Constitutional: Negative for fever, chills and malaise/fatigue. Negative for diaphoresis.  HENT: Negative for hearing loss, ear pain, nosebleeds, congestion, sore throat, neck pain, tinnitus and ear discharge.   Eyes: Negative for blurred vision, double vision, photophobia, pain, discharge and redness.  Respiratory: Negative for cough, hemoptysis, sputum production, shortness of breath, wheezing and stridor.   Cardiovascular: Negative for chest pain, palpitations, orthopnea, claudication and leg swelling.  Gastrointestinal: Negative for nausea, vomiting and abdominal pain. Negative for heartburn, constipation, blood in stool and melena.  Genitourinary: Negative for dysuria, urgency, frequency, hematuria and flank pain.  Musculoskeletal: Negative for myalgias, back pain, joint pain and falls.  Skin: Negative for itching and rash.  Neurological: Negative for dizziness and  weakness. Negative for tingling, tremors, sensory change, speech change, focal weakness, loss of consciousness and headaches.  Endo/Heme/Allergies: Negative for environmental allergies and polydipsia. Does not bruise/bleed easily.  Psychiatric/Behavioral: Negative for suicidal ideas. The patient is not nervous/anxious.      Past Medical History  Diagnosis Date  . Chronic kidney disease     KIDNEY FAILURE  . ESRD (end stage renal disease)     Complications of transplanted kidney  . Rectal bleeding 2007    hemorrhoids/diverticular disease  . Retroperitoneal hematoma 9/07    transfusion  . H/O parathyroidectomy 1999  . UTI (lower urinary tract infection)   . Adrenal insufficiency 2009  . Sleep apnea     does not have cpap at this time  . Oral contraceptive use     patient states has not had menstral cycle in 1 year.  . Hypertension     Dr. Renford Dills [phone]  . Chronic headaches   . H/O syncope   . Breast cancer     RT  . Anemia     hx of  . Renal insufficiency     Past Surgical History  Procedure Date  . Av fistula placement 05/08/2007    Right AVG  . Hero avg 12/14/2008    Left   . Hero 04/17/2009    Removed clotted Left Hero  2) New Right Hero placed and Stent to SVC  . Lumpectomy     left breast  . Exploration renal s/p transplantation   . Tubal ligation   . Avgg removal 08/07/2012    Procedure: REMOVAL OF ARTERIOVENOUS GORETEX GRAFT (AVGG);  Surgeon: Larina Earthly, MD;  Location: [facility] OR;  Service: Vascular;  Laterality: Right;    Social History:  reports that she quit smoking about 9 years ago. She has never used smokeless tobacco. She reports that she drinks alcohol. She reports that she does not use illicit drugs.  No Known Allergies  Family History  Problem Relation Age of Onset  . Diabetes Mother   . Diabetes Father   . Hypertension Father   . Heart disease Maternal Grandmother   . Diabetes Maternal Grandmother   . Stroke Maternal Grandmother   .  Hypertension Brother   . Hypertension Brother   . Hypertension Brother   . Cancer Other     Prostate Cancer-Uncle, Colon Cancer-Uncle    Prior to Admission medications   Medication Sig Start Date End Date Taking? Authorizing Provider  aspirin EC 325 MG tablet Take 325 mg by mouth daily.   Yes Historical Provider, MD  Calcium Acetate 667 MG TABS Take 1,334 mg by mouth 3 (three) times daily with meals.    Yes Historical Provider, MD  clopidogrel (PLAVIX) 75 MG tablet Take 75 mg by mouth daily.     Yes Historical Provider, MD  fludrocortisone (FLORINEF) 0.1 MG tablet Take 1 tablet (0.1 mg total) by mouth daily. 08/24/12 08/24/13 Yes Romero Belling, MD  FOSRENOL 1000 MG chewable tablet Chew 1,000 mg by mouth 3 (three) times daily with meals.  04/29/12  Yes Historical Provider, MD  gabapentin (NEURONTIN) 100 MG capsule Take 100 mg by mouth 2 (two) times daily.  06/16/12  Yes Historical Provider, MD  JOLIVETTE 0.35 MG tablet Take 1 tablet by mouth daily.  07/20/12  Yes Historical Provider, MD  midodrine (PROAMATINE) 5 MG tablet Take 1 tablet (5 mg total) by mouth 3 (three) times daily. 08/21/12 09/20/12 Yes Rosalio Macadamia, NP  Multiple Vitamins-Calcium (ONE-A-DAY WOMENS PO) Take 1 tablet by mouth daily.   Yes Historical Provider, MD  predniSONE (DELTASONE) 2.5 MG tablet Take 1.25 mg by mouth daily.    Yes Historical Provider, MD  SENSIPAR 30 MG tablet Take 30 mg by mouth 2 (two) times daily.  07/08/12  Yes Historical Provider, MD    Physical Exam: Filed Vitals:   09/03/12 1622 09/03/12 1715 09/03/12 1720  BP: 89/53 94/60 94/60   Pulse: 108 84   Temp: 98 F (36.7 C) 97.2 F (36.2 C) 97.2 F (36.2 C)  TempSrc: Oral  Oral  Resp: 20  18  SpO2: 100%      Physical Exam  Constitutional: Appears well-developed and well-nourished. No distress.  HENT: Normocephalic. External right and left ear normal. Oropharynx is clear and moist.  Eyes: Conjunctivae and EOM are normal. PERRLA, no scleral icterus.  Neck:  Normal ROM. Neck supple. No JVD. No tracheal deviation. No thyromegaly.  CVS: Regular rhythm, tachycardic, S1/S2 +, no murmurs, no gallops, no carotid bruit.  Pulmonary: Effort and breath sounds normal, no stridor, rhonchi, wheezes, rales.  Abdominal: Soft. BS +,  no distension, tenderness, rebound or guarding.  Musculoskeletal: Normal range of motion. No edema and no tenderness.  Lymphadenopathy: No lymphadenopathy noted, cervical, inguinal. Neuro: Alert. Normal reflexes, muscle tone coordination. No cranial nerve deficit. Skin: Skin is warm and dry. No rash noted. Not diaphoretic. No erythema. No pallor.  Psychiatric: Normal mood and affect. Behavior, judgment, thought content normal.   Labs on Admission:  Basic Metabolic Panel:  Lab 09/03/12 1610  NA 137  K 3.6  CL 92*  CO2 32  GLUCOSE 119*  BUN 18  CREATININE 7.18*  CALCIUM 10.9*  MG --  PHOS --   Liver Function Tests:  Lab 09/03/12 1706  AST 20  ALT 15  ALKPHOS 60  BILITOT 0.3  PROT 8.8*  ALBUMIN 4.5   CBC:  Lab 09/03/12 1706  WBC 8.4  NEUTROABS 5.7  HGB 12.1  HCT 38.2  MCV 97.0  PLT 215    Radiological Exams on Admission: Ct Head Wo Contrast  09/03/2012  *RADIOLOGY REPORT*  Clinical Data: Hypotension.  Syncope.  Dialysis today.  Fall with facial abrasions.  Headache and neck pain.  CT HEAD WITHOUT CONTRAST  Technique:  Contiguous axial images were obtained from the base of the skull through the vertex without contrast.  Comparison: 08/10/2011  Findings: The brain stem, cerebellum, cerebral peduncles, thalami, basal ganglia, basilar cisterns, and ventricular system appear unremarkable.  No intracranial hemorrhage, mass lesion, or acute infarction is identified.  IMPRESSION:  No significant abnormality identified.   Original Report Authenticated By: Dellia Cloud, M.D.     EKG: Normal sinus rhythm, no ST/T wave changes  Debbora Presto, MD  [facility] Pager [phone]  If 7PM-7AM,  please contact night-coverage [email or web address] Password [facility] 09/03/2012, 6:48 PM

## 2012-09-03 NOTE — ED Provider Notes (Addendum)
History     CSN: [id number]  Arrival date & time 09/03/12  1616   First MD Initiated Contact with Patient 09/03/12 1657      Chief Complaint  Patient presents with  . Fall    (Consider location/radiation/quality/duration/timing/severity/associated sxs/prior treatment) Patient is a 46 y.o. female presenting with fall. The history is provided by the patient and the spouse.  Fall Pertinent negatives include no fever, no abdominal pain, no nausea and no vomiting.   46 year old, female, who gets dialysis on Tuesday, Thursday, Saturday, presents to emergency department after having a syncopal episode.  Following her dialysis session today.  She says after dialysis.  She got up, became, lightheaded, and fell forward, striking her face.  She says her entire body hurts.  She denies a headache, nausea, vomiting vision changes.  She denies neck pain.  She does not have weakness, or paresthesias.  She denies a cough, or shortness of breath.  She has not been sick over the past few, days.  However, she does say that she has been having frequent syncopal episodes.   She states that today.  She only had 3 kg of fluid taken off, which is less than the usual amount  Past Medical History  Diagnosis Date  . Chronic kidney disease     KIDNEY FAILURE  . ESRD (end stage renal disease)     Complications of transplanted kidney  . Rectal bleeding 2007    hemorrhoids/diverticular disease  . Retroperitoneal hematoma 9/07    transfusion  . H/O parathyroidectomy 1999  . UTI (lower urinary tract infection)   . Adrenal insufficiency 2009  . Sleep apnea     does not have cpap at this time  . Oral contraceptive use     patient states has not had menstral cycle in 1 year.  . Hypertension     Dr. Renford Dills [phone]  . Chronic headaches   . H/O syncope   . Breast cancer     RT  . Anemia     hx of    Past Surgical History  Procedure Date  . Av fistula placement 05/08/2007    Right AVG  . Hero  avg 12/14/2008    Left   . Hero 04/17/2009    Removed clotted Left Hero  2) New Right Hero placed and Stent to SVC  . Lumpectomy     left breast  . Exploration renal s/p transplantation   . Tubal ligation   . Avgg removal 08/07/2012    Procedure: REMOVAL OF ARTERIOVENOUS GORETEX GRAFT (AVGG);  Surgeon: Larina Earthly, MD;  Location: [facility] OR;  Service: Vascular;  Laterality: Right;    Family History  Problem Relation Age of Onset  . Diabetes Mother   . Diabetes Father   . Hypertension Father   . Heart disease Maternal Grandmother   . Diabetes Maternal Grandmother   . Stroke Maternal Grandmother   . Hypertension Brother   . Hypertension Brother   . Hypertension Brother   . Cancer Other     Prostate Cancer-Uncle, Colon Cancer-Uncle    History  Substance Use Topics  . Smoking status: Former Smoker    Quit date: 12/16/2002  . Smokeless tobacco: Never Used  . Alcohol Use: Yes     2-3x/week    OB History    Grav Para Term Preterm Abortions TAB SAB Ect Mult Living                  Review of  Systems  Constitutional: Negative for fever.  HENT: Negative for neck pain.   Eyes: Negative for visual disturbance.  Respiratory: Negative for cough, chest tightness and shortness of breath.   Cardiovascular: Negative for chest pain and palpitations.  Gastrointestinal: Negative for nausea, vomiting and abdominal pain.  Musculoskeletal: Negative for back pain.  Skin: Positive for wound.       Abrasions to face  Neurological: Positive for syncope and light-headedness.  Hematological: Does not bruise/bleed easily.  Psychiatric/Behavioral: Negative for confusion.  All other systems reviewed and are negative.    Allergies  Review of patient's allergies indicates no known allergies.  Home Medications   Current Outpatient Rx  Name Route Sig Dispense Refill  . ASPIRIN EC 325 MG PO TBEC Oral Take 325 mg by mouth daily.    Marland Kitchen CALCIUM ACETATE 667 MG PO TABS Oral Take 1,334 mg by mouth 3  (three) times daily with meals.     . CLOPIDOGREL BISULFATE 75 MG PO TABS Oral Take 75 mg by mouth daily.      Marland Kitchen FLUDROCORTISONE ACETATE 0.1 MG PO TABS Oral Take 1 tablet (0.1 mg total) by mouth daily. 30 tablet 2  . FOSRENOL 1000 MG PO CHEW Oral Chew 1,000 mg by mouth 3 (three) times daily with meals.     Marland Kitchen GABAPENTIN 100 MG PO CAPS Oral Take 100 mg by mouth 2 (two) times daily.     Marland Kitchen JOLIVETTE 0.35 MG PO TABS Oral Take 1 tablet by mouth daily.     Marland Kitchen MIDODRINE HCL 5 MG PO TABS Oral Take 1 tablet (5 mg total) by mouth 3 (three) times daily. 270 tablet 3  . ONE-A-DAY WOMENS PO Oral Take 1 tablet by mouth daily.    Marland Kitchen PREDNISONE 2.5 MG PO TABS  1/2 tab daily    . SENSIPAR 30 MG PO TABS Oral Take 30 mg by mouth 2 (two) times daily.       BP 94/60  Pulse 84  Temp 97.2 F (36.2 C) (Oral)  Resp 18  SpO2 100%  Physical Exam  Nursing note and vitals reviewed. Constitutional: She is oriented to person, place, and time. No distress.       Morbidly obese  HENT:  Head: Normocephalic.       Facial abrasions on nose and upper lip  Eyes: Conjunctivae normal and EOM are normal.  Neck: Normal range of motion. Neck supple.       No cspine ttp  Cardiovascular: Regular rhythm and intact distal pulses.   No murmur heard.      tachcycardia  Pulmonary/Chest: Effort normal and breath sounds normal. She has no rales.  Abdominal: Soft. She exhibits no distension. There is no tenderness.  Musculoskeletal: Normal range of motion. She exhibits no edema.  Neurological: She is alert and oriented to person, place, and time. No cranial nerve deficit.  Skin: Skin is warm and dry.       Facial skin abrasions  Psychiatric: She has a normal mood and affect. Thought content normal.    ED Course  Procedures (including critical care time)   Labs Reviewed  CBC WITH DIFFERENTIAL  COMPREHENSIVE METABOLIC PANEL   No results found.   No diagnosis found.  ECG Sinus tachycardia at 110 beats per  minute. Normal axis. Normal intervals. Nonspecific T-wave changes   Spoke with Dr. Izola Price.  She will admit for eval of recurrent syncope.   MDM  Syncope after dialysis but occurring more frequently even when she has  not had dialysis.        Cheri Guppy, MD 09/03/12 1914  Cheri Guppy, MD 09/03/12 Zollie Pee

## 2012-09-03 NOTE — ED Notes (Signed)
The pt has had these passing out spells for 3 months and they usually occur after dialysis.  Her meds have been changed around but the  Episodes continue to occur

## 2012-09-03 NOTE — ED Notes (Signed)
The pt has been passing out form a drop in her bp.  She was dialyzed today she finished at 1130.  She passed out pta here..  She fell face forward with abrasions to her nose and she has cuts to her upper and lower lips.  She has a headache and neck pain.  .  She has a graft in her upper rt arm

## 2012-09-03 NOTE — ED Notes (Signed)
Attempted to call report - nurse unavailable at this time - will call back in 10 min

## 2012-09-04 DIAGNOSIS — I959 Hypotension, unspecified: Secondary | ICD-10-CM

## 2012-09-04 LAB — HEMOGLOBIN A1C
Hgb A1c MFr Bld: 5.3 % (ref <5.7)
Mean Plasma Glucose: 105 mg/dL (ref <117)

## 2012-09-04 LAB — CBC
HCT: 32.2 % — ABNORMAL LOW (ref 36.0–46.0)
MCV: 99.1 fL (ref 78.0–100.0)
RDW: 13.8 % (ref 11.5–15.5)
WBC: 6.9 10*3/uL (ref 4.0–10.5)

## 2012-09-04 LAB — BASIC METABOLIC PANEL
BUN: 30 mg/dL — ABNORMAL HIGH (ref 6–23)
CO2: 33 mEq/L — ABNORMAL HIGH (ref 19–32)
Chloride: 98 mEq/L (ref 96–112)
Creatinine, Ser: 9.21 mg/dL — ABNORMAL HIGH (ref 0.50–1.10)
Glucose, Bld: 114 mg/dL — ABNORMAL HIGH (ref 70–99)

## 2012-09-04 LAB — TROPONIN I: Troponin I: <0.3 ng/mL (ref <0.30)

## 2012-09-04 MED ORDER — SODIUM CHLORIDE 0.9 % IV BOLUS (SEPSIS)
250.0000 mL | Freq: Once | INTRAVENOUS | Status: AC
  Administered 2012-09-04: 250 mL via INTRAVENOUS

## 2012-09-04 MED ORDER — DARBEPOETIN ALFA-POLYSORBATE 40 MCG/0.4ML IJ SOLN
40.0000 ug | INTRAMUSCULAR | Status: DC
  Administered 2012-09-05: 40 ug via SUBCUTANEOUS
  Filled 2012-09-04: qty 0.4

## 2012-09-04 MED ORDER — MIDODRINE HCL 5 MG PO TABS
10.0000 mg | ORAL_TABLET | Freq: Three times a day (TID) | ORAL | Status: DC
  Administered 2012-09-04 – 2012-09-05 (×5): 10 mg via ORAL
  Filled 2012-09-04 (×9): qty 2

## 2012-09-04 MED ORDER — PARICALCITOL 5 MCG/ML IV SOLN
7.0000 ug | INTRAVENOUS | Status: DC
  Administered 2012-09-05: 7 ug via INTRAVENOUS
  Filled 2012-09-04: qty 1.4

## 2012-09-04 NOTE — Consult Note (Signed)
[facility] Renal Consultation Note  Indication for Consultation:  Management of ESRD/hemodialysis; anemia, hypertension/volume and secondary hyperparathyroidism  HPI: Judy Green is a 46 y.o. female admitted by [facility] last night after syncopal episode at home.  She completed her full Hemodialysis treatment at [facility] without problems , only gained 2.8 kg with a goal of 3.2 kg. Pre hd bp=  146/81 and post  105/49 with 2.8 kg uf. Also I saw her on rounds and she was asymptomatic when seen with a BP of 84/42.  She reports passing out reaching for her cup of drink for supper last night.      Of significant note; Dr. Everardo All recently 08/24/12 saw in his office  for wu of low bp with syncope on Prednisone 2.5 mg qday.   She was on Prednisone since her Kidney Transplant with  rejection 2002. She received a diagnosis of Adrenal Insufficiency  2009 with hypotension and low dose Steroids have been on board since transplant failure.   Most recent dosage prior to evaluation by Dr. Everardo All (08/16/12) was 2.5 mg/day.She reports syncopal falls for 2 months increasing over the past month.  Dr. Everardo All tapered her Prednisone 2.5mg  to half a pill (1.25 mg/day) and patient has been on Midodrine 5 mg/day to help bp support and "was to start Florinef 0.1mg  q day when tapering off prednisone.   No reported fevers, chills, sob, chest pain , palpitations, vision problems or problems using her  Hero Dialysis access.  She has also had a cardiology workup by [place] where echo showed mild LVH, normal systolic function.  Midodrine was added after echo (echo done 8/16)  The other issue with BP is technique of measurement as can only get BP readings in leg  (HERO access right arm, inaccurate in left d/t many old failed accesses)  Past Medical History  Diagnosis Date  . Chronic kidney disease     KIDNEY FAILURE  . ESRD (end stage renal disease)     Complications of transplanted kidney    . Rectal bleeding 2007    hemorrhoids/diverticular disease  . Retroperitoneal hematoma 9/07    transfusion  . H/O parathyroidectomy 1999  . UTI (lower urinary tract infection)   . Adrenal insufficiency 2009  . Sleep apnea     does not have cpap at this time  . Oral contraceptive use     patient states has not had menstral cycle in 1 year.  . Hypertension     Dr. Renford Dills [phone]  . Chronic headaches   . H/O syncope   . Breast cancer     RT  . Anemia     hx of  . Renal insufficiency     Past Surgical History  Procedure Date  . Av fistula placement 05/08/2007    Right AVG  . Hero avg 12/14/2008    Left   . Hero 04/17/2009    Removed clotted Left Hero  2) New Right Hero placed and Stent to SVC  . Lumpectomy     left breast  . Exploration renal s/p transplantation   . Tubal ligation   . Avgg removal 08/07/2012    Procedure: REMOVAL OF ARTERIOVENOUS GORETEX GRAFT (AVGG);  Surgeon: Larina Earthly, MD;  Location: [facility] OR;  Service: Vascular;  Laterality: Right;      Family History  Problem Relation Age of Onset  . Diabetes Mother   . Diabetes Father   . Hypertension Father   . Heart  disease Maternal Grandmother   . Diabetes Maternal Grandmother   . Stroke Maternal Grandmother   . Hypertension Brother   . Hypertension Brother   . Hypertension Brother   . Cancer Other     Prostate Cancer-Uncle, Colon Cancer-Uncle   Social + Lives alone, works at home for [employer], 2 daughters, quit smoking 9 years ago, denies illicit drugs and    No Known Allergies  Prior to Admission medications   Medication Sig Start Date End Date Taking? Authorizing Provider  aspirin EC 325 MG tablet Take 325 mg by mouth daily.   Yes Historical Provider, MD  Calcium Acetate 667 MG TABS Take 1,334 mg by mouth 3 (three) times daily with meals.    Yes Historical Provider, MD  clopidogrel (PLAVIX) 75 MG tablet Take 75 mg by mouth daily.     Yes Historical Provider, MD  fludrocortisone  (FLORINEF) 0.1 MG tablet Take 1 tablet (0.1 mg total) by mouth daily. 08/24/12 08/24/13 Yes Romero Belling, MD  FOSRENOL 1000 MG chewable tablet Chew 1,000 mg by mouth 3 (three) times daily with meals.  04/29/12  Yes Historical Provider, MD  gabapentin (NEURONTIN) 100 MG capsule Take 100 mg by mouth 2 (two) times daily.  06/16/12  Yes Historical Provider, MD  JOLIVETTE 0.35 MG tablet Take 1 tablet by mouth daily.  07/20/12  Yes Historical Provider, MD  midodrine (PROAMATINE) 5 MG tablet Take 1 tablet (5 mg total) by mouth 3 (three) times daily. 08/21/12 09/20/12 Yes Rosalio Macadamia, NP  Multiple Vitamins-Calcium (ONE-A-DAY WOMENS PO) Take 1 tablet by mouth daily.   Yes Historical Provider, MD  predniSONE (DELTASONE) 2.5 MG tablet Take 1.25 mg by mouth daily.    Yes Historical Provider, MD  SENSIPAR 30 MG tablet Take 30 mg by mouth 2 (two) times daily.  07/08/12  Yes Historical Provider, MD    [id number], ondansetron (ZOFRAN) IV, ondansetron  Results for orders placed during the hospital encounter of 09/03/12 (from the past 48 hour(s))  CBC WITH DIFFERENTIAL     Status: Normal   Collection Time   09/03/12  5:06 PM      Component Value Range Comment   WBC 8.4  4.0 - 10.5 K/uL    RBC 3.94  3.87 - 5.11 MIL/uL    Hemoglobin 12.1  12.0 - 15.0 g/dL    HCT 95.2  84.1 - 32.4 %    MCV 97.0  78.0 - 100.0 fL    MCH 30.7  26.0 - 34.0 pg    MCHC 31.7  30.0 - 36.0 g/dL    RDW 40.1  02.7 - 25.3 %    Platelets 215  150 - 400 K/uL    Neutrophils Relative 68  43 - 77 %    Neutro Abs 5.7  1.7 - 7.7 K/uL    Lymphocytes Relative 20  12 - 46 %    Lymphs Abs 1.7  0.7 - 4.0 K/uL    Monocytes Relative 10  3 - 12 %    Monocytes Absolute 0.8  0.1 - 1.0 K/uL    Eosinophils Relative 2  0 - 5 %    Eosinophils Absolute 0.2  0.0 - 0.7 K/uL    Basophils Relative 1  0 - 1 %    Basophils Absolute 0.0  0.0 - 0.1 K/uL   COMPREHENSIVE METABOLIC PANEL     Status: Abnormal   Collection Time   09/03/12  5:06 PM       Component  Value Range Comment   Sodium 137  135 - 145 mEq/L    Potassium 3.6  3.5 - 5.1 mEq/L    Chloride 92 (*) 96 - 112 mEq/L    CO2 32  19 - 32 mEq/L    Glucose, Bld 119 (*) 70 - 99 mg/dL    BUN 18  6 - 23 mg/dL    Creatinine, Ser 1.61 (*) 0.50 - 1.10 mg/dL    Calcium 09.6 (*) 8.4 - 10.5 mg/dL    Total Protein 8.8 (*) 6.0 - 8.3 g/dL    Albumin 4.5  3.5 - 5.2 g/dL    AST 20  0 - 37 U/L    ALT 15  0 - 35 U/L    Alkaline Phosphatase 60  39 - 117 U/L    Total Bilirubin 0.3  0.3 - 1.2 mg/dL    GFR calc non Af Amer 6 (*) >90 mL/min    GFR calc Af Amer 7 (*) >90 mL/min   PHOSPHORUS     Status: Normal   Collection Time   09/03/12  7:00 PM      Component Value Range Comment   Phosphorus 3.1  2.3 - 4.6 mg/dL   MAGNESIUM     Status: Normal   Collection Time   09/03/12  7:00 PM      Component Value Range Comment   Magnesium 2.4  1.5 - 2.5 mg/dL   TSH     Status: Normal   Collection Time   09/03/12  7:00 PM      Component Value Range Comment   TSH 1.513  0.350 - 4.500 uIU/mL   HEMOGLOBIN A1C     Status: Normal   Collection Time   09/03/12  7:00 PM      Component Value Range Comment   Hemoglobin A1C 5.3  <5.7 %    Mean Plasma Glucose 105  <117 mg/dL   TROPONIN I     Status: Normal   Collection Time   09/03/12  7:10 PM      Component Value Range Comment   Troponin I <0.30  <0.30 ng/mL   LIPID PANEL     Status: Abnormal   Collection Time   09/03/12  7:11 PM      Component Value Range Comment   Cholesterol 236 (*) 0 - 200 mg/dL    Triglycerides 045 (*) <150 mg/dL    HDL 60  >40 mg/dL    Total CHOL/HDL Ratio 3.9      VLDL 51 (*) 0 - 40 mg/dL    LDL Cholesterol 981 (*) 0 - 99 mg/dL   TROPONIN I     Status: Normal   Collection Time   09/04/12  1:15 AM      Component Value Range Comment   Troponin I <0.30  <0.30 ng/mL   TROPONIN I     Status: Normal   Collection Time   09/04/12  6:52 AM      Component Value Range Comment   Troponin I <0.30  <0.30 ng/mL   BASIC METABOLIC PANEL      Status: Abnormal   Collection Time   09/04/12  6:52 AM      Component Value Range Comment   Sodium 142  135 - 145 mEq/L    Potassium 4.2  3.5 - 5.1 mEq/L    Chloride 98  96 - 112 mEq/L    CO2 33 (*) 19 - 32 mEq/L    Glucose, Bld 114 (*)  70 - 99 mg/dL    BUN 30 (*) 6 - 23 mg/dL DELTA CHECK NOTED   Creatinine, Ser 9.21 (*) 0.50 - 1.10 mg/dL    Calcium 9.7  8.4 - 40.9 mg/dL    GFR calc non Af Amer 4 (*) >90 mL/min    GFR calc Af Amer 5 (*) >90 mL/min   CBC     Status: Abnormal   Collection Time   09/04/12  6:52 AM      Component Value Range Comment   WBC 6.9  4.0 - 10.5 K/uL    RBC 3.25 (*) 3.87 - 5.11 MIL/uL    Hemoglobin 10.1 (*) 12.0 - 15.0 g/dL    HCT 81.1 (*) 91.4 - 46.0 %    MCV 99.1  78.0 - 100.0 fL    MCH 31.1  26.0 - 34.0 pg    MCHC 31.4  30.0 - 36.0 g/dL    RDW 78.2  95.6 - 21.3 %    Platelets 176  150 - 400 K/uL    .  ROS: see above hpi   Physical Exam: Filed Vitals:   09/04/12 0736  BP: 102/52  Pulse:   Temp:   Resp:     Temp. =97.7   Pulse 84 resp =18   o2 sat  100 ra         General: alert  Adult BF,NAD , walking the halls with PT HEENT: abrasion on  Nose tip and forehead Eyes: eomi  Neck: no jvd Heart: RRR, no rub or murmur Lungs: CTA bilaterally Abdomen: obese, bs =+, soft nontender Extremities:  No edema Skin: abrasion on nose and forehead , no rash Neuro: walking halls, no acute deficits Dialysis Access: right upper arm hero Positive bruit  D[facility] . EDW 106.5 HD Bath 2k, ca 2.5  Time 4hr Heparin 6ml. Access right hero BFR 450 DFR 700    Zemplar 7 mcg IV/HD Epogen 2200   Units IV/HD  Venofer  0  Other 0  Assessment/Plan 1. Syncope with recent adjustments of Prednisone/ with Florinef added and Midodrine by Endocrinology= will increase Midodrine 10mg  tid continue Prednisone and Florinef/ wu futher  Per admit 2. ESRD -  Keep even on hd  tts schedule 3. Hypertension/volume  - Midodrine as above/ keep even on  hd 4. .Anemia  -  epo and no iron  , fu hgb, 10.1 this am  5. Metabolic bone disease -  Use 2.25 ca bath  On zemplar, binders also on Sensipar 6. Nutrition - high protein renal  7. History of Adrenal Insufficiency= as in 1  Lenny Pastel, PA-C [facility] [phone] 09/04/2012, 1:33 PM  I have seen and examined this patient and agree with plans and assessments as per [facility], PA.See highlighted sections.  Pt having recurrent episodes of presumed orthostatic syncope.  Prior dx of adrenal insufficiency - Dr. Everardo All is weaning prednisone, and has added florinef (along with midodrine) and is reevaluation HPA axis.  Will increase midodrine to 10 TID, keep even with next HD and raise weight, check orthostatics. Brette Cast B,MD 09/04/2012 3:05 PM

## 2012-09-04 NOTE — Evaluation (Signed)
Physical Therapy Evaluation Patient Details Name: Judy Green MRN: [id number] DOB: Dec 15, 1966 Today's Date: 09/04/2012 Time: 4098-1191 PT Time Calculation (min): 17 min  PT Assessment / Plan / Recommendation Clinical Impression  Pt adm with syncopal episode at home after dialysis.  Pt reports several of these episodes.  Mobility is good. No further PT needs. Fall due to medical issue (syncope) not balance or weakness issue.  Pt states awareness not to get up quickly from supine or sitting.    PT Assessment  Patent does not need any further PT services    Follow Up Recommendations  No PT follow up    Barriers to Discharge        Equipment Recommendations  None recommended by PT    Recommendations for Other Services     Frequency      Precautions / Restrictions Precautions Precautions: Fall Precaution Comments: fall due to history of syncopal episodes.   Pertinent Vitals/Pain BP 156/88 in sitting after amb.      Mobility  Bed Mobility Bed Mobility: Supine to Sit;Sitting - Scoot to Edge of Bed Supine to Sit: 7: Independent Sitting - Scoot to [place] of Bed: 7: Independent Transfers Transfers: Sit to Stand;Stand to Sit Sit to Stand: 7: Independent Stand to Sit: 7: Independent Ambulation/Gait Ambulation/Gait Assistance: 5: Supervision (only due to recent syncope) Ambulation Distance (Feet): 250 Feet Assistive device: None Gait Pattern: Wide base of support    Exercises     PT Diagnosis:    PT Problem List:   PT Treatment Interventions:     PT Goals    Visit Information  Last PT Received On: 09/04/12 Assistance Needed: +1    Subjective Data  Subjective: Pt states when she has syncopal episode she has no warning. Patient Stated Goal: Stop passing out.   Prior Functioning  Home Living Available Help at Discharge: Family Home Adaptive Equipment: None Prior Function Level of Independence: Independent Able to Take Stairs?: Yes Driving: Yes Vocation:  Full time employment Comments: Works for [employer] from home Communication Communication: No difficulties    Cognition  Overall Cognitive Status: Appears within functional limits for tasks assessed/performed Arousal/Alertness: Awake/alert Orientation Level: Appears intact for tasks assessed Behavior During Session: [facility] for tasks performed    Extremity/Trunk Assessment Right Lower Extremity Assessment RLE ROM/Strength/Tone: [facility] for tasks assessed Left Lower Extremity Assessment LLE ROM/Strength/Tone: [facility] for tasks assessed   Balance    End of Session PT - End of Session Equipment Utilized During Treatment: Gait belt Activity Tolerance: Patient tolerated treatment well Patient left: in chair;with family/visitor present (call bell with aunt) Nurse Communication: Mobility status  GP     Blessed Girdner 09/04/2012, 2:08 PM  [facility] PT [phone]

## 2012-09-04 NOTE — Progress Notes (Signed)
Pt's BP was 85/57, MD on call was made aware pt is asymptomatic. MD said to call if BP  drops lower and further orders will be given. Being that pt is asymptomatic MD wants pt's BP  to continue to be monitored periodically. Will continue to monitor.   Judy Green

## 2012-09-04 NOTE — Care Management Note (Unsigned)
    Page 1 of 1   09/04/2012     9:06:42 AM   CARE MANAGEMENT NOTE 09/04/2012  Patient:  Judy, Green   Account Number:  [id number]  Date Initiated:  09/04/2012  Documentation initiated by:  SIMMONS,Chiyeko Ferre  Subjective/Objective Assessment:   ADMITTED WITH SYNCOPE; LIVES [place] WITH HUSBAND- RICHARD AND 2 KIDS; WAS IPTA; HAS HD TTS.     Action/Plan:   DISCHARGE PLANNING DISCUSSED AT BEDSIDE.   Anticipated DC Date:  09/05/2012   Anticipated DC Plan:  HOME/SELF CARE      DC Planning Services  CM consult      Choice offered to / List presented to:             Status of service:  In process, will continue to follow Medicare Important Message given?   (If response is "NO", the following Medicare IM given date fields will be blank) Date Medicare IM given:   Date Additional Medicare IM given:    Discharge Disposition:    Per UR Regulation:  Reviewed for med. necessity/level of care/duration of stay  If discussed at Long Length of Stay Meetings, dates discussed:    Comments:  09/04/12  0851  Remell Giaimo SIMMONS RN, BSN [phone] NCM WILL FOLLOW.

## 2012-09-04 NOTE — Progress Notes (Signed)
Patient ID: Judy Green, female   DOB: 1966/12/06, 46 y.o.   MRN: [id number]  [facility] PROGRESS NOTE  Judy Green WUJ:[id number] DOB: 1966-05-01 DOA: 09/03/2012 PCP: Katy Apo, MD  HPI:  Pt is 46 yo female with ESRD on HD TTS, presents today for evaluation of syncopal episode she has sustained today after HD treatment. She reports similar episodes in the past after HD. She denies any chest pain or shortness of breath, no known prodromal events. She also denies any headaches or visual changes, no numbness or tingling, no specific focal neurologic weakness.   Assessment and Plan:  Syncopal event  - chronic adrenal insufficiency possible etiology ans pt is on chronic prednisone - no events on telemetry - per nephrology team increase the dose of Midodrine to 10 mg  - TSH is within normal limits  ESRD  - on HD  - per nephrology team  Anemia of chronic disease - Hg and Hct stable and at pt's baseline - CBC in AM  Code Status: Full  Family Communication: Pt at bedside  Disposition Plan: PT evaluation      HPI/Subjective: No events overnight.   Objective: Filed Vitals:   09/04/12 0557 09/04/12 0736 09/04/12 1338 09/04/12 1402  BP: 78/42 102/52 112/75 156/88  Pulse:   84   Temp:   97.7 F (36.5 C)   TempSrc:   Oral   Resp:   18   Height:      Weight:      SpO2:   100%     Intake/Output Summary (Last 24 hours) at 09/04/12 1715 Last data filed at 09/04/12 1230  Gross per 24 hour  Intake    720 ml  Output      0 ml  Net    720 ml    Exam:   General:  Pt is alert, follows commands appropriately, not in acute distress  Cardiovascular: Regular rate and rhythm, S1/S2, no murmurs, no rubs, no gallops  Respiratory: Clear to auscultation bilaterally, no wheezing, no crackles, no rhonchi  Abdomen: Soft, non tender, non distended, bowel sounds present, no guarding  Extremities: No edema, pulses DP and PT palpable bilaterally  Neuro:  Grossly nonfocal  Data Reviewed: Basic Metabolic Panel:  Lab 09/04/12 9562 09/03/12 1900 09/03/12 1706  NA 142 -- 137  K 4.2 -- 3.6  CL 98 -- 92*  CO2 33* -- 32  GLUCOSE 114* -- 119*  BUN 30* -- 18  CREATININE 9.21* -- 7.18*  CALCIUM 9.7 -- 10.9*  MG -- 2.4 --  PHOS -- 3.1 --   Liver Function Tests:  Lab 09/03/12 1706  AST 20  ALT 15  ALKPHOS 60  BILITOT 0.3  PROT 8.8*  ALBUMIN 4.5   CBC:  Lab 09/04/12 0652 09/03/12 1706  WBC 6.9 8.4  NEUTROABS -- 5.7  HGB 10.1* 12.1  HCT 32.2* 38.2  MCV 99.1 97.0  PLT 176 215   Cardiac Enzymes:  Lab 09/04/12 0652 09/04/12 0115 09/03/12 1910  CKTOTAL -- -- --  CKMB -- -- --  CKMBINDEX -- -- --  TROPONINI <0.30 <0.30 <0.30     Scheduled Meds:   . aspirin EC  325 mg Oral Daily  . calcium acetate  1,334 mg Oral TID WC  . cinacalcet  30 mg Oral BID  . clopidogrel  75 mg Oral Daily  . darbepoetin  40 mcg Subcutaneous Q Sat-HD  . enoxaparin (LOVENOX) injection  30 mg Subcutaneous Q24H  . fludrocortisone  0.1 mg Oral Daily  . gabapentin  100 mg Oral BID  . lanthanum  1,000 mg Oral TID WC  . midodrine  10 mg Oral TID  .  morphine injection  4 mg Intravenous Once  . norethindrone  1 tablet Oral Daily  . ondansetron (ZOFRAN) IV  4 mg Intravenous Once  . paricalcitol  7 mcg Intravenous Q T,Th,Sa-HD  . predniSONE  1.25 mg Oral Daily  . sodium chloride  250 mL Intravenous Once  . sodium chloride  500 mL Intravenous Once  . sodium chloride  3 mL Intravenous Q12H  . DISCONTD: sodium chloride   Intravenous STAT  . DISCONTD: Calcium Acetate  1,334 mg Oral TID WC  . DISCONTD: midodrine  5 mg Oral TID  . DISCONTD: norethindrone  1 tablet Oral Daily   Continuous Infusions:    Debbora Presto, MD  [facility] Pager [phone]  If 7PM-7AM, please contact night-coverage [email or web address] Password [email or web address] 09/04/2012, 5:15 PM   LOS: 1 day

## 2012-09-05 ENCOUNTER — Inpatient Hospital Stay (HOSPITAL_COMMUNITY): Payer: Medicare Other

## 2012-09-05 LAB — CBC
HCT: 33.5 % — ABNORMAL LOW (ref 36.0–46.0)
Hemoglobin: 10.7 g/dL — ABNORMAL LOW (ref 12.0–15.0)
MCH: 31.1 pg (ref 26.0–34.0)
MCH: 30.5 pg (ref 26.0–34.0)
MCHC: 31.4 g/dL (ref 30.0–36.0)
MCHC: 31.3 g/dL (ref 30.0–36.0)
MCV: 97.4 fL (ref 78.0–100.0)
RDW: 13.7 % (ref 11.5–15.5)

## 2012-09-05 LAB — RENAL FUNCTION PANEL
Albumin: 3.7 g/dL (ref 3.5–5.2)
BUN: 45 mg/dL — ABNORMAL HIGH (ref 6–23)
BUN: 47 mg/dL — ABNORMAL HIGH (ref 6–23)
Calcium: 9.9 mg/dL (ref 8.4–10.5)
Calcium: 9.6 mg/dL (ref 8.4–10.5)
Creatinine, Ser: 12.07 mg/dL — ABNORMAL HIGH (ref 0.50–1.10)
GFR calc Af Amer: 4 mL/min — ABNORMAL LOW (ref >90)
Glucose, Bld: 96 mg/dL (ref 70–99)
Phosphorus: 4.8 mg/dL — ABNORMAL HIGH (ref 2.3–4.6)
Phosphorus: 4.8 mg/dL — ABNORMAL HIGH (ref 2.3–4.6)
Sodium: 137 mEq/L (ref 135–145)

## 2012-09-05 MED ORDER — MIDODRINE HCL 5 MG PO TABS
ORAL_TABLET | ORAL | Status: AC
  Administered 2012-09-05: 10 mg via ORAL
  Filled 2012-09-05: qty 2

## 2012-09-05 MED ORDER — DARBEPOETIN ALFA-POLYSORBATE 40 MCG/0.4ML IJ SOLN
INTRAMUSCULAR | Status: AC
  Administered 2012-09-05: 40 ug via SUBCUTANEOUS
  Filled 2012-09-05: qty 0.4

## 2012-09-05 MED ORDER — PARICALCITOL 5 MCG/ML IV SOLN
INTRAVENOUS | Status: AC
  Administered 2012-09-05: 7 ug via INTRAVENOUS
  Filled 2012-09-05: qty 2

## 2012-09-05 NOTE — Progress Notes (Signed)
[facility] Rounding Note: Subjective: No dizziness or lightheadedness Has been walking, standing - asymptomatic Blood pressures all over the place and half of them are taken in the leg, half in the arm (would like ALL BP's to be taken in the left forearm rather than leg or left upper arm (left upper not accurate d/t multiple old grafts, legs notoriously inaccurate anyway) Orthostatics not done as ordered  Objective:    Vital signs in last 24 hours: Filed Vitals:   09/04/12 1402 09/04/12 1943 09/04/12 2223 09/05/12 0443  BP: 156/88 81/46 94/53  96/60  Pulse:  84  84  Temp:  97.9 F (36.6 C)  98.1 F (36.7 C)  TempSrc:  Oral  Oral  Resp:  18  18  Height:      Weight:      SpO2:  99%  95%   Weight change:   Intake/Output Summary (Last 24 hours) at 09/05/12 0713 Last data filed at 09/04/12 1900  Gross per 24 hour  Intake    960 ml  Output      0 ml  Net    960 ml    Physical Exam:  Blood pressure 96/60, pulse 84, temperature 98.1 F (36.7 C), temperature source Oral, resp. rate 18, height 5\' 6"  (1.676 m), weight 107.276 kg (236 lb 8 oz), SpO2 95.00%. Seen in dialysis Looks well BP 123/58 left forearm Weight 108 (usual EDW 106.5) Lungs entirely clear Abdomen soft not tender scars from prior xpl No edema of the lower extremities Right upper arm HERO access cannulated   Lab 09/05/12 0425 09/04/12 0652 09/03/12 1900 09/03/12 1706  NA 137 142 -- 137  K 4.9 4.2 -- 3.6  CL 93* 98 -- 92*  CO2 28 33* -- 32  GLUCOSE 96 114* -- 119*  BUN 45* 30* -- 18  CREATININE 11.93* 9.21* -- 7.18*  ALB -- -- -- --  CALCIUM 9.9 9.7 -- 10.9*  PHOS 4.8* -- 3.1 --     Lab 09/05/12 0425 09/03/12 1706  AST -- 20  ALT -- 15  ALKPHOS -- 60  BILITOT -- 0.3  PROT -- 8.8*  ALBUMIN 3.9 4.5     Lab 09/05/12 0425 09/04/12 0652 09/03/12 1706  WBC 8.0 6.9 8.4  NEUTROABS -- -- 5.7  HGB 10.7* 10.1* 12.1  HCT 34.1* 32.2* 38.2  MCV 99.1 99.1 97.0  PLT 175 176 215   Lab  09/04/12 0652 09/04/12 0115 09/03/12 1910  CKTOTAL -- -- --  CKMB -- -- --  CKMBINDEX -- -- --  TROPONINI <0.30 <0.30 <0.30    Studies/Results: Ct Head Wo Contrast  09/03/2012  *RADIOLOGY REPORT*  Clinical Data: Hypotension.  Syncope.  Dialysis today.  Fall with facial abrasions.  Headache and neck pain.  CT HEAD WITHOUT CONTRAST  Technique:  Contiguous axial images were obtained from the base of the skull through the vertex without contrast.  Comparison: 08/10/2011  Findings: The brain stem, cerebellum, cerebral peduncles, thalami, basal ganglia, basilar cisterns, and ventricular system appear unremarkable.  No intracranial hemorrhage, mass lesion, or acute infarction is identified.  IMPRESSION:  No significant abnormality identified.   Original Report Authenticated By: Dellia Cloud, M.D.       Medications . aspirin EC  325 mg Oral Daily  . calcium acetate  1,334 mg Oral TID WC  . cinacalcet  30 mg Oral BID  . clopidogrel  75 mg Oral Daily  . darbepoetin  40 mcg Subcutaneous Q Sat-HD  .  enoxaparin (LOVENOX) injection  30 mg Subcutaneous Q24H  . fludrocortisone  0.1 mg Oral Daily  . gabapentin  100 mg Oral BID  . lanthanum  1,000 mg Oral TID WC  . midodrine  10 mg Oral TID  . norethindrone  1 tablet Oral Daily  . paricalcitol  7 mcg Intravenous Q T,Th,Sa-HD  . predniSONE  1.25 mg Oral Daily  . sodium chloride  250 mL Intravenous Once  . sodium chloride  3 mL Intravenous Q12H  . DISCONTD: midodrine  5 mg Oral TID   I  have reviewed scheduled and prn medications.  Dialysis Orders: [facility] .  EDW 106.5 HD Bath 2k, ca 2.5 Time 4hr Heparin 6ml. Access right hero BFR 450 DFR 700  Zemplar 7 mcg IV/HD Epogen 2200 Units IV/HD Venofer 0   ASSESSMENT/RECOMMENDATIONS 1. Recurrent episodes of syncope and near syncope with recent adjustments of Prednisone (reduced by Dr. Everardo All with goal of taper)/ with Florinef added, and also on midodrine.  Have increased midodrine to 10  mg TID, asked for BP's to all be done in same extremity/location, and raising dry weight - for now to 108 kg (will keep even with HD today). On tele. No arrhythmias documented. Normal ECHO as outpt. 2. ESRD - Keep even on hd tts schedule 3. Hypertension/volume - Midodrine 10 TID as above/ keep even on hd 4. Anemia - epo and no iron , Hb in goal range 5. Metabolic bone disease -  2.25 ca bath On zemplar, binders, Sensipar 6. Nutrition - high protein renal  7. History of Adrenal Insufficiency - Dr. Everardo All tapering steroids with addition florinef so can ultimately retest adrenal function.   Camille Bal, MD [facility] [phone] Pager 09/05/2012, 7:13 AM

## 2012-09-05 NOTE — Progress Notes (Signed)
Patient ID: Judy Green, female   DOB: 1966-06-21, 46 y.o.   MRN: [id number]  [facility] PROGRESS NOTE  Judy Green WUJ:[id number] DOB: [birth year] DOA: 09/03/2012 PCP: Katy Apo, MD  HPI:  Pt is 46 yo female with ESRD on HD TTS, presents today for evaluation of syncopal episode she has sustained today after HD treatment. She reports similar episodes in the past after HD. She denies any chest pain or shortness of breath, no known prodromal events. She also denies any headaches or visual changes, no numbness or tingling, no specific focal neurologic weakness.   Assessment and Plan:  Syncopal event  - chronic adrenal insufficiency possible etiology ans pt is on chronic prednisone  - no events on telemetry  - per nephrology team increase the dose of Midodrine to 10 mg TID, also continuing Fludrocortisone and Prednisone (as was prescribed by Dr. Everardo All) - TSH is within normal limits  - will follow up on orthostatic vitals, discussed with nurse to call back with the results  ESRD  - on HD  - per nephrology team   Anemia of chronic disease  - Hg and Hct stable and at pt's baseline  - CBC in AM   Code Status: Full Family Communication: Pt at bedside Disposition Plan: Home when medically stable  HPI/Subjective: No events overnight.   Objective: Filed Vitals:   09/05/12 1000 09/05/12 1030 09/05/12 1100 09/05/12 1110  BP: 110/53 112/45 110/46 112/55  Pulse:   76 78  Temp:    97.1 F (36.2 C)  TempSrc:    Oral  Resp: 14 13 14 15   Height:      Weight:    108.1 kg (238 lb 5.1 oz)  SpO2:    98%    Intake/Output Summary (Last 24 hours) at 09/05/12 1201 Last data filed at 09/05/12 1110  Gross per 24 hour  Intake    720 ml  Output      6 ml  Net    714 ml    Exam:   General:  Pt is alert, follows commands appropriately, not in acute distress  Cardiovascular: Regular rate and rhythm, S1/S2, no murmurs, no rubs, no gallops  Respiratory: Clear to  auscultation bilaterally, no wheezing, no crackles, no rhonchi  Abdomen: Soft, non tender, non distended, bowel sounds present, no guarding  Extremities: No edema, pulses DP and PT palpable bilaterally  Neuro: Grossly nonfocal  Data Reviewed: Basic Metabolic Panel:  Lab 09/05/12 9562 09/05/12 0425 09/04/12 0652 09/03/12 1900 09/03/12 1706  NA 133* 137 142 -- 137  K 4.6 4.9 4.2 -- 3.6  CL 91* 93* 98 -- 92*  CO2 28 28 33* -- 32  GLUCOSE 96 96 114* -- 119*  BUN 47* 45* 30* -- 18  CREATININE 12.07* 11.93* 9.21* -- 7.18*  CALCIUM 9.6 9.9 9.7 -- 10.9*  MG -- -- -- 2.4 --  PHOS 4.8* 4.8* -- 3.1 --   Liver Function Tests:  Lab 09/05/12 0720 09/05/12 0425 09/03/12 1706  AST -- -- 20  ALT -- -- 15  ALKPHOS -- -- 60  BILITOT -- -- 0.3  PROT -- -- 8.8*  ALBUMIN 3.7 3.9 4.5   CBC:  Lab 09/05/12 0720 09/05/12 0425 09/04/12 0652 09/03/12 1706  WBC 6.8 8.0 6.9 8.4  NEUTROABS -- -- -- 5.7  HGB 10.5* 10.7* 10.1* 12.1  HCT 33.5* 34.1* 32.2* 38.2  MCV 97.4 99.1 99.1 97.0  PLT 172 175 176 215   Cardiac Enzymes:  Lab  09/04/12 1610 09/04/12 0115 09/03/12 1910  CKTOTAL -- -- --  CKMB -- -- --  CKMBINDEX -- -- --  TROPONINI <0.30 <0.30 <0.30   Scheduled Meds:   . aspirin EC  325 mg Oral Daily  . calcium acetate  1,334 mg Oral TID WC  . cinacalcet  30 mg Oral BID  . clopidogrel  75 mg Oral Daily  . darbepoetin  40 mcg Subcutaneous Q Sat-HD  . enoxaparin (LOVENOX) injection  30 mg Subcutaneous Q24H  . fludrocortisone  0.1 mg Oral Daily  . gabapentin  100 mg Oral BID  . lanthanum  1,000 mg Oral TID WC  . midodrine  10 mg Oral TID  . norethindrone  1 tablet Oral Daily  . paricalcitol  7 mcg Intravenous Q T,Th,Sa-HD  . predniSONE  1.25 mg Oral Daily  . sodium chloride  250 mL Intravenous Once  . sodium chloride  3 mL Intravenous Q12H  . DISCONTD: midodrine  5 mg Oral TID   Continuous Infusions:    Debbora Presto, MD  [facility] Pager [phone]  If  7PM-7AM, please contact night-coverage [email or web address] Password [email or web address] 09/05/2012, 12:01 PM   LOS: 2 days

## 2012-09-06 MED ORDER — MIDODRINE HCL 10 MG PO TABS: 10.0000 mg | ORAL_TABLET | Freq: Three times a day (TID) | ORAL | Status: AC

## 2012-09-06 MED ORDER — HYDROCODONE-ACETAMINOPHEN 5-325 MG PO TABS: 1.0000 | ORAL_TABLET | ORAL | Status: DC | PRN

## 2012-09-06 NOTE — Progress Notes (Signed)
D/c instructions provided along with medlist and scripts. IV d/c'd with catheter intact. Pt. Transported via wheelchair to main lobby per unit 2000 staff. Judy Green

## 2012-09-06 NOTE — Progress Notes (Signed)
Subjective: tolerated HD well (kept even with weight of 108 KG) No BP less than the 90's systolic Tolerating the higher dose of midodrine All BP's taken in left forearm Feels GREAT!  Objective:    Vital signs in last 24 hours: Filed Vitals:   09/05/12 1110 09/05/12 1500 09/05/12 2047 09/06/12 0527  BP: 112/55 95/76 97/55  95/67  Pulse: 78 90 88 72  Temp: 97.1 F (36.2 C) 97.5 F (36.4 C) 97.8 F (36.6 C) 98.4 F (36.9 C)  TempSrc: Oral Oral Oral Oral  Resp: 15 16 18 18   Height:      Weight: 108.1 kg (238 lb 5.1 oz)     SpO2: 98% 93% 97% 93%   Weight change: 1.2 kg (2 lb 10.3 oz)  Intake/Output Summary (Last 24 hours) at 09/06/12 0742 Last data filed at 09/06/12 0500  Gross per 24 hour  Intake    240 ml  Output      6 ml  Net    234 ml    Physical Exam:  Blood pressure 95/67, pulse 72, temperature 98.4 F (36.9 C), temperature source Oral, resp. rate 18, height 5\' 6"  (1.676 m), weight 108.1 kg (238 lb 5.1 oz), SpO2 93.00%. Lungs clear No edema Right HERO patent  Labs: none today    Medications: . aspirin EC  325 mg Oral Daily  . calcium acetate  1,334 mg Oral TID WC  . cinacalcet  30 mg Oral BID  . clopidogrel  75 mg Oral Daily  . darbepoetin  40 mcg Subcutaneous Q Sat-HD  . enoxaparin (LOVENOX) injection  30 mg Subcutaneous Q24H  . fludrocortisone  0.1 mg Oral Daily  . gabapentin  100 mg Oral BID  . lanthanum  1,000 mg Oral TID WC  . midodrine  10 mg Oral TID  . norethindrone  1 tablet Oral Daily  . paricalcitol  7 mcg Intravenous Q T,Th,Sa-HD  . predniSONE  1.25 mg Oral Daily  . sodium chloride  3 mL Intravenous Q12H     I  have reviewed scheduled and prn medications.  ASSESSMENT/RECOMMENDATIONS 1. Recurrent episodes of syncope and near syncope with recent adjustments of Prednisone (reduced by Dr. Everardo All with goal of taper)/ with Florinef added, and also on midodrine. BP stable past 24 hours.  Did well with HD with higher EDW to 108 kg and increase in  midodrine to 10 mg TID.   On tele. No arrhythmias documented. Normal ECHO as outpt. 2. ESRD - Kept even on hd tts schedule 3. Hypertension/volume - Midodrine 10 TID  4. Anemia - epo and no iron , Hb in goal range 5. Metabolic bone disease - 2.25 ca bath On zemplar, binders, Sensipar 6. Nutrition - high protein renal  7. History of Adrenal Insufficiency - Dr. Everardo All tapering steroids with addition florinef so can ultimately retest adrenal function.  I am fine with d/c to home, midodrine at 10 TID, new EDW of 108 kg, all BP's in left forearm, same prednisone and florinef   Camille Bal, MD [facility] [phone] Pager 09/06/2012, 7:42 AM

## 2012-09-06 NOTE — Discharge Summary (Signed)
Physician Discharge Summary  Judy Green ZOX:[id number] DOB: 08/22/1966 DOA: 09/03/2012  PCP: Katy Apo, MD  Admit date: 09/03/2012 Discharge date: 09/06/2012  Recommendations for Outpatient Follow-up:  1. Pt will need to follow up with PCP in 2-3 weeks post discharge 2. Pt will need to have CBC checked to ensure that Hg and Hct remain stable and at pt's baseline 3. In addition, please note that dose and frequency of Midodrine was increased to 10 mg TID in order to improve hypotension 4. Will ask Dr. Everardo All to perhaps retest adrenal function and in the meantime continue with tapering steroids as possible  Discharge Diagnoses:  Near syncope secondary to hypotension  Discharge Condition: Stable  Diet recommendation: Heart healthy diet discussed in details   HPI:  Pt is 46 yo female with ESRD on HD TTS, presents today for evaluation of syncopal episode she has sustained today after HD treatment. She reports similar episodes in the past after HD. She denies any chest pain or shortness of breath, no known prodromal events. She also denies any headaches or visual changes, no numbness or tingling, no specific focal neurologic weakness.   Assessment and Plan:   Syncopal event  - chronic adrenal insufficiency possible etiology, pt is on chronic prednisone  - no events on telemetry during this hospital stay - per nephrology team the dose of Midodrine was increased to 10 mg TID, also continued Fludrocortisone and Prednisone (as was prescribed by Dr. Everardo All)  - TSH is within normal limits  - orthostatic vitals checked and were within normal limits - pt will need to have adrenal function re tested   ESRD  - on HD  - per nephrology team  - pt tolerated well   Anemia of chronic disease  - Hg and Hct stable and at pt's baseline  - CBC will have to be checked upon follow up with PCP  Code Status: Full  Family Communication: Pt at bedside  Disposition Plan: Home when  medically stable  Discharge Exam: Filed Vitals:   09/06/12 0527  BP: 95/67  Pulse: 72  Temp: 98.4 F (36.9 C)  Resp: 18   Filed Vitals:   09/05/12 1110 09/05/12 1500 09/05/12 2047 09/06/12 0527  BP: 112/55 95/76 97/55  95/67  Pulse: 78 90 88 72  Temp: 97.1 F (36.2 C) 97.5 F (36.4 C) 97.8 F (36.6 C) 98.4 F (36.9 C)  TempSrc: Oral Oral Oral Oral  Resp: 15 16 18 18   Height:      Weight: 108.1 kg (238 lb 5.1 oz)     SpO2: 98% 93% 97% 93%    General: Pt is alert, follows commands appropriately, not in acute distress Cardiovascular: Regular rate and rhythm, S1/S2 +, no murmurs, no rubs, no gallops Respiratory: Clear to auscultation bilaterally, no wheezing, no crackles, no rhonchi Abdominal: Soft, non tender, non distended, bowel sounds +, no guarding Extremities: no edema, no cyanosis, pulses palpable bilaterally DP and PT Neuro: Grossly nonfocal  Discharge Instructions  Discharge Orders    Future Appointments: Provider: Department: Dept Phone: Center:   09/07/2012 8:30 AM Rosalio Macadamia, NP [facility] [phone] None   09/07/2012 10:45 AM Romero Belling, MD [facility] [phone] [email or web address]   09/14/2012 9:15 AM Rachael Fee, MD [facility] [phone] [facility]     Future Orders Please Complete By Expires   Diet - low sodium heart healthy      Increase activity slowly      Discharge instructions  Comments:   Please call me at my cell phone [phone] with any questions or concerns. Dr. Danie Binder       Medication List     As of 09/06/2012  7:46 AM    TAKE these medications         aspirin EC 325 MG tablet   Take 325 mg by mouth daily.      Calcium Acetate 667 MG Tabs   Take 1,334 mg by mouth 3 (three) times daily with meals.      clopidogrel 75 MG tablet   Commonly known as: PLAVIX   Take 75 mg by mouth daily.      fludrocortisone 0.1 MG tablet   Commonly known as: FLORINEF   Take 1 tablet (0.1 mg total) by mouth daily.        FOSRENOL 1000 MG chewable tablet   Generic drug: lanthanum   Chew 1,000 mg by mouth 3 (three) times daily with meals.      gabapentin 100 MG capsule   Commonly known as: NEURONTIN   Take 100 mg by mouth 2 (two) times daily.      HYDROcodone-acetaminophen 5-325 MG per tablet   Commonly known as: NORCO/VICODIN   Take 1-2 tablets by mouth every 4 (four) hours as needed.      JOLIVETTE 0.35 MG tablet   Generic drug: norethindrone   Take 1 tablet by mouth daily.      midodrine 10 MG tablet   Commonly known as: PROAMATINE   Take 1 tablet (10 mg total) by mouth 3 (three) times daily.      ONE-A-DAY WOMENS PO   Take 1 tablet by mouth daily.      predniSONE 2.5 MG tablet   Commonly known as: DELTASONE   Take 1.25 mg by mouth daily.      SENSIPAR 30 MG tablet   Generic drug: cinacalcet   Take 30 mg by mouth 2 (two) times daily.           Follow-up Information    Follow up with Romero Belling, MD. In 2 weeks.   Contact information:   [address] [facility] [address]th [address] [place] [address] [phone]           The results of significant diagnostics from this hospitalization (including imaging, microbiology, ancillary and laboratory) are listed below for reference.     Microbiology: No results found for this or any previous visit (from the past 240 hour(s)).   Labs: Basic Metabolic Panel:  Lab 09/05/12 2440 09/05/12 0425 09/04/12 0652 09/03/12 1900 09/03/12 1706  NA 133* 137 142 -- 137  K 4.6 4.9 4.2 -- 3.6  CL 91* 93* 98 -- 92*  CO2 28 28 33* -- 32  GLUCOSE 96 96 114* -- 119*  BUN 47* 45* 30* -- 18  CREATININE 12.07* 11.93* 9.21* -- 7.18*  CALCIUM 9.6 9.9 9.7 -- 10.9*  MG -- -- -- 2.4 --  PHOS 4.8* 4.8* -- 3.1 --   Liver Function Tests:  Lab 09/05/12 0720 09/05/12 0425 09/03/12 1706  AST -- -- 20  ALT -- -- 15  ALKPHOS -- -- 60  BILITOT -- -- 0.3  PROT -- -- 8.8*  ALBUMIN 3.7 3.9 4.5   No results found for this basename: LIPASE:5,AMYLASE:5 in the last  168 hours No results found for this basename: AMMONIA:5 in the last 168 hours CBC:  Lab 09/05/12 0720 09/05/12 0425 09/04/12 0652 09/03/12 1706  WBC 6.8 8.0 6.9 8.4  NEUTROABS -- -- -- 5.7  HGB 10.5* 10.7* 10.1* 12.1  HCT 33.5* 34.1* 32.2* 38.2  MCV 97.4 99.1 99.1 97.0  PLT 172 175 176 215   Cardiac Enzymes:  Lab 09/04/12 0652 09/04/12 0115 09/03/12 1910  CKTOTAL -- -- --  CKMB -- -- --  CKMBINDEX -- -- --  TROPONINI <0.30 <0.30 <0.30   BNP: BNP (last 3 results) No results found for this basename: PROBNP:3 in the last 8760 hours CBG: No results found for this basename: GLUCAP:5 in the last 168 hours   SIGNED: Time coordinating discharge: Over 30 minutes  Debbora Presto, MD  [facility] 09/06/2012, 7:46 AM Pager [phone]  If 7PM-7AM, please contact night-coverage [email or web address] Password [email or web address]

## 2012-09-07 ENCOUNTER — Encounter: Payer: Self-pay | Admitting: Endocrinology

## 2012-09-07 ENCOUNTER — Ambulatory Visit (INDEPENDENT_AMBULATORY_CARE_PROVIDER_SITE_OTHER): Payer: Medicare Other | Admitting: Endocrinology

## 2012-09-07 ENCOUNTER — Encounter: Payer: Self-pay | Admitting: Nurse Practitioner

## 2012-09-07 ENCOUNTER — Ambulatory Visit (INDEPENDENT_AMBULATORY_CARE_PROVIDER_SITE_OTHER): Payer: Medicare Other | Admitting: Nurse Practitioner

## 2012-09-07 VITALS — BP 92/58 | HR 78 | Temp 97.5°F | Resp 16 | Ht 66.5 in | Wt 246.2 lb

## 2012-09-07 VITALS — BP 90/58 | HR 78 | Ht 66.5 in | Wt 245.8 lb

## 2012-09-07 DIAGNOSIS — I959 Hypotension, unspecified: Secondary | ICD-10-CM

## 2012-09-07 DIAGNOSIS — I951 Orthostatic hypotension: Secondary | ICD-10-CM

## 2012-09-07 MED ORDER — FLUDROCORTISONE ACETATE 0.1 MG PO TABS: ORAL_TABLET | ORAL | Status: DC

## 2012-09-07 NOTE — Progress Notes (Signed)
Subjective:    Patient ID: Judy Green, female    DOB: 1966-08-28, 46 y.o.   MRN: [id number]  HPI Pt went on hemodialysis in 1995.  She received a renal transplant in 2000.  The transplanted kidney failed in 2007, due to an infection.   She has been back on hemodialysis since 2007.  She was on bp meds until 2010.  They were stopped then due to normalization of her BP.  She then developed hypotension, which has been persistent since then.  She has few years of constant moderate dizziness sensation in the head.  She has had assoc LOC twice--most recently approx 1 month ago.  Pt states she was first started on prednisone for her transplant, but she has been kept on it due to her hypotension.  Since last oc, she had syncopal episode after dialysis, and bp was noted to be low.  Midodrine was increased, and she is still on florinef.   Past Medical History  Diagnosis Date  . Chronic kidney disease     KIDNEY FAILURE  . ESRD (end stage renal disease)     Complications of transplanted kidney  . Rectal bleeding 2007    hemorrhoids/diverticular disease  . Retroperitoneal hematoma 9/07    transfusion  . H/O parathyroidectomy 1999  . UTI (lower urinary tract infection)   . Adrenal insufficiency 2009  . Sleep apnea     does not have cpap at this time  . Oral contraceptive use     patient states has not had menstral cycle in 1 year.  . Hypertension     Dr. Renford Dills [phone]  . Chronic headaches   . H/O syncope   . Breast cancer     RT  . Anemia     hx of  . Renal insufficiency     Past Surgical History  Procedure Date  . Av fistula placement 05/08/2007    Right AVG  . Hero avg 12/14/2008    Left   . Hero 04/17/2009    Removed clotted Left Hero  2) New Right Hero placed and Stent to SVC  . Lumpectomy     left breast  . Exploration renal s/p transplantation   . Tubal ligation   . Avgg removal 08/07/2012    Procedure: REMOVAL OF ARTERIOVENOUS GORETEX GRAFT (AVGG);   Surgeon: Larina Earthly, MD;  Location: [facility] OR;  Service: Vascular;  Laterality: Right;    History   Social History  . Marital Status: Married    Spouse Name: N/A    Number of Children: 2  . Years of Education: N/A   Occupational History  . customer service [employer]   Social History Main Topics  . Smoking status: Former Smoker    Quit date: 12/16/2002  . Smokeless tobacco: Never Used  . Alcohol Use: Yes     2-3x/week  . Drug Use: No  . Sexually Active: Not Currently   Other Topics Concern  . Not on file   Social History Narrative   Regular exercise-yesCaffeine Use-yes    Current Outpatient Prescriptions on File Prior to Visit  Medication Sig Dispense Refill  . aspirin EC 325 MG tablet Take 325 mg by mouth daily.      . Calcium Acetate 667 MG TABS Take 1,334 mg by mouth 3 (three) times daily with meals.       . clopidogrel (PLAVIX) 75 MG tablet Take 75 mg by mouth daily.        Marland Kitchen  FOSRENOL 1000 MG chewable tablet Chew 1,000 mg by mouth 3 (three) times daily with meals.       . gabapentin (NEURONTIN) 100 MG capsule Take 100 mg by mouth 2 (two) times daily.       Marland Kitchen HYDROcodone-acetaminophen (NORCO/VICODIN) 5-325 MG per tablet Take 1-2 tablets by mouth every 4 (four) hours as needed.  45 tablet  0  . JOLIVETTE 0.35 MG tablet Take 1 tablet by mouth daily.       . midodrine (PROAMATINE) 10 MG tablet Take 1 tablet (10 mg total) by mouth 3 (three) times daily.  30 tablet  1  . Multiple Vitamins-Calcium (ONE-A-DAY WOMENS PO) Take 1 tablet by mouth daily.      . predniSONE (DELTASONE) 2.5 MG tablet Take 1.25 mg by mouth daily.       . SENSIPAR 30 MG tablet Take 30 mg by mouth 2 (two) times daily.         No Known Allergies  Family History  Problem Relation Age of Onset  . Diabetes Mother   . Diabetes Father   . Hypertension Father   . Heart disease Maternal Grandmother   . Diabetes Maternal Grandmother   . Stroke Maternal Grandmother   . Hypertension Brother   .  Hypertension Brother   . Hypertension Brother   . Cancer Other     Prostate Cancer-Uncle, Colon Cancer-Uncle    BP 92/58  Pulse 78  Temp 97.5 F (36.4 C)  Resp 16  Ht 5' 6.5" (1.689 m)  Wt 246 lb 3.2 oz (111.676 kg)  BMI 39.14 kg/m2  SpO2 95%   Review of Systems She has a few lbs of weight gain    Objective:   Physical Exam VITAL SIGNS:  See vs page GENERAL: no distress Gait is normal and steady     Assessment & Plan:  Hypotension, needs increased rx

## 2012-09-07 NOTE — Patient Instructions (Addendum)
Please stop the prednisone after tomorrows pill.  Please come back for a follow-up appointment in 2 days.  Increase fludrocortisone to 2 pills daily.

## 2012-09-07 NOTE — Patient Instructions (Signed)
Stay on your current medicines  See Dr. Jordan in a month  Call the [facility] office at [phone] if you have any questions, problems or concerns.   

## 2012-09-07 NOTE — Progress Notes (Signed)
Judy Green Date of Birth: 1966/08/22 Medical Record #[id number]  History of Present Illness: [name] is seen back today for a 3 week check. She is seen for Dr. [place]. She has ESRD and is on chronic hemodialysis. Has had issues with lightheadedness and orthostasis. Her most recent echo showed mild LVH with normal systolic function. Hormonal work up to rule out adrenal insufficiency with Dr. Nehemiah Settle was also advised. She has had prior failed renal transplant and had been on chronic steroid therapy and became adrenally insufficient at that time. Midodrine was added after her echo. She was improving when I saw her earlier this month.   She comes in today. She is here alone. She has been recently admitted. She fell this past Thursday after her dialysis. She hit her face and head. She went to the ER. Was admitted. Just discharged yesterday. Renal was consulted. Her dry weight has been increased to 108 kg. Midodrine has been increased. She had seen Dr. Everardo All and had also been started on Florinef. She is seeing him later this morning for medication adjustment. No chest pain. She says she feels better now since her dry weight has been adjusted.   Current Outpatient Prescriptions on File Prior to Visit  Medication Sig Dispense Refill  . aspirin EC 325 MG tablet Take 325 mg by mouth daily.      . Calcium Acetate 667 MG TABS Take 1,334 mg by mouth 3 (three) times daily with meals.       . clopidogrel (PLAVIX) 75 MG tablet Take 75 mg by mouth daily.        . fludrocortisone (FLORINEF) 0.1 MG tablet Take 1 tablet (0.1 mg total) by mouth daily.  30 tablet  2  . FOSRENOL 1000 MG chewable tablet Chew 1,000 mg by mouth 3 (three) times daily with meals.       . gabapentin (NEURONTIN) 100 MG capsule Take 100 mg by mouth 2 (two) times daily.       Marland Kitchen HYDROcodone-acetaminophen (NORCO/VICODIN) 5-325 MG per tablet Take 1-2 tablets by mouth every 4 (four) hours as needed.  45 tablet  0  . JOLIVETTE 0.35  MG tablet Take 1 tablet by mouth daily.       . midodrine (PROAMATINE) 10 MG tablet Take 1 tablet (10 mg total) by mouth 3 (three) times daily.  30 tablet  1  . Multiple Vitamins-Calcium (ONE-A-DAY WOMENS PO) Take 1 tablet by mouth daily.      . predniSONE (DELTASONE) 2.5 MG tablet Take 1.25 mg by mouth daily.       . SENSIPAR 30 MG tablet Take 30 mg by mouth 2 (two) times daily.        Current Facility-Administered Medications on File Prior to Visit  Medication Dose Route Frequency Provider Last Rate Last Dose  . DISCONTD: aspirin EC tablet 325 mg  325 mg Oral Daily Dorothea Ogle, MD   325 mg at 09/05/12 1202  . DISCONTD: calcium acetate (PHOSLO) capsule 1,334 mg  1,334 mg Oral TID WC Dorothea Ogle, MD   1,334 mg at 09/06/12 1478  . DISCONTD: cinacalcet (SENSIPAR) tablet 30 mg  30 mg Oral BID Dorothea Ogle, MD   30 mg at 09/05/12 2120  . DISCONTD: clopidogrel (PLAVIX) tablet 75 mg  75 mg Oral Daily Dorothea Ogle, MD   75 mg at 09/05/12 1201  . DISCONTD: darbepoetin (ARANESP) injection 40 mcg  40 mcg Subcutaneous Q Sat-HD Sadie Haber, MD  40 mcg at 09/05/12 0806  . DISCONTD: enoxaparin (LOVENOX) injection 30 mg  30 mg Subcutaneous Q24H Dorothea Ogle, MD   30 mg at 09/05/12 2119  . DISCONTD: fludrocortisone (FLORINEF) tablet 0.1 mg  0.1 mg Oral Daily Dorothea Ogle, MD   0.1 mg at 09/05/12 1202  . DISCONTD: gabapentin (NEURONTIN) capsule 100 mg  100 mg Oral BID Dorothea Ogle, MD   100 mg at 09/05/12 2120  . DISCONTD: HYDROcodone-acetaminophen (NORCO/VICODIN) 5-325 MG per tablet 1-2 tablet  1-2 tablet Oral Q4H PRN Dorothea Ogle, MD   2 tablet at 09/05/12 2127  . DISCONTD: lanthanum (FOSRENOL) chewable tablet 1,000 mg  1,000 mg Oral TID WC Dorothea Ogle, MD   1,000 mg at 09/06/12 0754  . DISCONTD: midodrine (PROAMATINE) tablet 10 mg  10 mg Oral TID Sadie Haber, MD   10 mg at 09/05/12 2120  . DISCONTD: norethindrone (MICRONOR,CAMILA,ERRIN) 0.35 MG tablet 0.35 mg  1 tablet Oral Daily Dorothea Ogle, MD   0.35 mg at 09/04/12 1000  . DISCONTD: ondansetron (ZOFRAN) injection 4 mg  4 mg Intravenous Q6H PRN Dorothea Ogle, MD   4 mg at 09/04/12 0935  . DISCONTD: ondansetron (ZOFRAN) tablet 4 mg  4 mg Oral Q6H PRN Dorothea Ogle, MD      . DISCONTD: paricalcitol [facility]) injection 7 mcg  7 mcg Intravenous Q T,Th,Sa-HD Sadie Haber, MD   7 mcg at 09/05/12 [phone]  . DISCONTD: predniSONE (DELTASONE) tablet 1.25 mg  1.25 mg Oral Daily Dorothea Ogle, MD   2.5 mg at 09/05/12 1202  . DISCONTD: sodium chloride 0.9 % injection 3 mL  3 mL Intravenous Q12H Dorothea Ogle, MD   3 mL at 09/05/12 2120      No Known Allergies  Past Medical History  Diagnosis Date  . Chronic kidney disease     KIDNEY FAILURE  . ESRD (end stage renal disease)     Complications of transplanted kidney  . Rectal bleeding 2007    hemorrhoids/diverticular disease  . Retroperitoneal hematoma 9/07    transfusion  . H/O parathyroidectomy 1999  . UTI (lower urinary tract infection)   . Adrenal insufficiency 2009  . Sleep apnea     does not have cpap at this time  . Oral contraceptive use     patient states has not had menstral cycle in 1 year.  . Hypertension     Dr. Renford Dills [phone]  . Chronic headaches   . H/O syncope   . Breast cancer     RT  . Anemia     hx of  . Renal insufficiency     Past Surgical History  Procedure Date  . Av fistula placement 05/08/2007    Right AVG  . Hero avg 12/14/2008    Left   . Hero 04/17/2009    Removed clotted Left Hero  2) New Right Hero placed and Stent to SVC  . Lumpectomy     left breast  . Exploration renal s/p transplantation   . Tubal ligation   . Avgg removal 08/07/2012    Procedure: REMOVAL OF ARTERIOVENOUS GORETEX GRAFT (AVGG);  Surgeon: Larina Earthly, MD;  Location: [facility] OR;  Service: Vascular;  Laterality: Right;    History  Smoking status  . Former Smoker  . Quit date: 12/16/2002  Smokeless tobacco  . Never Used    History  Alcohol Use  .  Yes  2-3x/week    Family History  Problem Relation Age of Onset  . Diabetes Mother   . Diabetes Father   . Hypertension Father   . Heart disease Maternal Grandmother   . Diabetes Maternal Grandmother   . Stroke Maternal Grandmother   . Hypertension Brother   . Hypertension Brother   . Hypertension Brother   . Cancer Other     Prostate Cancer-Uncle, Colon Cancer-Uncle    Review of Systems: The review of systems is per the HPI.  All other systems were reviewed and are negative.  Physical Exam: BP 90/58  Pulse 78  Ht 5' 6.5" (1.689 m)  Wt 245 lb 12.8 oz (111.494 kg)  BMI 39.08 kg/m2 Patient is very pleasant and in no acute distress. She is obese Skin is warm and dry. Color is normal.  HEENT is unremarkable except for facial abrasions. Normocephalic/atraumatic. PERRL. Sclera are nonicteric. Neck is supple. No masses. No JVD. Lungs are clear. Cardiac exam shows a regular rate and rhythm. Abdomen is soft. Extremities are without edema. Gait and ROM are intact. No gross neurologic deficits noted.   LABORATORY DATA:  Lab Results  Component Value Date   WBC 6.8 09/05/2012   HGB 10.5* 09/05/2012   HCT 33.5* 09/05/2012   PLT 172 09/05/2012   GLUCOSE 96 09/05/2012   CHOL 236* 09/03/2012   TRIG 257* 09/03/2012   HDL 60 09/03/2012   LDLCALC 125* 09/03/2012   ALT 15 09/03/2012   AST 20 09/03/2012   NA 133* 09/05/2012   K 4.6 09/05/2012   CL 91* 09/05/2012   CREATININE 12.07* 09/05/2012   BUN 47* 09/05/2012   CO2 28 09/05/2012   TSH 1.513 09/03/2012   INR 1.11 08/07/2012   HGBA1C 5.3 09/03/2012    Assessment / Plan:  1. Orthostatic hypotension, symptomatic with recent fall. Now on Midodrine and Florinef. Her dry weight has been adjusted. This plan seems to be helping at this time.   2. ESRD - on chronic hemodialysis. Dry weight has been adjusted.   3. Adrenal insufficiency - now seeing Dr. Everardo All.   I have left her on her current regimen. Dr. Sw[place] and I will see her back in a month.  Patient is agreeable to this plan and will call if any problems develop in the interim.

## 2012-09-09 ENCOUNTER — Ambulatory Visit: Payer: Medicare Other

## 2012-09-09 ENCOUNTER — Ambulatory Visit (INDEPENDENT_AMBULATORY_CARE_PROVIDER_SITE_OTHER): Payer: Medicare Other | Admitting: Endocrinology

## 2012-09-09 ENCOUNTER — Encounter: Payer: Self-pay | Admitting: Endocrinology

## 2012-09-09 ENCOUNTER — Other Ambulatory Visit (INDEPENDENT_AMBULATORY_CARE_PROVIDER_SITE_OTHER): Payer: Medicare Other

## 2012-09-09 VITALS — BP 104/68 | HR 84 | Temp 98.3°F | Wt 242.0 lb

## 2012-09-09 DIAGNOSIS — I959 Hypotension, unspecified: Secondary | ICD-10-CM

## 2012-09-09 MED ORDER — COSYNTROPIN 0.25 MG IJ SOLR
0.2500 mg | Freq: Once | INTRAMUSCULAR | Status: AC
  Administered 2012-09-09: 0.25 mg via INTRAMUSCULAR

## 2012-09-09 NOTE — Progress Notes (Signed)
Subjective:    Patient ID: Judy Green, female    DOB: Apr 06, 1966, 46 y.o.   MRN: [id number]  HPI Pt went on hemodialysis in 1995.  She received a renal transplant in 2000.  The transplanted kidney failed in 2007, due to an infection.   She has been back on hemodialysis since 2007.  She was on bp meds until 2010.  They were stopped then due to normalization of her BP.  She then developed hypotension, which has been persistent since then.  She has few years of constant moderate dizziness sensation in the head.  She has had assoc LOC twice--most recently approx 1 month ago.  Pt states she was first started on prednisone for her transplant, but she has been kept on it due to her hypotension. She feels better on the increased dosage of florinef.   Past Medical History  Diagnosis Date  . Chronic kidney disease     KIDNEY FAILURE  . ESRD (end stage renal disease)     Complications of transplanted kidney  . Rectal bleeding 2007    hemorrhoids/diverticular disease  . Retroperitoneal hematoma 9/07    transfusion  . H/O parathyroidectomy 1999  . UTI (lower urinary tract infection)   . Adrenal insufficiency 2009  . Sleep apnea     does not have cpap at this time  . Oral contraceptive use     patient states has not had menstral cycle in 1 year.  . Hypertension     Dr. Renford Dills [phone]  . Chronic headaches   . H/O syncope   . Breast cancer     RT  . Anemia     hx of  . Renal insufficiency     Past Surgical History  Procedure Date  . Av fistula placement 05/08/2007    Right AVG  . Hero avg 12/14/2008    Left   . Hero 04/17/2009    Removed clotted Left Hero  2) New Right Hero placed and Stent to SVC  . Lumpectomy     left breast  . Exploration renal s/p transplantation   . Tubal ligation   . Avgg removal 08/07/2012    Procedure: REMOVAL OF ARTERIOVENOUS GORETEX GRAFT (AVGG);  Surgeon: Larina Earthly, MD;  Location: [facility] OR;  Service: Vascular;  Laterality: Right;     History   Social History  . Marital Status: Married    Spouse Name: N/A    Number of Children: 2  . Years of Education: N/A   Occupational History  . customer service [employer]   Social History Main Topics  . Smoking status: Former Smoker    Quit date: 12/16/2002  . Smokeless tobacco: Never Used  . Alcohol Use: Yes     2-3x/week  . Drug Use: No  . Sexually Active: Not Currently   Other Topics Concern  . Not on file   Social History Narrative   Regular exercise-yesCaffeine Use-yes    Current Outpatient Prescriptions on File Prior to Visit  Medication Sig Dispense Refill  . aspirin EC 325 MG tablet Take 325 mg by mouth daily.      . Calcium Acetate 667 MG TABS Take 1,334 mg by mouth 3 (three) times daily with meals.       . clopidogrel (PLAVIX) 75 MG tablet Take 75 mg by mouth daily.        . fludrocortisone (FLORINEF) 0.1 MG tablet 2 pills daily  60 tablet  11  . FOSRENOL 1000 MG  chewable tablet Chew 1,000 mg by mouth 3 (three) times daily with meals.       . gabapentin (NEURONTIN) 100 MG capsule Take 100 mg by mouth 2 (two) times daily.       Marland Kitchen HYDROcodone-acetaminophen (NORCO/VICODIN) 5-325 MG per tablet Take 1-2 tablets by mouth every 4 (four) hours as needed.  45 tablet  0  . JOLIVETTE 0.35 MG tablet Take 1 tablet by mouth daily.       . midodrine (PROAMATINE) 10 MG tablet Take 1 tablet (10 mg total) by mouth 3 (three) times daily.  30 tablet  1  . Multiple Vitamins-Calcium (ONE-A-DAY WOMENS PO) Take 1 tablet by mouth daily.      . predniSONE (DELTASONE) 2.5 MG tablet Take 1.25 mg by mouth daily.       . SENSIPAR 30 MG tablet Take 30 mg by mouth 2 (two) times daily.         No Known Allergies  Family History  Problem Relation Age of Onset  . Diabetes Mother   . Diabetes Father   . Hypertension Father   . Heart disease Maternal Grandmother   . Diabetes Maternal Grandmother   . Stroke Maternal Grandmother   . Hypertension Brother   . Hypertension  Brother   . Hypertension Brother   . Cancer Other     Prostate Cancer-Uncle, Colon Cancer-Uncle    BP 104/68  Pulse 84  Temp 98.3 F (36.8 C) (Oral)  Wt 242 lb (109.77 kg)  SpO2 94%  Review of Systems Denies LOC    Objective:   Physical Exam VITAL SIGNS:  See vs page GENERAL: no distress Ext: no edema Gait: normal and steady   acth stimulation test is done: baseline cortisol level=10 then cosyntropin 250 mcg is given im 45 minutes later, cortisol level=19 (normal response)     Assessment & Plan:  Hypotension, better.  Adrenal insuff has been excluded as a cause

## 2012-09-09 NOTE — Patient Instructions (Addendum)
blood tests are being requested for you today.  You will be contacted with results. Please come back for a follow-up appointment for 1 month.  

## 2012-09-14 ENCOUNTER — Ambulatory Visit: Payer: Medicare Other | Admitting: Gastroenterology

## 2012-09-17 ENCOUNTER — Telehealth: Payer: Self-pay | Admitting: Gastroenterology

## 2012-09-17 NOTE — Telephone Encounter (Signed)
Message copied by Arna Snipe on Thu Sep 17, 2012  9:10 AM ------      Message from: Donata Duff      Created: Mon Sep 14, 2012  9:36 AM       Do not bill

## 2012-10-05 ENCOUNTER — Ambulatory Visit: Payer: Medicare Other | Admitting: Nurse Practitioner

## 2012-10-09 ENCOUNTER — Ambulatory Visit: Payer: Medicare Other | Admitting: Endocrinology

## 2012-10-09 ENCOUNTER — Ambulatory Visit: Payer: Medicare Other | Admitting: Gastroenterology

## 2012-10-16 ENCOUNTER — Encounter: Payer: Self-pay | Admitting: Endocrinology

## 2012-10-16 ENCOUNTER — Ambulatory Visit (INDEPENDENT_AMBULATORY_CARE_PROVIDER_SITE_OTHER): Payer: Medicare Other | Admitting: Endocrinology

## 2012-10-16 VITALS — BP 128/80 | HR 80 | Temp 98.4°F | Wt 240.0 lb

## 2012-10-16 DIAGNOSIS — I959 Hypotension, unspecified: Secondary | ICD-10-CM

## 2012-10-16 NOTE — Patient Instructions (Addendum)
Please continue the same medications.   i would be happy to see you back here whenever you want.

## 2012-10-16 NOTE — Progress Notes (Signed)
Subjective:    Patient ID: Judy Green, female    DOB: 01-15-1966, 46 y.o.   MRN: [id number]  HPI Pt went on hemodialysis in 1995.  She received a renal transplant in 2000.  The transplanted kidney failed in 2007, due to an infection.   She has been back on hemodialysis since 2007.  She plans to go back on the transplant list next June.  She was on bp meds until 2010.  They were stopped then due to normalization of her BP.  She then developed hypotension, which has been persistent since then.  She has had assoc LOC twice--most recently in mid-2013. Pt states she was first started on prednisone for her transplant, but she has been kept on it due to her hypotension. She feels better on the increased dosage of florinef. ACTH test was normal, so the prednisone was stopped.  Dizziness recurred off the midodrine, so it was restarted.  She has lost a few lbs, due to her efforts.  Past Medical History  Diagnosis Date  . Chronic kidney disease     KIDNEY FAILURE  . ESRD (end stage renal disease)     Complications of transplanted kidney  . Rectal bleeding 2007    hemorrhoids/diverticular disease  . Retroperitoneal hematoma 9/07    transfusion  . H/O parathyroidectomy 1999  . UTI (lower urinary tract infection)   . Adrenal insufficiency 2009  . Sleep apnea     does not have cpap at this time  . Oral contraceptive use     patient states has not had menstral cycle in 1 year.  . Hypertension     Dr. Renford Dills [phone]  . Chronic headaches   . H/O syncope   . Breast cancer     RT  . Anemia     hx of  . Renal insufficiency     Past Surgical History  Procedure Date  . Av fistula placement 05/08/2007    Right AVG  . Hero avg 12/14/2008    Left   . Hero 04/17/2009    Removed clotted Left Hero  2) New Right Hero placed and Stent to SVC  . Lumpectomy     left breast  . Exploration renal s/p transplantation   . Tubal ligation   . Avgg removal 08/07/2012    Procedure: REMOVAL OF  ARTERIOVENOUS GORETEX GRAFT (AVGG);  Surgeon: Larina Earthly, MD;  Location: [facility] OR;  Service: Vascular;  Laterality: Right;    History   Social History  . Marital Status: Married    Spouse Name: N/A    Number of Children: 2  . Years of Education: N/A   Occupational History  . customer service [employer]   Social History Main Topics  . Smoking status: Former Smoker    Quit date: 12/16/2002  . Smokeless tobacco: Never Used  . Alcohol Use: Yes     2-3x/week  . Drug Use: No  . Sexually Active: Not Currently   Other Topics Concern  . Not on file   Social History Narrative   Regular exercise-yesCaffeine Use-yes    Current Outpatient Prescriptions on File Prior to Visit  Medication Sig Dispense Refill  . aspirin EC 325 MG tablet Take 325 mg by mouth daily.      . Calcium Acetate 667 MG TABS Take 1,334 mg by mouth 3 (three) times daily with meals.       . clopidogrel (PLAVIX) 75 MG tablet Take 75 mg by mouth daily.        Marland Kitchen  fludrocortisone (FLORINEF) 0.1 MG tablet 2 pills daily  60 tablet  11  . FOSRENOL 1000 MG chewable tablet Chew 1,000 mg by mouth 3 (three) times daily with meals.       Marland Kitchen JOLIVETTE 0.35 MG tablet Take 1 tablet by mouth daily.       . midodrine (PROAMATINE) 10 MG tablet Take 1 tablet (10 mg total) by mouth 3 (three) times daily.  30 tablet  1  . Multiple Vitamins-Calcium (ONE-A-DAY WOMENS PO) Take 1 tablet by mouth daily.      . SENSIPAR 30 MG tablet Take 30 mg by mouth 2 (two) times daily.         No Known Allergies  Family History  Problem Relation Age of Onset  . Diabetes Mother   . Diabetes Father   . Hypertension Father   . Heart disease Maternal Grandmother   . Diabetes Maternal Grandmother   . Stroke Maternal Grandmother   . Hypertension Brother   . Hypertension Brother   . Hypertension Brother   . Cancer Other     Prostate Cancer-Uncle, Colon Cancer-Uncle    BP 128/80  Pulse 80  Temp 98.4 F (36.9 C) (Oral)  Wt 240 lb (108.863  kg)  SpO2 98%  Review of Systems Denies dizzines    Objective:   Physical Exam VITAL SIGNS:  See vs page GENERAL: no distress Ext: no edema     Assessment & Plan:  Hypotension, adrenal insuff excluded.  well-controlled

## 2012-10-30 ENCOUNTER — Telehealth: Payer: Self-pay

## 2012-10-30 NOTE — Telephone Encounter (Signed)
Message copied by Donata Duff on Fri Oct 30, 2012  3:51 PM ------      Message from: Rachael Fee      Created: Fri Oct 30, 2012  3:47 PM       She is my patient (dating back to 2007).  I am happy to see her, but only if she is referred back by her PCP for GI issues.  She no showed several times.                  ----- Message -----         From: Donata Duff, CMA         Sent: 10/30/2012   3:06 PM           To: Rachael Fee, MD            Dr Christella Hartigan this pt was seen by you and no showed several times and you state per the chart not to reschedule unless seen by PCP first the chart is on your desk to review.  The pt is on Dr Anselm Jungling schedule for 11/05/12.  Will you review and let me know whose pt this is and should they reschedule to you?

## 2012-10-30 NOTE — Telephone Encounter (Signed)
See Dr Christella Hartigan recommendation

## 2012-11-04 ENCOUNTER — Ambulatory Visit: Payer: Medicare Other | Admitting: Gastroenterology

## 2012-11-25 ENCOUNTER — Ambulatory Visit: Payer: Medicare Other | Admitting: Gastroenterology

## 2012-12-21 ENCOUNTER — Telehealth: Payer: Self-pay | Admitting: Gastroenterology

## 2012-12-21 NOTE — Telephone Encounter (Signed)
Message copied by Arna Snipe on Mon Dec 21, 2012  8:19 AM ------      Message from: Donata Duff      Created: Wed Nov 25, 2012  9:33 AM       Do not bill

## 2013-02-08 ENCOUNTER — Ambulatory Visit: Payer: 59 | Admitting: Nurse Practitioner

## 2013-02-17 ENCOUNTER — Encounter: Payer: Self-pay | Admitting: Nurse Practitioner

## 2013-02-17 ENCOUNTER — Ambulatory Visit (INDEPENDENT_AMBULATORY_CARE_PROVIDER_SITE_OTHER): Payer: 59 | Admitting: Nurse Practitioner

## 2013-02-17 VITALS — BP 128/76 | HR 68 | Ht 67.0 in | Wt 243.0 lb

## 2013-02-17 DIAGNOSIS — R079 Chest pain, unspecified: Secondary | ICD-10-CM

## 2013-02-17 NOTE — Progress Notes (Signed)
Judy Green Date of Birth: December 25, 1965 Medical Record #[id number]  History of Present Illness: Judy Green is seen back today for a work in visit. She is seen for Dr. Sw[place]il[place]. She has ESRD and is on chronic hemodialysis. Has had issues with orthostasis and is now on Midodrine. Echo with mild LVH and normal systolic function. She has HTN, OSA, anemia, prior breast cancer and is obese. Has HLD and is not on lipid lowering therapy. Has been adrenally insufficient in the past due to chronic steroid therapy. No known CAD reported.   She comes in today. She is here alone. She was sent here by Nephrology. She had a spell last month where she was on the dialysis machine. Her right arm went numb. She had shooting pains down her right arm. She told the nurse who cut the machine back. Then developed chest pain and low BP and says she passed out because of the low BP. Was treated with fluids. No further problems. Has done fine since with no recurrence. Tries to stay active on her non-dialysis days. No other chest pain reported. BP has otherwise been doing great with her Midodrine. She is not dizzy at all. In general, she feels pretty good. She will be put back on the transplant list here in June since she is 5 years out from her breast cancer diagnosis. She will need a stress test for this process. Her cardiac risk factors include HTN, HLD and obesity.   Current Outpatient Prescriptions on File Prior to Visit  Medication Sig Dispense Refill  . aspirin EC 325 MG tablet Take 325 mg by mouth daily.      . Calcium Acetate 667 MG TABS Take 1,334 mg by mouth 3 (three) times daily with meals.       . clopidogrel (PLAVIX) 75 MG tablet Take 75 mg by mouth daily.        . fludrocortisone (FLORINEF) 0.1 MG tablet 2 pills daily  60 tablet  11  . FOSRENOL 1000 MG chewable tablet Chew 1,000 mg by mouth 3 (three) times daily with meals.       Marland Kitchen JOLIVETTE 0.35 MG tablet Take 1 tablet by mouth daily.       . midodrine  (PROAMATINE) 10 MG tablet Take 1 tablet (10 mg total) by mouth 3 (three) times daily.  30 tablet  1  . Multiple Vitamins-Calcium (ONE-A-DAY WOMENS PO) Take 1 tablet by mouth daily.      . SENSIPAR 30 MG tablet Take 30 mg by mouth 2 (two) times daily.        No current facility-administered medications on file prior to visit.    No Known Allergies  Past Medical History  Diagnosis Date  . Chronic kidney disease     KIDNEY FAILURE  . ESRD (end stage renal disease)     Complications of transplanted kidney  . Rectal bleeding 2007    hemorrhoids/diverticular disease  . Retroperitoneal hematoma 9/07    transfusion  . H/O parathyroidectomy 1999  . UTI (lower urinary tract infection)   . Adrenal insufficiency 2009  . Sleep apnea     does not have cpap at this time  . Oral contraceptive use     patient states has not had menstral cycle in 1 year.  . Hypertension     Dr. Renford Dills [phone]  . Chronic headaches   . H/O syncope   . Breast cancer     RT  . Anemia  hx of  . Renal insufficiency     Past Surgical History  Procedure Laterality Date  . Av fistula placement  05/08/2007    Right AVG  . Hero avg  12/14/2008    Left   . Hero  04/17/2009    Removed clotted Left Hero  2) New Right Hero placed and Stent to SVC  . Lumpectomy      left breast  . Exploration renal s/p transplantation    . Tubal ligation    . Avgg removal  08/07/2012    Procedure: REMOVAL OF ARTERIOVENOUS GORETEX GRAFT (AVGG);  Surgeon: Larina Earthly, MD;  Location: [facility] OR;  Service: Vascular;  Laterality: Right;    History  Smoking status  . Former Smoker  . Quit date: 12/16/2002  Smokeless tobacco  . Never Used    History  Alcohol Use  . Yes    Comment: 2-3x/week    Family History  Problem Relation Age of Onset  . Diabetes Mother   . Diabetes Father   . Hypertension Father   . Heart disease Maternal Grandmother   . Diabetes Maternal Grandmother   . Stroke Maternal Grandmother   .  Hypertension Brother   . Hypertension Brother   . Hypertension Brother   . Cancer Other     Prostate Cancer-Uncle, Colon Cancer-Uncle    Review of Systems: The review of systems is per the HPI.  All other systems were reviewed and are negative.  Physical Exam: BP 128/76  Pulse 68  Ht 5\' 7"  (1.702 m)  Wt 243 lb (110.224 kg)  BMI 38.05 kg/m2 Patient is very pleasant and in no acute distress. Skin is warm and dry. Color is normal.  HEENT is unremarkable. Normocephalic/atraumatic. PERRL. Sclera are nonicteric. Neck is supple. No masses. No JVD. Lungs are clear. Cardiac exam shows a regular rate and rhythm. Abdomen is soft. Extremities are without edema. Gait and ROM are intact. No gross neurologic deficits noted.  LABORATORY DATA: EKG today shows sinus rhythm and is normal.   Lab Results  Component Value Date   WBC 6.8 09/05/2012   HGB 10.5* 09/05/2012   HCT 33.5* 09/05/2012   PLT 172 09/05/2012   GLUCOSE 96 09/05/2012   CHOL 236* 09/03/2012   TRIG 257* 09/03/2012   HDL 60 09/03/2012   LDLCALC 125* 09/03/2012   ALT 15 09/03/2012   AST 20 09/03/2012   NA 133* 09/05/2012   K 4.6 09/05/2012   CL 91* 09/05/2012   CREATININE 12.07* 09/05/2012   BUN 47* 09/05/2012   CO2 28 09/05/2012   TSH 1.513 09/03/2012   INR 1.11 08/07/2012   HGBA1C 5.3 09/03/2012   Assessment / Plan: 1. Right arm pain/Chest pain - only one episode with no recurrence. She is planning on getting back on the transplant list. Will arrange for a Lexiscan as part of her clearance process.   2. HTN - BP is doing well. I have left her on her current regimen for now. I do not see where we need to make a change at this time.   3. ESRD - on dialysis with plans to get back on the transplant list.   4. Orthostasis - on Midodrine - I think her BP looks ok.   We will see her back prn.   Patient is agreeable to this plan and will call if any problems develop in the interim.

## 2013-02-17 NOTE — Patient Instructions (Addendum)
Stay on your current medicines  We are going to arrange for a stress test Eugenie Birks)  We will see you back as needed  Call the [facility] office at [phone] if you have any questions, problems or concerns.

## 2013-03-01 ENCOUNTER — Ambulatory Visit (HOSPITAL_COMMUNITY): Payer: Medicare Other | Attending: Cardiology | Admitting: Radiology

## 2013-03-01 VITALS — BP 94/58 | Ht 67.0 in | Wt 242.0 lb

## 2013-03-01 DIAGNOSIS — R079 Chest pain, unspecified: Secondary | ICD-10-CM | POA: Insufficient documentation

## 2013-03-01 MED ORDER — TECHNETIUM TC 99M SESTAMIBI GENERIC - CARDIOLITE
33.0000 | Freq: Once | INTRAVENOUS | Status: AC | PRN
  Administered 2013-03-01: 33 via INTRAVENOUS

## 2013-03-01 MED ORDER — REGADENOSON 0.4 MG/5ML IV SOLN
0.4000 mg | Freq: Once | INTRAVENOUS | Status: AC
  Administered 2013-03-01: 0.4 mg via INTRAVENOUS

## 2013-03-01 NOTE — Progress Notes (Signed)
[facility] SITE 3 NUCLEAR MED [address] [place], [place] [address] [phone]    Cardiology Nuclear Med Study  Judy Green is a 47 y.o. female     MRN : [id number]     DOB: [birth year]  Procedure Date: 03/01/2013  Nuclear Med Background Indication for Stress Test:  Evaluation for Ischemia and Pending Renal Transplant and H/O Breast CA (L) Breast, No Hx of CAD History:  07/31/12 ECHO: mild LVH NL LVF EF: 65-70%, ESRD Cardiac Risk Factors: History of Smoking, Hypertension and Lipids  Symptoms:  Chest Pain, Fatigue, SOB and Syncope   Nuclear Pre-Procedure Caffeine/Decaff Intake:  None NPO After: 10:00pm   Lungs:  clear O2 Sat: 95% on room air. IV 0.9% NS with Angio Cath:  22g  IV Site: L Hand  IV Started by:  Doyne Keel, CNMT  Chest Size (in):  46 Cup Size: C  Height: 5\' 7"  (1.702 m)  Weight:  242 lb (109.77 kg)  BMI:  Body mass index is 37.89 kg/(m^2). Tech Comments:  No Rx this am    Nuclear Med Study 1 or 2 day study: 2 day  Stress Test Type:  Lexiscan  Reading MD: Tinnie Gens Agatha Duplechain,M.D. Order Authorizing Provider:  Peter Swaziland MD  Resting Radionuclide: Technetium 63m Sestamibi  Resting Radionuclide Dose: 33.0 mCi   On    03-03-13  Stress Radionuclide:  Technetium 72m Sestamibi  Stress Radionuclide Dose: 33.0 mCi  On       03-01-13          Stress Protocol Rest HR: 86 Stress HR: 91  Rest BP: 94/58 Stress BP: 94/58  Exercise Time (min): n/a METS: n/a   Predicted Max HR: 172 bpm % Max HR: 52.91 bpm Rate Pressure Product: 8554   Dose of Adenosine (mg):  n/a Dose of Lexiscan: 0.4 mg  Dose of Atropine (mg): n/a Dose of Dobutamine: n/a mcg/kg/min (at max HR)  Stress Test Technologist: Milana Na, EMT-P  Nuclear Technologist:  Domenic Polite, CNMT     Rest Procedure:  Myocardial perfusion imaging was performed at rest 45 minutes following the intravenous administration of Technetium 68m Sestamibi. Rest ECG: Normal sinus rhythm  Stress  Procedure:  The patient received IV Lexiscan 0.4 mg over 15-seconds.  Technetium 54m Sestamibi injected at 30-seconds. This patient had sob and was lt. Headed with the Lexiscan infusion. Quantitative spect images were obtained after a 45 minute delay. Stress ECG:  No significant change with stress  QPS Raw Data Images:  Normal; no motion artifact; normal heart/lung ratio. Stress Images:   There is interference from nuclear activity from structures below the diaphragm. This affects the stress image slightly more than the rest image. In the a stress image there is a lumpy bumpy appearance of the inferior wall. This is an artifact. Rest Images:  The rest images reveal no diagnostic abnormalities. Subtraction (SDS):  No evidence of ischemia. Transient Ischemic Dilatation (Normal <1.22):  1.05 Lung/Heart Ratio (Normal <0.45):  0.11  Quantitative Gated Spect Images QGS EDV:  93 ml QGS ESV:  32 ml  Impression Exercise Capacity:  Lexiscan with no exercise. BP Response:  Normal blood pressure response. Clinical Symptoms:  chest tightness ECG Impression:  No significant ST segment change suggestive of ischemia. Comparison with Prior Nuclear Study: No images to compare  Overall Impression:  Normal stress nuclear study. There is no significant abnormality. There is no sign of scar or ischemia. The images are affected by a nuclear activity from structures  below the diaphragm. This is a low risk scan  LV Ejection Fraction: 66%.  LV Wall Motion:  There is normal wall motion.  Willa Rough, MD

## 2013-03-03 ENCOUNTER — Ambulatory Visit (HOSPITAL_COMMUNITY): Payer: Medicare Other | Attending: Cardiology

## 2013-03-03 DIAGNOSIS — R0989 Other specified symptoms and signs involving the circulatory and respiratory systems: Secondary | ICD-10-CM

## 2013-03-03 MED ORDER — TECHNETIUM TC 99M SESTAMIBI GENERIC - CARDIOLITE
33.0000 | Freq: Once | INTRAVENOUS | Status: AC | PRN
  Administered 2013-03-03: 33 via INTRAVENOUS

## 2013-03-17 ENCOUNTER — Ambulatory Visit: Payer: Medicare Other | Admitting: Obstetrics & Gynecology

## 2013-04-15 ENCOUNTER — Encounter: Payer: Self-pay | Admitting: Obstetrics & Gynecology

## 2013-09-17 ENCOUNTER — Other Ambulatory Visit: Payer: Self-pay | Admitting: Endocrinology

## 2013-10-22 ENCOUNTER — Other Ambulatory Visit: Payer: Self-pay | Admitting: Endocrinology

## 2013-10-30 ENCOUNTER — Other Ambulatory Visit: Payer: Self-pay | Admitting: Endocrinology

## 2013-11-10 ENCOUNTER — Ambulatory Visit: Payer: Medicare Other | Admitting: Obstetrics & Gynecology

## 2013-11-24 ENCOUNTER — Ambulatory Visit: Payer: Medicare Other | Admitting: Endocrinology

## 2013-12-01 ENCOUNTER — Ambulatory Visit: Payer: Medicare Other | Admitting: Endocrinology

## 2013-12-01 DIAGNOSIS — Z0289 Encounter for other administrative examinations: Secondary | ICD-10-CM

## 2013-12-13 ENCOUNTER — Encounter (HOSPITAL_COMMUNITY): Payer: Self-pay | Admitting: Emergency Medicine

## 2013-12-13 DIAGNOSIS — Z853 Personal history of malignant neoplasm of breast: Secondary | ICD-10-CM | POA: Insufficient documentation

## 2013-12-13 DIAGNOSIS — Z940 Kidney transplant status: Secondary | ICD-10-CM | POA: Insufficient documentation

## 2013-12-13 DIAGNOSIS — Z8719 Personal history of other diseases of the digestive system: Secondary | ICD-10-CM | POA: Insufficient documentation

## 2013-12-13 DIAGNOSIS — N186 End stage renal disease: Secondary | ICD-10-CM | POA: Insufficient documentation

## 2013-12-13 DIAGNOSIS — Z992 Dependence on renal dialysis: Secondary | ICD-10-CM | POA: Insufficient documentation

## 2013-12-13 DIAGNOSIS — Z8669 Personal history of other diseases of the nervous system and sense organs: Secondary | ICD-10-CM | POA: Insufficient documentation

## 2013-12-13 DIAGNOSIS — Z87891 Personal history of nicotine dependence: Secondary | ICD-10-CM | POA: Insufficient documentation

## 2013-12-13 DIAGNOSIS — Z9889 Other specified postprocedural states: Secondary | ICD-10-CM | POA: Insufficient documentation

## 2013-12-13 DIAGNOSIS — D649 Anemia, unspecified: Secondary | ICD-10-CM | POA: Insufficient documentation

## 2013-12-13 DIAGNOSIS — R109 Unspecified abdominal pain: Secondary | ICD-10-CM | POA: Insufficient documentation

## 2013-12-13 DIAGNOSIS — Z9189 Other specified personal risk factors, not elsewhere classified: Secondary | ICD-10-CM | POA: Insufficient documentation

## 2013-12-13 DIAGNOSIS — Z7982 Long term (current) use of aspirin: Secondary | ICD-10-CM | POA: Insufficient documentation

## 2013-12-13 DIAGNOSIS — G4730 Sleep apnea, unspecified: Secondary | ICD-10-CM | POA: Insufficient documentation

## 2013-12-13 DIAGNOSIS — Z79899 Other long term (current) drug therapy: Secondary | ICD-10-CM | POA: Insufficient documentation

## 2013-12-13 DIAGNOSIS — Z87440 Personal history of urinary (tract) infections: Secondary | ICD-10-CM | POA: Insufficient documentation

## 2013-12-13 DIAGNOSIS — R1110 Vomiting, unspecified: Secondary | ICD-10-CM | POA: Insufficient documentation

## 2013-12-13 DIAGNOSIS — Z7902 Long term (current) use of antithrombotics/antiplatelets: Secondary | ICD-10-CM | POA: Insufficient documentation

## 2013-12-13 DIAGNOSIS — Z3041 Encounter for surveillance of contraceptive pills: Secondary | ICD-10-CM | POA: Insufficient documentation

## 2013-12-13 DIAGNOSIS — I120 Hypertensive chronic kidney disease with stage 5 chronic kidney disease or end stage renal disease: Secondary | ICD-10-CM | POA: Insufficient documentation

## 2013-12-13 DIAGNOSIS — R9431 Abnormal electrocardiogram [ECG] [EKG]: Secondary | ICD-10-CM | POA: Insufficient documentation

## 2013-12-13 LAB — CBC WITH DIFFERENTIAL/PLATELET
Basophils Absolute: 0 10*3/uL (ref 0.0–0.1)
Eosinophils Relative: 1 % (ref 0–5)
HCT: 37.4 % (ref 36.0–46.0)
Hemoglobin: 12.3 g/dL (ref 12.0–15.0)
Lymphocytes Relative: 28 % (ref 12–46)
MCV: 95.2 fL (ref 78.0–100.0)
Monocytes Absolute: 0.8 10*3/uL (ref 0.1–1.0)
Monocytes Relative: 13 % — ABNORMAL HIGH (ref 3–12)
RDW: 14.4 % (ref 11.5–15.5)
WBC: 6.5 10*3/uL (ref 4.0–10.5)

## 2013-12-13 LAB — COMPREHENSIVE METABOLIC PANEL
BUN: 24 mg/dL — ABNORMAL HIGH (ref 6–23)
CO2: 27 mEq/L (ref 19–32)
Calcium: 8.7 mg/dL (ref 8.4–10.5)
Creatinine, Ser: 7.47 mg/dL — ABNORMAL HIGH (ref 0.50–1.10)
GFR calc Af Amer: 7 mL/min — ABNORMAL LOW (ref >90)
GFR calc non Af Amer: 6 mL/min — ABNORMAL LOW (ref >90)
Glucose, Bld: 91 mg/dL (ref 70–99)
Total Bilirubin: 0.4 mg/dL (ref 0.3–1.2)

## 2013-12-13 LAB — POCT I-STAT TROPONIN I: Troponin i, poc: 0.01 ng/mL (ref 0.00–0.08)

## 2013-12-13 LAB — LIPASE, BLOOD: Lipase: 52 U/L (ref 11–59)

## 2013-12-13 NOTE — ED Notes (Signed)
Pt. reports mid abdominal pain with nausea onset today after completing her hemodialysis treatment , denies emesis or diarrhea . No fever or chills.

## 2013-12-14 ENCOUNTER — Emergency Department (HOSPITAL_COMMUNITY)
Admission: EM | Admit: 2013-12-14 | Discharge: 2013-12-14 | Disposition: A | Discharge status: Home / Self Care | Payer: Medicare Other | Attending: Emergency Medicine | Admitting: Emergency Medicine

## 2013-12-14 ENCOUNTER — Emergency Department (HOSPITAL_COMMUNITY): Payer: Medicare Other

## 2013-12-14 DIAGNOSIS — R109 Unspecified abdominal pain: Secondary | ICD-10-CM

## 2013-12-14 MED ORDER — ONDANSETRON 4 MG PO TBDP: 4.0000 mg | ORAL_TABLET | Freq: Three times a day (TID) | ORAL | Status: DC | PRN

## 2013-12-14 MED ORDER — OXYCODONE-ACETAMINOPHEN 5-325 MG PO TABS: 2.0000 | ORAL_TABLET | ORAL | Status: DC | PRN

## 2013-12-14 MED ORDER — OXYCODONE-ACETAMINOPHEN 5-325 MG PO TABS
2.0000 | ORAL_TABLET | Freq: Once | ORAL | Status: AC
  Administered 2013-12-14: 2 via ORAL
  Filled 2013-12-14: qty 2

## 2013-12-14 NOTE — ED Provider Notes (Signed)
CSN: [id number]     Arrival date & time 12/13/13  2128 History   First MD Initiated Contact with Patient 12/14/13 [phone]     Chief Complaint  Patient presents with  . Abdominal Pain  . Nausea   (Consider location/radiation/quality/duration/timing/severity/associated sxs/prior Treatment) HPI Comments: OPt with ESRD, on dialysis - earlier on Monday - developed abd pain in the epigastrium and L side -= has hx of same - no dx in the past - always goes away with pain meds - today ahs assocaited nausea - no vomiting, no diarrhea, does not make urine.  She has had kidney transplant - failed.  Pain is moderate, no pain meds pta.  Patient is a 47 y.o. female presenting with abdominal pain. The history is provided by the patient.  Abdominal Pain   Past Medical History  Diagnosis Date  . Chronic kidney disease     KIDNEY FAILURE  . ESRD (end stage renal disease)     Complications of transplanted kidney  . Rectal bleeding 2007    hemorrhoids/diverticular disease  . Retroperitoneal hematoma 9/07    transfusion  . H/O parathyroidectomy 1999  . UTI (lower urinary tract infection)   . Adrenal insufficiency 2009  . Sleep apnea     does not have cpap at this time  . Oral contraceptive use     patient states has not had menstral cycle in 1 year.  . Hypertension     Dr. Renford Dills [phone]  . Chronic headaches   . H/O syncope   . Breast cancer     RT  . Anemia     hx of  . Renal insufficiency    Past Surgical History  Procedure Laterality Date  . Av fistula placement  05/08/2007    Right AVG  . Hero avg  12/14/2008    Left   . Hero  04/17/2009    Removed clotted Left Hero  2) New Right Hero placed and Stent to SVC  . Lumpectomy      left breast  . Exploration renal s/p transplantation    . Tubal ligation    . Avgg removal  08/07/2012    Procedure: REMOVAL OF ARTERIOVENOUS GORETEX GRAFT (AVGG);  Surgeon: Larina Earthly, MD;  Location: [facility] OR;  Service: Vascular;  Laterality: Right;    Family History  Problem Relation Age of Onset  . Diabetes Mother   . Diabetes Father   . Hypertension Father   . Heart disease Maternal Grandmother   . Diabetes Maternal Grandmother   . Stroke Maternal Grandmother   . Hypertension Brother   . Hypertension Brother   . Hypertension Brother   . Cancer Other     Prostate Cancer-Uncle, Colon Cancer-Uncle   History  Substance Use Topics  . Smoking status: Former Smoker    Quit date: 12/16/2002  . Smokeless tobacco: Never Used  . Alcohol Use: Yes     Comment: 2-3x/week   OB History   Grav Para Term Preterm Abortions TAB SAB Ect Mult Living                 Review of Systems  Gastrointestinal: Positive for abdominal pain.  All other systems reviewed and are negative.    Allergies  Review of patient's allergies indicates no known allergies.  Home Medications   Current Outpatient Rx  Name  Route  Sig  Dispense  Refill  . aspirin EC 325 MG tablet   Oral   Take 325 mg by  mouth daily.         . Calcium Acetate 667 MG TABS   Oral   Take 1,334 mg by mouth 3 (three) times daily with meals.          . clopidogrel (PLAVIX) 75 MG tablet   Oral   Take 75 mg by mouth daily.           Marland Kitchen JOLIVETTE 0.35 MG tablet   Oral   Take 1 tablet by mouth daily.          . midodrine (PROAMATINE) 10 MG tablet   Oral   Take 1 tablet (10 mg total) by mouth 3 (three) times daily.   30 tablet   1   . pseudoephedrine-acetaminophen (TYLENOL SINUS) 30-500 MG TABS   Oral   Take 1 tablet by mouth at bedtime as needed (allergies).         . SENSIPAR 30 MG tablet   Oral   Take 30 mg by mouth 2 (two) times daily.          . ondansetron (ZOFRAN ODT) 4 MG disintegrating tablet   Oral   Take 1 tablet (4 mg total) by mouth every 8 (eight) hours as needed for nausea.   10 tablet   0   . oxyCODONE-acetaminophen (PERCOCET/ROXICET) 5-325 MG per tablet   Oral   Take 2 tablets by mouth every 4 (four) hours as needed for severe  pain.   6 tablet   0    BP 105/71  Pulse 81  Temp(Src) 98.1 F (36.7 C) (Oral)  Resp 18  Ht 5\' 7"  (1.702 m)  Wt 245 lb (111.131 kg)  BMI 38.36 kg/m2  SpO2 95% Physical Exam  Nursing note and vitals reviewed. Constitutional: She appears well-developed and well-nourished. No distress.  HENT:  Head: Normocephalic and atraumatic.  Mouth/Throat: Oropharynx is clear and moist. No oropharyngeal exudate.  Eyes: Conjunctivae and EOM are normal. Pupils are equal, round, and reactive to light. Right eye exhibits no discharge. Left eye exhibits no discharge. No scleral icterus.  Neck: Normal range of motion. Neck supple. No JVD present. No thyromegaly present.  Cardiovascular: Normal rate, regular rhythm, normal heart sounds and intact distal pulses.  Exam reveals no gallop and no friction rub.   No murmur heard. Pulmonary/Chest: Effort normal and breath sounds normal. No respiratory distress. She has no wheezes. She has no rales.  Abdominal: Soft. Bowel sounds are normal. She exhibits no distension and no mass. There is tenderness ( mild ttp in the epigastrium and LUQ - has no guarding, - no peritoneal signs.  no tympanitic sounds to percussion.).  Musculoskeletal: Normal range of motion. She exhibits no edema and no tenderness.  Lymphadenopathy:    She has no cervical adenopathy.  Neurological: She is alert. Coordination normal.  Skin: Skin is warm and dry. No rash noted. No erythema.  Psychiatric: She has a normal mood and affect. Her behavior is normal.    ED Course  Procedures (including critical care time) Labs Review Labs Reviewed  CBC WITH DIFFERENTIAL - Abnormal; Notable for the following:    Monocytes Relative 13 (*)    All other components within normal limits  COMPREHENSIVE METABOLIC PANEL - Abnormal; Notable for the following:    Chloride 94 (*)    BUN 24 (*)    Creatinine, Ser 7.47 (*)    Total Protein 8.5 (*)    GFR calc non Af Amer 6 (*)    GFR calc Af Denyse Dago  7 (*)     All other components within normal limits  LIPASE, BLOOD  POCT I-STAT TROPONIN I   Imaging Review Dg Abd Acute W/chest  12/14/2013   CLINICAL DATA:  Upper abdominal pain.  Nausea.  EXAM: ACUTE ABDOMEN SERIES (ABDOMEN 2 VIEW & CHEST 1 VIEW)  COMPARISON:  Chest 08/07/2012  FINDINGS: Stable appearance of right central venous catheter and SVC stent. Normal heart size and pulmonary vascularity. Surgical clips in the base of the neck. No focal airspace disease or consolidation in the lungs. Linear scarring in the left mid lung.  Scattered gas and stool in the colon. No small or large bowel distention. No free intra-abdominal air. No abnormal air-fluid levels. No radiopaque stones. Calcified phleboliths in the pelvis. Bones appear intact.  IMPRESSION: Negative abdominal radiographs.  No acute cardiopulmonary disease.   Electronically Signed   By: Burman Nieves M.D.   On: 12/14/2013 04:42    EKG Interpretation    Date/Time:  Monday December 13 2013 21:52:05 EST Ventricular Rate:  84 PR Interval:  158 QRS Duration: 74 QT Interval:  414 QTC Calculation: 489 R Axis:   61 Text Interpretation:  Normal sinus rhythm Prolonged QT Abnormal ECG since last tracing no significant change Confirmed by Jas Betten  MD, Shaunika Italiano ([id number]) on 12/14/2013 2:23:45 AM            MDM   1. Abdominal pain    Pt has abd pain - r/o obstruction - abd exam benign as are the labs.  Labs are unremarkable, x-ray of the abdomen and pelvis shows no signs of bowel obstruction or free air. The patient has improved with a single dose of pain medications and is amenable to discharge with her family doctor. Given the location of her pain, normal lipase and liver function tests and no leukocytosis it makes pancreatitis or cholecystitis much less likely. She has had a renal transplant, she is no longer on prednisone but has had this pain intermittently, is not brought on in a postprandial fashion, I discussed with her the indications  for return and she has expressed her understanding.   Meds given in ED:  Medications  oxyCODONE-acetaminophen (PERCOCET/ROXICET) 5-325 MG per tablet 2 tablet (2 tablets Oral Given 12/14/13 0355)    New Prescriptions   ONDANSETRON (ZOFRAN ODT) 4 MG DISINTEGRATING TABLET    Take 1 tablet (4 mg total) by mouth every 8 (eight) hours as needed for nausea.   OXYCODONE-ACETAMINOPHEN (PERCOCET/ROXICET) 5-325 MG PER TABLET    Take 2 tablets by mouth every 4 (four) hours as needed for severe pain.      Vida Roller, MD 12/14/13 [phone]

## 2013-12-14 NOTE — ED Notes (Addendum)
Pt. Reports cramping intermittent midepigastric pain with SOB this AM. Pt. States pain has gotten better throughout the day but is still cramping. Pt. Alert and oriented x4, in no apparent distress at this time. Tender to touch. Denies radiation. Hx of same without diagnosed cause.

## 2013-12-17 ENCOUNTER — Encounter: Payer: Self-pay | Admitting: Gastroenterology

## 2014-01-04 ENCOUNTER — Other Ambulatory Visit: Payer: Self-pay | Admitting: Endocrinology

## 2014-01-04 ENCOUNTER — Other Ambulatory Visit: Payer: Self-pay | Admitting: *Deleted

## 2014-01-04 MED ORDER — FLUDROCORTISONE ACETATE 0.1 MG PO TABS: 0.2000 mg | ORAL_TABLET | Freq: Every day | ORAL | Status: DC

## 2014-01-04 NOTE — Telephone Encounter (Signed)
Please refill x 1 Ov is due  

## 2014-01-14 ENCOUNTER — Other Ambulatory Visit: Payer: Self-pay | Admitting: Internal Medicine

## 2014-01-14 DIAGNOSIS — Z1231 Encounter for screening mammogram for malignant neoplasm of breast: Secondary | ICD-10-CM

## 2014-01-14 DIAGNOSIS — Z9889 Other specified postprocedural states: Secondary | ICD-10-CM

## 2014-01-17 ENCOUNTER — Ambulatory Visit: Payer: Medicare Other

## 2014-01-18 ENCOUNTER — Ambulatory Visit (INDEPENDENT_AMBULATORY_CARE_PROVIDER_SITE_OTHER): Payer: Medicare Other | Admitting: Gastroenterology

## 2014-01-18 ENCOUNTER — Encounter: Payer: Self-pay | Admitting: Gastroenterology

## 2014-01-18 VITALS — BP 70/42 | HR 84 | Ht 67.0 in | Wt 243.2 lb

## 2014-01-18 DIAGNOSIS — K5900 Constipation, unspecified: Secondary | ICD-10-CM

## 2014-01-18 DIAGNOSIS — K625 Hemorrhage of anus and rectum: Secondary | ICD-10-CM

## 2014-01-18 MED ORDER — MOVIPREP 100 G PO SOLR: 1.0000 | Freq: Once | ORAL | Status: DC

## 2014-01-18 NOTE — Patient Instructions (Signed)
STart once daily miralax dosing, for constipation, abd pains. You will be set up for a colonoscopy for constipation, rectal bleeding.

## 2014-01-18 NOTE — Progress Notes (Signed)
HPI: This is a    Pleasant  48 year old woman whom I last saw many years ago.  She has no shoulder canceled numerous appointments since then.   Labs last month normal cbc, normal cmet except elevated Cr, normal lipase  She is here with abdominal pains.  Pain in epigastrium, intermittent.  Lasts 2-3 days, then terrible gas.  Then has BM with a lot of effort.  Pain so intense she went to ER last month. ABd films were neg.    She has terrible trouble with constipation,  Would go about 2 times per week if she didn't take something.  She sees blood daily.  8 years since last colonoscopy [facility].  Weight has been stable.    Review of systems: Pertinent positive and negative review of systems were noted in the above HPI section. Complete review of systems was performed and was otherwise normal.    Past Medical History  Diagnosis Date  . Chronic kidney disease     KIDNEY FAILURE  . ESRD (end stage renal disease)     Complications of transplanted kidney  . Rectal bleeding 2007    hemorrhoids/diverticular disease  . Retroperitoneal hematoma 9/07    transfusion  . H/O parathyroidectomy 1999  . UTI (lower urinary tract infection)   . Adrenal insufficiency 2009  . Sleep apnea     does not have cpap at this time  . Oral contraceptive use     patient states has not had menstral cycle in 1 year.  . Hypertension     Dr. Seward Carol [phone]  . Chronic headaches   . H/O syncope   . Breast cancer     RT  . Anemia     hx of  . Renal insufficiency     Past Surgical History  Procedure Laterality Date  . Av fistula placement Right 05/08/2007  . Hero avg Left 12/14/2008  . Hero  04/17/2009    Removed clotted Left Hero  2) New Right Hero placed and Stent to SVC  . Breast lumpectomy Left   . Exploration renal s/p transplantation    . Tubal ligation    . Avgg removal  08/07/2012    Procedure: REMOVAL OF ARTERIOVENOUS GORETEX GRAFT ([employer]);  Surgeon: Rosetta Posner, MD;  Location: [facility]  OR;  Service: Vascular;  Laterality: Right;    Current Outpatient Prescriptions  Medication Sig Dispense Refill  . aspirin EC 325 MG tablet Take 325 mg by mouth daily.      . Calcium Acetate 667 MG TABS Take 1,334 mg by mouth 3 (three) times daily with meals.       . clopidogrel (PLAVIX) 75 MG tablet Take 75 mg by mouth daily.        . fludrocortisone (FLORINEF) 0.1 MG tablet Take 2 tablets (0.2 mg total) by mouth daily.  60 tablet  1  . gabapentin (NEURONTIN) 100 MG capsule       . JOLIVETTE 0.35 MG tablet Take 1 tablet by mouth daily.       . midodrine (PROAMATINE) 10 MG tablet Take 1 tablet (10 mg total) by mouth 3 (three) times daily.  30 tablet  1  . SENSIPAR 30 MG tablet Take 30 mg by mouth 2 (two) times daily.        No current facility-administered medications for this visit.    Allergies as of 01/18/2014  . (No Known Allergies)    Family History  Problem Relation Age of Onset  . Diabetes  Mother   . Diabetes Maternal Aunt     x 3  . Colon cancer Maternal Uncle   . Heart disease Maternal Grandmother   . Diabetes Maternal Grandmother   . Stroke Maternal Grandmother   . Hypertension Brother   . Hypertension Brother   . Hypertension Brother   . Prostate cancer Maternal Uncle     History   Social History  . Marital Status: Married    Spouse Name: N/A    Number of Children: 2  . 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 Smoking status: Former Smoker    Quit date: 12/16/2002  . Smokeless tobacco: Never Used  . Alcohol Use: Yes     Comment: 2-3x/week  . Drug Use: No  . Sexual Activity: Not Currently   Other Topics Concern  . Not on file   Social History Narrative   Regular exercise-yes   Caffeine Use-yes             Physical Exam: BP 70/42  Pulse 84  Ht 5\' 7"  (1.702 m)  Wt 243 lb 3.2 oz (110.315 kg)  BMI 38.08 kg/m2 Constitutional: generally  well-appearing Psychiatric: alert and oriented x3 Eyes: extraocular movements intact Mouth: oral pharynx moist, no lesions Neck: supple no lymphadenopathy Cardiovascular: heart regular rate and rhythm Lungs: clear to auscultation bilaterally Abdomen: soft, nontender, nondistended, no obvious ascites, no peritoneal signs, normal bowel sounds Extremities: no lower extremity edema bilaterally Skin: no lesions on visible extremities    Assessment and plan: 48 y.o. female with  significant constipation, intermittent abdominal pain, rectal bleeding  I think that her significant constipation who is driving her abdominal pain and certainly driving her rectal bleeding. She has not had colonoscopy in 8 years that she repeat repeated now to rule out other causes such as neoplasm, structural causes. In the meantime she will be in MiraLax one dose daily.  She is currently on Plavix and we'll ask her prescribing physician if it is okay that she hold her for 5 days prior to colonoscopy

## 2014-01-25 ENCOUNTER — Telehealth: Payer: Self-pay

## 2014-01-25 NOTE — Telephone Encounter (Signed)
I spoke with Dr Shelva Majestic office and they will get the letter to an MD to review.  She will get me a response ASAP

## 2014-01-25 NOTE — Telephone Encounter (Signed)
refaxed letter to Dr Moshe Cipro

## 2014-01-25 NOTE — Telephone Encounter (Signed)
Message copied by Barron Alvine on Tue Jan 25, 2014  9:24 AM ------      Message from: Barron Alvine      Created: Tue Jan 18, 2014  3:11 PM       Need anticoag response from Dr Moshe Cipro ------

## 2014-01-26 NOTE — Telephone Encounter (Signed)
I spoke with Tamira RN at the dialysis center regarding the pt plavix.  She will call the pt and have her hold plavix tomorrow and fax a letter stating that it is ok to remain off until after the procedrue

## 2014-02-01 ENCOUNTER — Encounter: Payer: Medicare Other | Admitting: Gastroenterology

## 2014-02-04 ENCOUNTER — Ambulatory Visit: Payer: Medicare Other

## 2014-02-07 ENCOUNTER — Ambulatory Visit
Admission: RE | Admit: 2014-02-07 | Discharge: 2014-02-07 | Disposition: A | Discharge status: Home / Self Care | Payer: Medicare Other | Source: Ambulatory Visit | Attending: Internal Medicine | Admitting: Internal Medicine

## 2014-02-07 ENCOUNTER — Other Ambulatory Visit: Payer: Self-pay | Admitting: Internal Medicine

## 2014-02-07 ENCOUNTER — Ambulatory Visit
Admission: RE | Admit: 2014-02-07 | Discharge: 2014-02-07 | Disposition: A | Discharge status: Home / Self Care | Payer: 59 | Source: Ambulatory Visit | Attending: Internal Medicine | Admitting: Internal Medicine

## 2014-02-07 DIAGNOSIS — Z1231 Encounter for screening mammogram for malignant neoplasm of breast: Secondary | ICD-10-CM

## 2014-02-07 DIAGNOSIS — Z853 Personal history of malignant neoplasm of breast: Secondary | ICD-10-CM

## 2014-02-07 DIAGNOSIS — Z9889 Other specified postprocedural states: Secondary | ICD-10-CM

## 2014-02-21 ENCOUNTER — Telehealth: Payer: Self-pay | Admitting: Gastroenterology

## 2014-02-21 NOTE — Telephone Encounter (Signed)
Patient ate cucumber and tomatoes yesterday.   I told her that it would be fine,but to stay on clear liquids today and until she has her procedure tomorrow.   Told her to follow the directions carefully, and to take her prep as directed. She agreed

## 2014-02-22 ENCOUNTER — Ambulatory Visit (AMBULATORY_SURGERY_CENTER): Payer: Medicare Other | Admitting: Gastroenterology

## 2014-02-22 ENCOUNTER — Encounter: Payer: Self-pay | Admitting: Gastroenterology

## 2014-02-22 VITALS — BP 115/53 | HR 81 | Temp 98.6°F | Resp 16 | Ht 67.0 in | Wt 243.0 lb

## 2014-02-22 DIAGNOSIS — K625 Hemorrhage of anus and rectum: Secondary | ICD-10-CM

## 2014-02-22 DIAGNOSIS — D126 Benign neoplasm of colon, unspecified: Secondary | ICD-10-CM

## 2014-02-22 DIAGNOSIS — K644 Residual hemorrhoidal skin tags: Secondary | ICD-10-CM

## 2014-02-22 DIAGNOSIS — K648 Other hemorrhoids: Secondary | ICD-10-CM

## 2014-02-22 DIAGNOSIS — K5900 Constipation, unspecified: Secondary | ICD-10-CM

## 2014-02-22 MED ORDER — SODIUM CHLORIDE 0.9 % IV SOLN: 500.0000 mL | INTRAVENOUS | Status: DC

## 2014-02-22 NOTE — Op Note (Signed)
[place]  [address]. [place], [address]   COLONOSCOPY PROCEDURE REPORT  PATIENT: Judy Green, Judy Green  MR#: [id number] BIRTHDATE: 02-18-1966 , 47  yrs. old GENDER: Female ENDOSCOPIST: Milus Banister, MD REFERRED FB:[id number] Polite, M.D. PROCEDURE DATE:  02/22/2014 PROCEDURE:   Colonoscopy with snare polypectomy First Screening Colonoscopy - Avg.  risk and is 50 yrs.  old or older - No.  Prior Negative Screening - Now for repeat screening. N/A  History of Adenoma - Now for follow-up colonoscopy & has been > or = to 3 yrs.  N/A  Polyps Removed Today? Yes. ASA CLASS:   Class III INDICATIONS:constipation, rectal bleeding, abdominal pain. MEDICATIONS: MAC sedation, administered by CRNA and Propofol (Diprivan) 360 mg IV  DESCRIPTION OF PROCEDURE:   After the risks benefits and alternatives of the procedure were thoroughly explained, informed consent was obtained.  A digital rectal exam revealed no abnormalities of the rectum.   The LB [id number]  endoscope was introduced through the anus and advanced to the cecum, which was identified by both the appendix and ileocecal valve. No adverse events experienced.   The quality of the prep was excellent.  The instrument was then slowly withdrawn as the colon was fully examined.  COLON FINDINGS: One polyp was found, removed and sent to pathology. This was sessile, 1.1cm across, located on the distal aspect of the IC valve, removed with snare/cautery.  There were medium sized internal and external hemorrhoids.  Retroflexed views revealed no abnormalities. The time to cecum=4 minutes 42 seconds.  Withdrawal time=9 minutes 49 seconds.  The scope was withdrawn and the procedure completed. COMPLICATIONS: There were no complications.  ENDOSCOPIC IMPRESSION: One polyp was found, removed and sent to pathology. There were medium sized internal and external hemorrhoids.  RECOMMENDATIONS: 1. If the polyp(s) removed  today are proven to be adenomatous (pre-cancerous) polyps, you will need a colonoscopy in 3 years. Otherwise you should continue to follow colorectal cancer screening guidelines for "routine risk" patients with a colonoscopy in 10 years.  You will receive a letter within 1-2 weeks with the results of your biopsy as well as final recommendations.  Please call my office if you have not received a letter after 3 weeks. 2. As discussed in the office, you should start once daily miralax powder to help with your chronic constipation.  eSigned:  Milus Banister, MD 02/22/2014 4:11 PM

## 2014-02-22 NOTE — Progress Notes (Signed)
A/ox3 pleased with MAC, report to Karen RN 

## 2014-02-22 NOTE — Progress Notes (Signed)
Called to room to assist during endoscopic procedure.  Patient ID and intended procedure confirmed with present staff. Received instructions for my participation in the procedure from the performing physician.  

## 2014-02-22 NOTE — Patient Instructions (Addendum)
RESTART YOUR PLAVIX TOMORROW, 02/23/14.    YOU HAD AN ENDOSCOPIC PROCEDURE TODAY AT [facility]: Refer to the procedure report that was given to you for any specific questions about what was found during the examination.  If the procedure report does not answer your questions, please call your gastroenterologist to clarify.  If you requested that your care partner not be given the details of your procedure findings, then the procedure report has been included in a sealed envelope for you to review at your convenience later.  YOU SHOULD EXPECT: Some feelings of bloating in the abdomen. Passage of more gas than usual.  Walking can help get rid of the air that was put into your GI tract during the procedure and reduce the bloating. If you had a lower endoscopy (such as a colonoscopy or flexible sigmoidoscopy) you may notice spotting of blood in your stool or on the toilet paper. If you underwent a bowel prep for your procedure, then you may not have a normal bowel movement for a few days.  DIET: Your first meal following the procedure should be a light meal and then it is ok to progress to your normal diet.  A half-sandwich or bowl of soup is an example of a good first meal.  Heavy or fried foods are harder to digest and may make you feel nauseous or bloated.  Likewise meals heavy in dairy and vegetables can cause extra gas to form and this can also increase the bloating.  Drink plenty of fluids but you should avoid alcoholic beverages for 24 hours.  ACTIVITY: Your care partner should take you home directly after the procedure.  You should plan to take it easy, moving slowly for the rest of the day.  You can resume normal activity the day after the procedure however you should NOT DRIVE or use heavy machinery for 24 hours (because of the sedation medicines used during the test).    SYMPTOMS TO REPORT IMMEDIATELY: A gastroenterologist can be reached at any hour.  During normal business  hours, 8:30 AM to 5:00 PM Monday through Friday, call [phone].  After hours and on weekends, please call the GI answering service at [phone] who will take a message and have the physician on call contact you.   Following lower endoscopy (colonoscopy or flexible sigmoidoscopy):  Excessive amounts of blood in the stool  Significant tenderness or worsening of abdominal pains  Swelling of the abdomen that is new, acute  Fever of 100F or higher  FOLLOW UP: If any biopsies were taken you will be contacted by phone or by letter within the next 1-3 weeks.  Call your gastroenterologist if you have not heard about the biopsies in 3 weeks.  Our staff will call the home number listed on your records the next business day following your procedure to check on you and address any questions or concerns that you may have at that time regarding the information given to you following your procedure. This is a courtesy call and so if there is no answer at the home number and we have not heard from you through the emergency physician on call, we will assume that you have returned to your regular daily activities without incident.  SIGNATURES/CONFIDENTIALITY: You and/or your care partner have signed paperwork which will be entered into your electronic medical record.  These signatures attest to the fact that that the information above on your After Visit Summary has been reviewed and is understood.  Full responsibility of the confidentiality of this discharge information lies with you and/or your care-partner.

## 2014-02-23 ENCOUNTER — Telehealth: Payer: Self-pay

## 2014-02-23 NOTE — Telephone Encounter (Signed)
Left message on answering machine. 

## 2014-03-02 ENCOUNTER — Encounter: Payer: Self-pay | Admitting: Gastroenterology

## 2014-03-14 ENCOUNTER — Other Ambulatory Visit: Payer: Self-pay | Admitting: Endocrinology

## 2014-03-14 NOTE — Telephone Encounter (Signed)
Please refill x 1 Ov is due  

## 2014-05-10 ENCOUNTER — Other Ambulatory Visit: Payer: Self-pay | Admitting: Endocrinology

## 2014-05-10 NOTE — Telephone Encounter (Signed)
Please advise if ok to refill. Pt has not been to the office since 2013.

## 2014-05-10 NOTE — Telephone Encounter (Signed)
Please refill x 1 Ov is due  

## 2014-06-01 ENCOUNTER — Other Ambulatory Visit: Payer: Self-pay | Admitting: Obstetrics & Gynecology

## 2014-06-08 ENCOUNTER — Other Ambulatory Visit: Payer: Self-pay | Admitting: Obstetrics & Gynecology

## 2014-06-15 ENCOUNTER — Other Ambulatory Visit: Payer: Self-pay | Admitting: Internal Medicine

## 2014-06-15 ENCOUNTER — Ambulatory Visit
Admission: RE | Admit: 2014-06-15 | Discharge: 2014-06-15 | Disposition: A | Discharge status: Home / Self Care | Payer: Medicare Other | Source: Ambulatory Visit | Attending: Internal Medicine | Admitting: Internal Medicine

## 2014-06-15 DIAGNOSIS — M25551 Pain in right hip: Secondary | ICD-10-CM

## 2014-06-21 ENCOUNTER — Other Ambulatory Visit: Payer: Self-pay | Admitting: Endocrinology

## 2014-06-22 ENCOUNTER — Ambulatory Visit: Payer: Medicare Other | Admitting: Obstetrics & Gynecology

## 2014-06-22 NOTE — Telephone Encounter (Signed)
Rx refilled.

## 2014-06-22 NOTE — Telephone Encounter (Signed)
Refill x 3 months Ov is due

## 2014-06-23 ENCOUNTER — Ambulatory Visit (INDEPENDENT_AMBULATORY_CARE_PROVIDER_SITE_OTHER): Payer: Medicare Other | Admitting: Obstetrics & Gynecology

## 2014-06-23 ENCOUNTER — Encounter: Payer: Self-pay | Admitting: Obstetrics & Gynecology

## 2014-06-23 VITALS — BP 94/65 | HR 98 | Temp 98.6°F | Wt 242.0 lb

## 2014-06-23 DIAGNOSIS — N840 Polyp of corpus uteri: Secondary | ICD-10-CM

## 2014-06-23 DIAGNOSIS — D259 Leiomyoma of uterus, unspecified: Secondary | ICD-10-CM

## 2014-06-24 LAB — PAP IG AND HPV HIGH-RISK: HPV DNA High Risk: NOT DETECTED

## 2014-06-24 NOTE — Patient Instructions (Signed)

## 2014-06-24 NOTE — Progress Notes (Addendum)
Subjective:     Judy Green is a 48 y.o. female here for a routine exam.  Current complaints: none.    Personal health questionnaire:  Is patient Ashkenazi Jewish, have a family history of breast and/or ovarian cancer: yes--personal history Is there a family history of uterine cancer diagnosed at age < 29, gastrointestinal cancer, urinary tract cancer, family member who is a Field seismologist syndrome-associated carrier: no Is the patient overweight and hypertensive, family history of diabetes, personal history of gestational diabetes or PCOS: yes Is patient over 36, have PCOS,  family history of premature CHD under age 50, diabetes, smoke, have hypertension or peripheral artery disease:  yes   Gynecologic History No LMP recorded. Patient is not currently having periods (Reason: Oral contraceptives). Contraception: OCP (estrogen/progesterone) Last Pap results were: normal Last mammogram results were: normal  Obstetric History OB History  No data available    Past Medical History  Diagnosis Date  . Chronic kidney disease     KIDNEY FAILURE  . ESRD (end stage renal disease)     Complications of transplanted kidney  . Rectal bleeding 2007    hemorrhoids/diverticular disease  . Retroperitoneal hematoma 9/07    transfusion  . H/O parathyroidectomy 1999  . UTI (lower urinary tract infection)   . Adrenal insufficiency 2009  . Sleep apnea     does not have cpap at this time  . Oral contraceptive use     patient states has not had menstral cycle in 1 year.  . Hypertension     Dr. Seward Carol [phone]  . Chronic headaches   . H/O syncope   . Breast cancer     RT  . Anemia     hx of  . Renal insufficiency     Past Surgical History  Procedure Laterality Date  . Av fistula placement Right 05/08/2007  . Hero avg Left 12/14/2008  . Hero  04/17/2009    Removed clotted Left Hero  2) New Right Hero placed and Stent to SVC  . Breast lumpectomy Left   . Exploration renal s/p  transplantation    . Tubal ligation    . Avgg removal  08/07/2012    Procedure: REMOVAL OF ARTERIOVENOUS GORETEX GRAFT ([place]);  Surgeon: Rosetta Posner, MD;  Location: [facility] OR;  Service: Vascular;  Laterality: Right;    Current outpatient prescriptions:aspirin EC 325 MG tablet, Take 325 mg by mouth daily., Disp: , Rfl: ;  Calcium Acetate 667 MG TABS, Take 1,334 mg by mouth 3 (three) times daily with meals. , Disp: , Rfl: ;  clopidogrel (PLAVIX) 75 MG tablet, Take 75 mg by mouth daily.  , Disp: , Rfl: ;  fludrocortisone (FLORINEF) 0.1 MG tablet, TAKE 2 TABLETS BY MOUTH DAILY, Disp: 60 tablet, Rfl: 2;  gabapentin (NEURONTIN) 100 MG capsule, , Disp: , Rfl:  JOLIVETTE 0.35 MG tablet, TAKE ONE TABLET BY MOUTH DAILY, Disp: 28 tablet, Rfl: 0;  midodrine (PROAMATINE) 10 MG tablet, Take 1 tablet (10 mg total) by mouth 3 (three) times daily., Disp: 30 tablet, Rfl: 1;  SENSIPAR 30 MG tablet, Take 30 mg by mouth 2 (two) times daily. , Disp: , Rfl: ;  JOLIVETTE 0.35 MG tablet, TAKE ONE TABLET BY MOUTH DAILY, Disp: 28 tablet, Rfl: 0 No Known Allergies  History  Substance Use Topics  . Smoking status: Former Smoker    Quit date: 12/16/2002  . Smokeless tobacco: Never Used  . Alcohol Use: Yes     Comment: 2-3x/week  Family History  Problem Relation Age of Onset  . Diabetes Mother   . Diabetes Maternal Aunt     x 3  . Colon cancer Maternal Uncle   . Heart disease Maternal Grandmother   . Diabetes Maternal Grandmother   . Stroke Maternal Grandmother   . Hypertension Brother   . Hypertension Brother   . Hypertension Brother   . Prostate cancer Maternal Uncle       Review of Systems  Constitutional: negative for fatigue and weight loss Respiratory: negative for cough and wheezing Cardiovascular: negative for chest pain, fatigue and palpitations Gastrointestinal: negative for abdominal pain and change in bowel habits Musculoskeletal:negative for myalgias Neurological: negative for gait problems and  tremors Behavioral/Psych: negative for abusive relationship, depression Endocrine: negative for temperature intolerance   Genitourinary:negative for abnormal menstrual periods, genital lesions, hot flashes, sexual problems and vaginal discharge Integument/breast: negative for breast lump, breast tenderness, nipple discharge and skin lesion(s)    Objective:       BP 94/65  Pulse 98  Temp(Src) 98.6 F (37 C)  Wt 109.77 kg (242 lb) General:   alert  Skin:   no rash or abnormalities  Lungs:   clear to auscultation bilaterally  Heart:   regular rate and rhythm, S1, S2 normal, no murmur, click, rub or gallop  Breasts:   normal without suspicious masses, skin or nipple changes or axillary nodes  Abdomen:  normal findings: no organomegaly, soft, non-tender and no hernia  Pelvis:  External genitalia: normal general appearance Urinary system: urethral meatus normal and bladder without fullness, nontender Vaginal: normal without tenderness, induration or masses Cervix: normal appearance Adnexa: normal bimanual exam Uterus: anteverted and non-tender, normal size   Lab Review  Labs reviewed no Radiologic studies reviewed yes   Assessment:    Normal female exam.  H/O small fibroid uterus/?polyp   Plan:    Orders Placed This Encounter  Procedures  . US Pelvis Complete    Standing Status: Future     Number of Occurrences:      Standing Expiration Date: 08/25/2015    Order Specific Question:  Reason for Exam (SYMPTOM  OR DIAGNOSIS REQUIRED)    Answer:  fibroid/ possible polyp    Order Specific Question:  Preferred imaging location?    Answer:  [facility]  . US Transvaginal Non-OB    Standing Status: Future     Number of Occurrences:      Standing Expiration Date: 08/25/2015    Order Specific Question:  Reason for Exam (SYMPTOM  OR DIAGNOSIS REQUIRED)    Answer:  fibroid/ possible polyp    Order Specific Question:  Preferred imaging location?    Answer:  [facility]   . [place] Sonohysterogram    Standing Status: Future     Number of Occurrences:      Standing Expiration Date: 08/25/2015    Order Specific Question:  Reason for Exam (SYMPTOM  OR DIAGNOSIS REQUIRED)    Answer:  possible polyp- if polyp expected on US imaging please do sonohysterogram    Order Specific Question:  Preferred imaging location?    Answer:  [facility]   Need to obtain previous records Possible management options include: continue OCP x 1 yr or check [place] Follow up as needed.

## 2014-06-30 ENCOUNTER — Ambulatory Visit (HOSPITAL_COMMUNITY): Admission: RE | Admit: 2014-06-30 | Payer: Medicare Other | Source: Ambulatory Visit

## 2014-07-10 ENCOUNTER — Other Ambulatory Visit: Payer: Self-pay | Admitting: Obstetrics & Gynecology

## 2014-07-14 ENCOUNTER — Ambulatory Visit (HOSPITAL_COMMUNITY): Payer: Medicare Other

## 2014-07-18 ENCOUNTER — Other Ambulatory Visit: Payer: Self-pay | Admitting: Obstetrics & Gynecology

## 2014-07-18 ENCOUNTER — Ambulatory Visit (HOSPITAL_COMMUNITY)
Admission: RE | Admit: 2014-07-18 | Discharge: 2014-07-18 | Disposition: A | Discharge status: Home / Self Care | Payer: Medicare Other | Source: Ambulatory Visit | Attending: Obstetrics & Gynecology | Admitting: Obstetrics & Gynecology

## 2014-07-18 DIAGNOSIS — D259 Leiomyoma of uterus, unspecified: Secondary | ICD-10-CM | POA: Diagnosis present

## 2014-07-18 DIAGNOSIS — Z853 Personal history of malignant neoplasm of breast: Secondary | ICD-10-CM | POA: Insufficient documentation

## 2014-07-18 DIAGNOSIS — D252 Subserosal leiomyoma of uterus: Secondary | ICD-10-CM | POA: Insufficient documentation

## 2014-07-18 DIAGNOSIS — D251 Intramural leiomyoma of uterus: Secondary | ICD-10-CM | POA: Insufficient documentation

## 2014-07-19 ENCOUNTER — Encounter: Payer: Self-pay | Admitting: Obstetrics & Gynecology

## 2014-07-19 DIAGNOSIS — D259 Leiomyoma of uterus, unspecified: Secondary | ICD-10-CM | POA: Insufficient documentation

## 2014-07-28 ENCOUNTER — Ambulatory Visit: Payer: Medicare Other | Admitting: Obstetrics & Gynecology

## 2014-08-08 ENCOUNTER — Ambulatory Visit: Payer: Medicare Other | Admitting: Obstetrics & Gynecology

## 2014-08-26 ENCOUNTER — Other Ambulatory Visit: Payer: Self-pay | Admitting: Obstetrics & Gynecology

## 2014-08-26 NOTE — Telephone Encounter (Signed)
Please advise 

## 2014-08-28 NOTE — Telephone Encounter (Signed)
OK to refill

## 2014-08-29 NOTE — Telephone Encounter (Signed)
Request approved for pharmacy.

## 2014-09-19 ENCOUNTER — Ambulatory Visit (HOSPITAL_COMMUNITY)
Admission: RE | Admit: 2014-09-19 | Discharge: 2014-09-19 | Disposition: A | Discharge status: Home / Self Care | Payer: Medicare Other | Source: Ambulatory Visit | Attending: Interventional Radiology | Admitting: Interventional Radiology

## 2014-09-19 ENCOUNTER — Other Ambulatory Visit (HOSPITAL_COMMUNITY): Payer: Self-pay | Admitting: Interventional Radiology

## 2014-09-19 ENCOUNTER — Inpatient Hospital Stay (HOSPITAL_COMMUNITY)
Admission: RE | Admit: 2014-09-19 | Discharge: 2014-09-22 | DRG: 919 | Disposition: A | Discharge status: Home / Self Care | Payer: Medicare Other | Source: Ambulatory Visit | Attending: Nephrology | Admitting: Nephrology

## 2014-09-19 ENCOUNTER — Other Ambulatory Visit (HOSPITAL_COMMUNITY): Payer: Self-pay | Admitting: Nephrology

## 2014-09-19 ENCOUNTER — Encounter (HOSPITAL_COMMUNITY): Payer: Self-pay

## 2014-09-19 VITALS — BP 92/62 | HR 92 | Temp 98.0°F | Resp 18 | Ht 67.0 in | Wt 250.2 lb

## 2014-09-19 DIAGNOSIS — Z992 Dependence on renal dialysis: Secondary | ICD-10-CM

## 2014-09-19 DIAGNOSIS — Z940 Kidney transplant status: Secondary | ICD-10-CM | POA: Diagnosis not present

## 2014-09-19 DIAGNOSIS — I120 Hypertensive chronic kidney disease with stage 5 chronic kidney disease or end stage renal disease: Secondary | ICD-10-CM | POA: Diagnosis present

## 2014-09-19 DIAGNOSIS — I9589 Other hypotension: Secondary | ICD-10-CM | POA: Diagnosis present

## 2014-09-19 DIAGNOSIS — Y848 Other medical procedures as the cause of abnormal reaction of the patient, or of later complication, without mention of misadventure at the time of the procedure: Secondary | ICD-10-CM | POA: Diagnosis present

## 2014-09-19 DIAGNOSIS — Z79899 Other long term (current) drug therapy: Secondary | ICD-10-CM

## 2014-09-19 DIAGNOSIS — N186 End stage renal disease: Secondary | ICD-10-CM | POA: Diagnosis not present

## 2014-09-19 DIAGNOSIS — R58 Hemorrhage, not elsewhere classified: Secondary | ICD-10-CM

## 2014-09-19 DIAGNOSIS — I97618 Postprocedural hemorrhage and hematoma of a circulatory system organ or structure following other circulatory system procedure: Principal | ICD-10-CM | POA: Diagnosis present

## 2014-09-19 DIAGNOSIS — N2581 Secondary hyperparathyroidism of renal origin: Secondary | ICD-10-CM | POA: Diagnosis present

## 2014-09-19 DIAGNOSIS — S301XXA Contusion of abdominal wall, initial encounter: Secondary | ICD-10-CM

## 2014-09-19 DIAGNOSIS — E860 Dehydration: Secondary | ICD-10-CM | POA: Diagnosis not present

## 2014-09-19 DIAGNOSIS — Z87891 Personal history of nicotine dependence: Secondary | ICD-10-CM

## 2014-09-19 DIAGNOSIS — T82898A Other specified complication of vascular prosthetic devices, implants and grafts, initial encounter: Secondary | ICD-10-CM | POA: Diagnosis present

## 2014-09-19 DIAGNOSIS — Z7902 Long term (current) use of antithrombotics/antiplatelets: Secondary | ICD-10-CM | POA: Diagnosis not present

## 2014-09-19 DIAGNOSIS — D631 Anemia in chronic kidney disease: Secondary | ICD-10-CM | POA: Diagnosis not present

## 2014-09-19 DIAGNOSIS — Z793 Long term (current) use of hormonal contraceptives: Secondary | ICD-10-CM | POA: Diagnosis not present

## 2014-09-19 DIAGNOSIS — T8241XA Breakdown (mechanical) of vascular dialysis catheter, initial encounter: Secondary | ICD-10-CM | POA: Diagnosis present

## 2014-09-19 DIAGNOSIS — T829XXA Unspecified complication of cardiac and vascular prosthetic device, implant and graft, initial encounter: Secondary | ICD-10-CM

## 2014-09-19 DIAGNOSIS — Z853 Personal history of malignant neoplasm of breast: Secondary | ICD-10-CM

## 2014-09-19 DIAGNOSIS — G4730 Sleep apnea, unspecified: Secondary | ICD-10-CM | POA: Diagnosis not present

## 2014-09-19 DIAGNOSIS — E872 Acidosis: Secondary | ICD-10-CM | POA: Diagnosis not present

## 2014-09-19 DIAGNOSIS — Z8249 Family history of ischemic heart disease and other diseases of the circulatory system: Secondary | ICD-10-CM | POA: Diagnosis not present

## 2014-09-19 DIAGNOSIS — Z7982 Long term (current) use of aspirin: Secondary | ICD-10-CM

## 2014-09-19 DIAGNOSIS — R252 Cramp and spasm: Secondary | ICD-10-CM | POA: Diagnosis present

## 2014-09-19 DIAGNOSIS — Z7952 Long term (current) use of systemic steroids: Secondary | ICD-10-CM | POA: Diagnosis not present

## 2014-09-19 HISTORY — DX: Hypotension, unspecified: I95.9

## 2014-09-19 HISTORY — DX: Unspecified complication of cardiac and vascular prosthetic device, implant and graft, initial encounter: T82.9XXA

## 2014-09-19 LAB — CBC
HEMATOCRIT: 31 % — AB (ref 36.0–46.0)
HEMATOCRIT: 32 % — AB (ref 36.0–46.0)
HEMOGLOBIN: 10.5 g/dL — AB (ref 12.0–15.0)
Hemoglobin: 10.4 g/dL — ABNORMAL LOW (ref 12.0–15.0)
MCH: 30.4 pg (ref 26.0–34.0)
MCH: 29.7 pg (ref 26.0–34.0)
MCHC: 33.5 g/dL (ref 30.0–36.0)
MCHC: 32.8 g/dL (ref 30.0–36.0)
MCV: 90.6 fL (ref 78.0–100.0)
MCV: 90.7 fL (ref 78.0–100.0)
PLATELETS: 150 10*3/uL (ref 150–400)
Platelets: 147 10*3/uL — ABNORMAL LOW (ref 150–400)
RBC: 3.42 MIL/uL — AB (ref 3.87–5.11)
RBC: 3.53 MIL/uL — AB (ref 3.87–5.11)
RDW: 14.8 % (ref 11.5–15.5)
RDW: 14.8 % (ref 11.5–15.5)
WBC: 7.1 10*3/uL (ref 4.0–10.5)
WBC: 7.5 10*3/uL (ref 4.0–10.5)

## 2014-09-19 LAB — PROTIME-INR
INR: 1.16 (ref 0.00–1.49)
Prothrombin Time: 14.8 seconds (ref 11.6–15.2)

## 2014-09-19 LAB — APTT: aPTT: 31 seconds (ref 24–37)

## 2014-09-19 LAB — BASIC METABOLIC PANEL
Anion gap: 24 — ABNORMAL HIGH (ref 5–15)
BUN: 81 mg/dL — ABNORMAL HIGH (ref 6–23)
CHLORIDE: 92 meq/L — AB (ref 96–112)
CO2: 23 meq/L (ref 19–32)
Calcium: 8.2 mg/dL — ABNORMAL LOW (ref 8.4–10.5)
Creatinine, Ser: 17.57 mg/dL — ABNORMAL HIGH (ref 0.50–1.10)
GFR calc Af Amer: 2 mL/min — ABNORMAL LOW (ref >90)
GFR calc non Af Amer: 2 mL/min — ABNORMAL LOW (ref >90)
GLUCOSE: 87 mg/dL (ref 70–99)
Potassium: 5.2 mEq/L (ref 3.7–5.3)
SODIUM: 139 meq/L (ref 137–147)

## 2014-09-19 MED ORDER — FENTANYL CITRATE 0.05 MG/ML IJ SOLN
INTRAMUSCULAR | Status: AC
  Filled 2014-09-19: qty 2

## 2014-09-19 MED ORDER — SODIUM CHLORIDE 0.9 % IV SOLN: 100.0000 mL | INTRAVENOUS | Status: DC | PRN

## 2014-09-19 MED ORDER — MIDAZOLAM HCL 2 MG/2ML IJ SOLN
INTRAMUSCULAR | Status: AC
  Filled 2014-09-19: qty 2

## 2014-09-19 MED ORDER — SODIUM CHLORIDE 0.9 % IV SOLN: 250.0000 mL | INTRAVENOUS | Status: DC | PRN

## 2014-09-19 MED ORDER — ALTEPLASE 2 MG IJ SOLR
2.0000 mg | Freq: Once | INTRAMUSCULAR | Status: DC | PRN
  Filled 2014-09-19: qty 2

## 2014-09-19 MED ORDER — CALCIUM ACETATE 667 MG PO CAPS
2001.0000 mg | ORAL_CAPSULE | Freq: Three times a day (TID) | ORAL | Status: DC
  Administered 2014-09-20 – 2014-09-22 (×4): 2001 mg via ORAL
  Filled 2014-09-19 (×10): qty 3

## 2014-09-19 MED ORDER — MIDODRINE HCL 5 MG PO TABS
10.0000 mg | ORAL_TABLET | Freq: Three times a day (TID) | ORAL | Status: DC
  Administered 2014-09-19: 10 mg via ORAL

## 2014-09-19 MED ORDER — LIDOCAINE-PRILOCAINE 2.5-2.5 % EX CREA: 1.0000 "application " | TOPICAL_CREAM | CUTANEOUS | Status: DC | PRN

## 2014-09-19 MED ORDER — HYDROCODONE-ACETAMINOPHEN 5-325 MG PO TABS
1.0000 | ORAL_TABLET | ORAL | Status: DC | PRN
  Administered 2014-09-19 – 2014-09-20 (×2): 2 via ORAL
  Administered 2014-09-21: 1 via ORAL
  Administered 2014-09-22: 2 via ORAL
  Filled 2014-09-19: qty 1
  Filled 2014-09-19 (×3): qty 2

## 2014-09-19 MED ORDER — FLUDROCORTISONE ACETATE 0.1 MG PO TABS
0.2000 mg | ORAL_TABLET | Freq: Every day | ORAL | Status: DC
  Administered 2014-09-19 – 2014-09-22 (×4): 0.2 mg via ORAL
  Filled 2014-09-19 (×4): qty 2

## 2014-09-19 MED ORDER — PENTAFLUOROPROP-TETRAFLUOROETH EX AERO: 1.0000 "application " | INHALATION_SPRAY | CUTANEOUS | Status: DC | PRN

## 2014-09-19 MED ORDER — SODIUM CHLORIDE 0.9 % IV SOLN: INTRAVENOUS | Status: DC

## 2014-09-19 MED ORDER — CEFAZOLIN SODIUM-DEXTROSE 2-3 GM-% IV SOLR: 2.0000 g | INTRAVENOUS | Status: DC

## 2014-09-19 MED ORDER — MIDODRINE HCL 5 MG PO TABS
10.0000 mg | ORAL_TABLET | Freq: Three times a day (TID) | ORAL | Status: DC
  Administered 2014-09-20 – 2014-09-22 (×6): 10 mg via ORAL
  Filled 2014-09-19 (×10): qty 2

## 2014-09-19 MED ORDER — IOHEXOL 300 MG/ML  SOLN
50.0000 mL | Freq: Once | INTRAMUSCULAR | Status: AC | PRN
  Administered 2014-09-19: 40 mL via INTRAVENOUS

## 2014-09-19 MED ORDER — ALTEPLASE 2 MG IJ SOLR: 2.0000 mg | Freq: Once | INTRAMUSCULAR | Status: DC | PRN

## 2014-09-19 MED ORDER — POLYETHYLENE GLYCOL 3350 17 G PO PACK
17.0000 g | PACK | Freq: Every day | ORAL | Status: DC | PRN
  Filled 2014-09-19: qty 1

## 2014-09-19 MED ORDER — MIDODRINE HCL 5 MG PO TABS
ORAL_TABLET | ORAL | Status: AC
  Administered 2014-09-19: 10 mg via ORAL
  Filled 2014-09-19: qty 2

## 2014-09-19 MED ORDER — LIDOCAINE-PRILOCAINE 2.5-2.5 % EX CREA
1.0000 "application " | TOPICAL_CREAM | CUTANEOUS | Status: DC | PRN
  Filled 2014-09-19: qty 5

## 2014-09-19 MED ORDER — ACETAMINOPHEN 325 MG PO TABS: 650.0000 mg | ORAL_TABLET | Freq: Four times a day (QID) | ORAL | Status: DC | PRN

## 2014-09-19 MED ORDER — CINACALCET HCL 30 MG PO TABS
30.0000 mg | ORAL_TABLET | Freq: Two times a day (BID) | ORAL | Status: DC
  Administered 2014-09-20 – 2014-09-22 (×3): 30 mg via ORAL
  Filled 2014-09-19 (×7): qty 1

## 2014-09-19 MED ORDER — ONDANSETRON HCL 4 MG PO TABS: 4.0000 mg | ORAL_TABLET | Freq: Four times a day (QID) | ORAL | Status: DC | PRN

## 2014-09-19 MED ORDER — HEPARIN SODIUM (PORCINE) 1000 UNIT/ML DIALYSIS: 1000.0000 [IU] | INTRAMUSCULAR | Status: DC | PRN

## 2014-09-19 MED ORDER — LIDOCAINE HCL (PF) 1 % IJ SOLN: 5.0000 mL | INTRAMUSCULAR | Status: DC | PRN

## 2014-09-19 MED ORDER — ACETAMINOPHEN 650 MG RE SUPP: 650.0000 mg | Freq: Four times a day (QID) | RECTAL | Status: DC | PRN

## 2014-09-19 MED ORDER — CEFAZOLIN SODIUM-DEXTROSE 2-3 GM-% IV SOLR
INTRAVENOUS | Status: AC
  Filled 2014-09-19: qty 50

## 2014-09-19 MED ORDER — NEPRO/CARBSTEADY PO LIQD
237.0000 mL | ORAL | Status: DC | PRN
  Filled 2014-09-19: qty 237

## 2014-09-19 MED ORDER — NEPRO/CARBSTEADY PO LIQD: 237.0000 mL | ORAL | Status: DC | PRN

## 2014-09-19 MED ORDER — SODIUM CHLORIDE 0.9 % IJ SOLN: 3.0000 mL | INTRAMUSCULAR | Status: DC | PRN

## 2014-09-19 MED ORDER — ONDANSETRON HCL 4 MG/2ML IJ SOLN: 4.0000 mg | Freq: Four times a day (QID) | INTRAMUSCULAR | Status: DC | PRN

## 2014-09-19 MED ORDER — SODIUM CHLORIDE 0.9 % IJ SOLN
3.0000 mL | Freq: Two times a day (BID) | INTRAMUSCULAR | Status: DC
  Administered 2014-09-19 – 2014-09-22 (×6): 3 mL via INTRAVENOUS

## 2014-09-19 MED ORDER — SODIUM CHLORIDE 0.9 % IV SOLN: INTRAVENOUS | Status: AC | PRN

## 2014-09-19 NOTE — H&P (Signed)
Agree.  Will inspect site and perform catheter xrays and injection before deciding whether cath exchange necessary.

## 2014-09-19 NOTE — Sedation Documentation (Addendum)
MD at bedside.  Dr Melvia Heaps and Dr Barbie Banner in IR room ,discussing case and dispostion, pt moved onto stretcher.  Hematoma circled by Dr Barbie Banner.

## 2014-09-19 NOTE — Progress Notes (Signed)
Hemodialysis- No changes in hematoma during HD thus far, catheter is very positional, no further abdominal pain. Spoke with Dr. Jonnie Finner, order to cut time to 2.5 hours. Continue to monitor patient.

## 2014-09-19 NOTE — Sedation Documentation (Signed)
Hand injection of dye done by [facility] Rt.  Dr Barbie Banner reviewing.  Catheter is open and injects well per MD.  Cuff is exposed, redressed.  Dr Barbie Banner calling renal MD now.  Pt aware of evemts.

## 2014-09-19 NOTE — Progress Notes (Signed)
Hemodialysis- System clotted x1, unable to achieve bfr >300 during treatment d/t high arterial pressure. Catheter seems very positional. Pt unable to sit up at all while running, runs best when lying almost flat. No change in hematoma, Dr. Jonnie Finner updated. Hgb redraw=10.5. States some occasional abdominal discomfort, none in last 2 hours. No further complaints. Report called to 6E. UF=2.5L d/t clotting and frequent flushes to prevent clotting.

## 2014-09-19 NOTE — Sedation Documentation (Signed)
Pt placement called with bed, floor called but unable to take report yet, to call back.

## 2014-09-19 NOTE — H&P (Signed)
Renal Service History & Physical Judy Green  [name] is an 48 y.o. female.  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  She is on dialysis via R UA HeRO at [facility] on TTS schedule. She presented Sat with occluded HeRO access and on Sunday underwent R femoral vein tunneled HD cath placement at [facility].  She went to OP HD today for HD and they were unable to get the catheter to work , wouldn't flush or pull and wouldn't run.  Sent to hospital where IR did flush injection and imaging of catheter and it flushed well with no problems, and the tips are in the right position below the R atrium.  Main problem here was the patient has developed a painful hematoma of the R thigh above the catheter exit site.  We were asked to see the patient and decision has been made to admit for observation of the hematoma and to ensure that the catheter will work for HD before sending her home.    Pt no c/o's except for hematoma pain. No R foot pain or numbness. No CP, SOB, abd pain, n/v/d.   Chart Review: 6/03 - [facility]COli urosepsis, renal transplant with AKI, anemia, poss PNA, esrd d/t FSGS 8/07 - [facility] admitted and renal function much worse, had not been taking Tx meds due to $ constraints , acute on CKD baseline 3.5 Cr > HD cath placed, pt started on HD and then transferred to [facility] for biopsy of Tx 9/07 - abd pain d/t RP hematoma, Anemia, ESRD on HD, HTN. RP mass was biopsied and it showed only blood. Refused prbc's, Hb was in low 7's, EPO started. NO nsaid's with bleeding. WBC 26 > 9K, cx's negative.   6/09 - acute GIB with melena and hypotension BP 60's, hx of chronic low BP's with HD > admitted to ICU, cx's were negative, ischemic colitis suspected cause of bloody stools in hospital. Cortisol was low at 3.0, rx with stress dose steroids. Eventually arm BP's weren't trusted and leg BP's showed higher SBP in  130's.  9/13 - syncopal episode after HD, similar hx in the past > poss cause would be adrenal insuff, tele negative, midodrine ^'d to 10 mg TID, continued fludrocortisone and prednisone, tsh wnl  Past Medical History  Past Medical History  Diagnosis Date  . Chronic kidney disease     KIDNEY FAILURE  . ESRD (end stage renal disease)     Complications of transplanted kidney  . Rectal bleeding 2007    hemorrhoids/diverticular disease  . Retroperitoneal hematoma 9/07    transfusion  . H/O parathyroidectomy 1999  . UTI (lower urinary tract infection)   . Adrenal insufficiency 2009  . Sleep apnea     does not have cpap at this time  . Oral contraceptive use     patient states has not had menstral cycle in 1 year.  . Hypertension     Dr. Seward Carol [phone]  . Chronic headaches   . H/O syncope   . Breast cancer     RT  . Anemia     hx of  . Renal insufficiency    Past Surgical History  Past Surgical History  Procedure Laterality Date  . Av fistula placement Right 05/08/2007  . Hero avg Left 12/14/2008  . Hero  04/17/2009    Removed clotted Left Hero  2) New Right Hero placed  and Stent to SVC  . Breast lumpectomy Left   . Exploration renal s/p transplantation    . Tubal ligation    . Avgg removal  08/07/2012    Procedure: REMOVAL OF ARTERIOVENOUS GORETEX GRAFT ([place]);  Surgeon: Rosetta Posner, MD;  Location: [facility] OR;  Service: Vascular;  Laterality: Right;   Family History  Family History  Problem Relation Age of Onset  . Diabetes Mother   . Diabetes Maternal Aunt     x 3  . Colon cancer Maternal Uncle   . Heart disease Maternal Grandmother   . Diabetes Maternal Grandmother   . Stroke Maternal Grandmother   . Hypertension Brother   . Hypertension Brother   . Hypertension Brother   . Prostate cancer Maternal Uncle    Social History  reports that she quit smoking about 11 years ago. She has never used smokeless tobacco. She reports that she drinks alcohol. She reports  that she does not use illicit drugs. Allergies No Known Allergies Home medications Prior to Admission medications   Medication Sig Start Date End Date Taking? Authorizing Provider  aspirin EC 325 MG tablet Take 325 mg by mouth daily.    Historical Provider, MD  Calcium Acetate 667 MG TABS Take 1,334 mg by mouth 3 (three) times daily with meals.     Historical Provider, MD  clopidogrel (PLAVIX) 75 MG tablet Take 75 mg by mouth daily.      Historical Provider, MD  fludrocortisone (FLORINEF) 0.1 MG tablet TAKE 2 TABLETS BY MOUTH DAILY    Renato Shin, MD  gabapentin (NEURONTIN) 100 MG capsule  11/18/13   Historical Provider, MD  JOLIVETTE 0.35 MG tablet TAKE ONE TABLET BY MOUTH DAILY    Lahoma Crocker, MD  JOLIVETTE 0.35 MG tablet TAKE 1 TABLET BY MOUTH EVERY DAY 08/29/14   Lahoma Crocker, MD  midodrine (PROAMATINE) 10 MG tablet Take 1 tablet (10 mg total) by mouth 3 (three) times daily. 09/06/12   Theodis Blaze, MD  SENSIPAR 30 MG tablet Take 30 mg by mouth 2 (two) times daily.  07/08/12   Historical Provider, MD   Liver Function Tests No results found for this basename: AST, ALT, ALKPHOS, BILITOT, PROT, ALBUMIN,  in the last 168 hours No results found for this basename: LIPASE, AMYLASE,  in the last 168 hours CBC  Recent Labs Lab 09/19/14 1554  WBC 7.1  HGB 10.4*  HCT 31.0*  MCV 90.6  PLT 867   Basic Metabolic Panel  Recent Labs Lab 09/19/14 1554  NA 139  K 5.2  CL 92*  CO2 23  GLUCOSE 87  BUN 81*  CREATININE 17.57*  CALCIUM 8.2*    Filed Vitals:   09/19/14 1415  BP: 102/77  Pulse: 109  Temp: 98.2 F (36.8 C)  Resp: 20  SpO2: 98%   Exam: Alert, no distress, calm No rash, cyanosis or gangrene Sclera anicteric, throat clear No jvd Chest clear bilat, no rales or wheezing RRR no MRG R arm clotted HeRO grafts x 2 in upper arm no bruit or thrill Abd soft, obese, NTND R upper thigh hematoma grapefruit sized in diameter, not actively changing, very tender. TDC  (new cath with intended in/out cuff) intact, no exit site bleeding R leg and foot well perfused, R dorsal pedis 2+, post tibial not detected L leg no sig abnormalities Neuro is nf, Ox 3  HD: [facility] [place] [facility]   450/1.5   109kg   2/2.0 [place]   P4  New R thigh AVG (10/4 by [facility] vascular), occluded RUA HeRO access  Heparin 3000 bolus + 3000 midRx prn Hectorol 1 ug TIW tsat 23%  pth 144  P 6.1  Hb 12  Assessment: 1 Malfunction HD access- flushing fine in IR today 2 R groin hematoma - no signs of vascular compromise and hematoma is stable. Doubt arterial injury given stable clinical status, lack of ischemic changes in foot Have d/w Dr Barbie Banner in IR and Dr Augustin Coupe with [facility]. If worsens will call VVS or get CT angio to visualize the fem artery on that side 3 ESRD on HD 4 Hx failed renal Tx 5 Anemia Hb 12 as OP 6 HPTH on vit D, binders 7 Chronic hypotension - up 7 kg by wts, no resp compromise, on midodrine   Plan- HD tonight 3 hours , no hep, watch hematoma during HD and after, repeat Hb in am. HD again tomorrow to get to dry wt and for solute. Cont usual home meds except hold plavix.     Kelly Splinter MD (pgr) [phone]    ([phone] 09/19/2014, 6:07 PM

## 2014-09-19 NOTE — Sedation Documentation (Addendum)
Dr Melvia Heaps writing admitting orders.  Pt aware of admission and agreeable.

## 2014-09-19 NOTE — H&P (Signed)
Chief Complaint: Bleeding,swelling,pain at right femoral HD cath site  Referring Physician(s): Goldsborough,Kellie A  History of Present Illness: Judy Green is a 48 y.o. female with history of ESRD and recently clotted rt arm HERO graft placed originally at [facility]. Pt had rt femoral HD cath placed at outside facility on Sunday and now presents with poorly functioning cath,and  bleeding/swelling(hematoma) / pain at insertion site with exposed cuff. Request received for HD cath injection/probable exchange or placement of new catheter. Her last dose of plavix was on 10/4.  Past Medical History  Diagnosis Date  . Chronic kidney disease     KIDNEY FAILURE  . ESRD (end stage renal disease)     Complications of transplanted kidney  . Rectal bleeding 2007    hemorrhoids/diverticular disease  . Retroperitoneal hematoma 9/07    transfusion  . H/O parathyroidectomy 1999  . UTI (lower urinary tract infection)   . Adrenal insufficiency 2009  . Sleep apnea     does not have cpap at this time  . Oral contraceptive use     patient states has not had menstral cycle in 1 year.  . Hypertension     Dr. Seward Carol [phone]  . Chronic headaches   . H/O syncope   . Breast cancer     RT  . Anemia     hx of  . Renal insufficiency     Past Surgical History  Procedure Laterality Date  . Av fistula placement Right 05/08/2007  . Hero avg Left 12/14/2008  . Hero  04/17/2009    Removed clotted Left Hero  2) New Right Hero placed and Stent to SVC  . Breast lumpectomy Left   . Exploration renal s/p transplantation    . Tubal ligation    . Avgg removal  08/07/2012    Procedure: REMOVAL OF ARTERIOVENOUS GORETEX GRAFT ([place]);  Surgeon: Rosetta Posner, MD;  Location: [place];  Service: Vascular;  Laterality: Right;    Allergies: Review of patient's allergies indicates no known allergies.  Medications: Prior to Admission medications   Medication Sig Start Date End Date Taking?  Authorizing Provider  aspirin EC 325 MG tablet Take 325 mg by mouth daily.    Historical Provider, MD  Calcium Acetate 667 MG TABS Take 1,334 mg by mouth 3 (three) times daily with meals.     Historical Provider, MD  clopidogrel (PLAVIX) 75 MG tablet Take 75 mg by mouth daily.      Historical Provider, MD  fludrocortisone (FLORINEF) 0.1 MG tablet TAKE 2 TABLETS BY MOUTH DAILY    Renato Shin, MD  gabapentin (NEURONTIN) 100 MG capsule  11/18/13   Historical Provider, MD  JOLIVETTE 0.35 MG tablet TAKE ONE TABLET BY MOUTH DAILY    Lahoma Crocker, MD  JOLIVETTE 0.35 MG tablet TAKE 1 TABLET BY MOUTH EVERY DAY 08/29/14   Lahoma Crocker, MD  midodrine (PROAMATINE) 10 MG tablet Take 1 tablet (10 mg total) by mouth 3 (three) times daily. 09/06/12   Theodis Blaze, MD  SENSIPAR 30 MG tablet Take 30 mg by mouth 2 (two) times daily.  07/08/12   Historical Provider, MD    Family History  Problem Relation Age of Onset  . Diabetes Mother   . Diabetes Maternal Aunt     x 3  . Colon cancer Maternal Uncle   . Heart disease Maternal Grandmother   . Diabetes Maternal Grandmother   . Stroke Maternal Grandmother   . Hypertension Brother   .  Hypertension Brother   . Hypertension Brother   . Prostate cancer Maternal Uncle     History   Social History  . Marital Status: Married    Spouse Name: N/A    Number of Children: 2  . 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 Smoking status: Former Smoker    Quit date: 12/16/2002  . Smokeless tobacco: Never Used  . Alcohol Use: Yes     Comment: 2-3x/week  . Drug Use: No  . Sexual Activity: Not Currently   Other Topics Concern  . Not on file   Social History Narrative   Regular exercise-yes   Caffeine Use-yes               Review of Systems    Constitutional: Negative for fever and chills.  Respiratory: Negative for cough and shortness of breath.     Cardiovascular: Negative for chest pain.       Pain/swelling/bleeding form rt femoral HD cath site  Gastrointestinal: Negative for nausea, vomiting and blood in stool.       Occ abd pain  Genitourinary: Negative for hematuria.  Musculoskeletal: Negative for back pain.  Neurological: Negative for headaches.    Vital Signs: BP 102/77  Pulse 109  Temp(Src) 98.2 F (36.8 C)  Resp 20  SpO2 98%  Physical Exam  Constitutional: She is oriented to person, place, and time. She appears well-developed and well-nourished.  Cardiovascular: Normal rate and regular rhythm.   Rt fem HD cath with exposed cuff and small amount bleeding noted from insertion site ; moderate sized hematoma in region ,tender to palpation; intact distal pulses  Pulmonary/Chest: Effort normal and breath sounds normal.  Abdominal: Soft. Bowel sounds are normal. There is no tenderness.  obese  Musculoskeletal: Normal range of motion. She exhibits edema.  Neurological: She is alert and oriented to person, place, and time.    Imaging: Dg Abd 1 View  09/19/2014   CLINICAL DATA:  Status post placement of femoral dialysis catheter.  EXAM: ABDOMEN - 1 VIEW  COMPARISON:  Plain films the abdomen chest 12/14/2013.  FINDINGS: Right groin approach double lumen central venous catheter is in place. The tip of the distal lumen is at the inferior cavoatrial junction with proximal lumen 3.7 cm below the inferior cavoatrial junction. The bowel gas pattern is nonobstructive. Calcifications projecting over the right ilium are chronic and unchanged.  IMPRESSION: Distal lumen of right IJ approach central venous catheter projects at the inferior cavoatrial junction.   Electronically Signed   By: Inge Rise M.D.   On: 09/19/2014 14:53    Labs:  CBC:  Recent Labs  12/13/13 2147  WBC 6.5  HGB 12.3  HCT 37.4  PLT 191    COAGS: No results found for this basename: INR, APTT,  in the last 8760 hours  BMP:  Recent Labs   12/13/13 2147  NA 138  K 3.8  CL 94*  CO2 27  GLUCOSE 91  BUN 24*  CALCIUM 8.7  CREATININE 7.47*  GFRNONAA 6*  GFRAA 7*    LIVER FUNCTION TESTS:  Recent Labs  12/13/13 2147  BILITOT 0.4  AST 23  ALT 14  ALKPHOS 74  PROT 8.5*  ALBUMIN 4.0  09/19/14 labs pending  TUMOR MARKERS: No results found for this basename: AFPTM, CEA, CA199, CHROMGRNA,  in the last 8760 hours  Assessment and Plan: Judy Green is a  48 y.o. female with history of ESRD and recently clotted rt arm HERO graft placed originally at [facility]. Pt had rt femoral HD cath placed at outside facility on Sunday and now presents with poorly functioning catheter,and  bleeding/swelling(hematoma)/  pain at insertion site with exposed cuff. Request received for HD cath injection/probable exchange or placement of new catheter. Her last dose of plavix was on 10/4. Details/risks of procedure d/w pt with her understanding and consent.         I spent a total of 15 minutes face to face in clinical consultation, greater than 50% of which was counseling/coordinating care for right femoral HD cath evaluation.  Signed: Autumn Messing 09/19/2014, 3:23 PM

## 2014-09-20 ENCOUNTER — Inpatient Hospital Stay (HOSPITAL_COMMUNITY): Payer: Medicare Other

## 2014-09-20 ENCOUNTER — Encounter (HOSPITAL_COMMUNITY): Payer: Self-pay

## 2014-09-20 LAB — RENAL FUNCTION PANEL
Albumin: 3.4 g/dL — ABNORMAL LOW (ref 3.5–5.2)
Anion gap: 18 — ABNORMAL HIGH (ref 5–15)
BUN: 55 mg/dL — ABNORMAL HIGH (ref 6–23)
CO2: 23 meq/L (ref 19–32)
Calcium: 8.4 mg/dL (ref 8.4–10.5)
Chloride: 96 mEq/L (ref 96–112)
Creatinine, Ser: 13.74 mg/dL — ABNORMAL HIGH (ref 0.50–1.10)
GFR calc non Af Amer: 3 mL/min — ABNORMAL LOW (ref >90)
GFR, EST AFRICAN AMERICAN: 3 mL/min — AB (ref >90)
GLUCOSE: 93 mg/dL (ref 70–99)
POTASSIUM: 5.1 meq/L (ref 3.7–5.3)
Phosphorus: 5.6 mg/dL — ABNORMAL HIGH (ref 2.3–4.6)
SODIUM: 137 meq/L (ref 137–147)

## 2014-09-20 LAB — CBC
HCT: 30.7 % — ABNORMAL LOW (ref 36.0–46.0)
Hemoglobin: 10 g/dL — ABNORMAL LOW (ref 12.0–15.0)
MCH: 29.9 pg (ref 26.0–34.0)
MCHC: 32.6 g/dL (ref 30.0–36.0)
MCV: 91.9 fL (ref 78.0–100.0)
PLATELETS: 127 10*3/uL — AB (ref 150–400)
RBC: 3.34 MIL/uL — AB (ref 3.87–5.11)
RDW: 15 % (ref 11.5–15.5)
WBC: 5.7 10*3/uL (ref 4.0–10.5)

## 2014-09-20 LAB — GLUCOSE, CAPILLARY: GLUCOSE-CAPILLARY: 72 mg/dL (ref 70–99)

## 2014-09-20 MED ORDER — MIDODRINE HCL 5 MG PO TABS
ORAL_TABLET | ORAL | Status: AC
  Filled 2014-09-20: qty 2

## 2014-09-20 MED ORDER — CEFAZOLIN SODIUM-DEXTROSE 2-3 GM-% IV SOLR
2.0000 g | Freq: Once | INTRAVENOUS | Status: AC
  Administered 2014-09-21: 2 g via INTRAVENOUS
  Filled 2014-09-20: qty 50

## 2014-09-20 NOTE — Progress Notes (Signed)
Chief Complaint:  Bleeding,swelling,pain at right femoral HD cath site   Referring Physician(s):  Goldsborough,Kellie A   History of Present Illness:  Judy Green is a 48 y.o. female with history of ESRD and recently clotted rt arm HERO graft placed originally at [facility]. Pt had rt femoral HD cath placed at outside facility on Sunday and now presents with poorly functioning cath,and bleeding/swelling(hematoma) / pain at insertion site with exposed cuff. Request received for HD cath injection/probable exchange or placement of new catheter. Her last dose of plavix was on 10/4.  Catheter is just not functioning right. She was able to have 4hr HD treatment today, though did not have 'full weight' removal Nephrology requests removal of right femoral HD catheter and placement of new (L)HD catheter.  Past Medical History   Diagnosis  Date   .  Chronic kidney disease      KIDNEY FAILURE   .  ESRD (end stage renal disease)      Complications of transplanted kidney   .  Rectal bleeding  2007     hemorrhoids/diverticular disease   .  Retroperitoneal hematoma  9/07     transfusion   .  H/O parathyroidectomy  1999   .  UTI (lower urinary tract infection)    .  Adrenal insufficiency  2009   .  Sleep apnea      does not have cpap at this time   .  Oral contraceptive use      patient states has not had menstral cycle in 1 year.   .  Hypertension      Dr. Seward Carol [phone]   .  Chronic headaches    .  H/O syncope    .  Breast cancer      RT   .  Anemia      hx of   .  Renal insufficiency     Past Surgical History   Procedure  Laterality  Date   .  Av fistula placement  Right  05/08/2007   .  Hero avg  Left  12/14/2008   .  Hero   04/17/2009     Removed clotted Left Hero 2) New Right Hero placed and Stent to SVC   .  Breast lumpectomy  Left    .  Exploration renal s/p transplantation     .  Tubal ligation     .  Avgg removal   08/07/2012     Procedure: REMOVAL OF  ARTERIOVENOUS GORETEX GRAFT ([place]); Surgeon: Rosetta Posner, MD; Location: [place]; Service: Vascular; Laterality: Right;    Allergies:  Review of patient's allergies indicates no known allergies.  Medications:  Prior to Admission medications   Medication  Sig  Start Date  End Date  Taking?  Authorizing Provider   aspirin EC 325 MG tablet  Take 325 mg by mouth daily.     Historical Provider, MD   Calcium Acetate 667 MG TABS  Take 1,334 mg by mouth 3 (three) times daily with meals.     Historical Provider, MD   clopidogrel (PLAVIX) 75 MG tablet  Take 75 mg by mouth daily.     Historical Provider, MD   fludrocortisone (FLORINEF) 0.1 MG tablet  TAKE 2 TABLETS BY MOUTH DAILY     Renato Shin, MD   gabapentin (NEURONTIN) 100 MG capsule   11/18/13    Historical Provider, MD   JOLIVETTE 0.35 MG tablet  TAKE ONE TABLET BY MOUTH DAILY  Lahoma Crocker, MD   JOLIVETTE 0.35 MG tablet  TAKE 1 TABLET BY MOUTH EVERY DAY  08/29/14    Lahoma Crocker, MD   midodrine (PROAMATINE) 10 MG tablet  Take 1 tablet (10 mg total) by mouth 3 (three) times daily.  09/06/12    Theodis Blaze, MD   SENSIPAR 30 MG tablet  Take 30 mg by mouth 2 (two) times daily.  07/08/12    Historical Provider, MD    Family History   Problem  Relation  Age of Onset   .  Diabetes  Mother    .  Diabetes  Maternal Aunt      x 3   .  Colon cancer  Maternal Uncle    .  Heart disease  Maternal Grandmother    .  Diabetes  Maternal Grandmother    .  Stroke  Maternal Grandmother    .  Hypertension  Brother    .  Hypertension  Brother    .  Hypertension  Brother    .  Prostate cancer  Maternal Uncle     History    Social History   .  Marital Status:  Married     Spouse Name:  N/A     Number of Children:  2   .  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  Smoking status:  Former Smoker     Quit date:  12/16/2002   .  Smokeless tobacco:  Never Used     .  Alcohol Use:  Yes      Comment: 2-3x/week   .  Drug Use:  No   .  Sexual Activity:  Not Currently    Other Topics  Concern   .  Not on file    Social History Narrative    Regular exercise-yes    Caffeine Use-yes          Review of Systems  Constitutional: Negative for fever and chills.  Respiratory: Negative for cough and shortness of breath.  Cardiovascular: Negative for chest pain.  Pain/swelling/bleeding form rt femoral HD cath site  Gastrointestinal: Negative for nausea, vomiting and blood in stool.  Occ abd pain  Genitourinary: Negative for hematuria.  Musculoskeletal: Negative for back pain.  Neurological: Negative for headaches.   Vital Signs:  BP 108/62  Pulse 89  Temp(Src) 97.8 F (36.6 C) (Oral)  Resp 15  Ht 5\' 7"  (1.702 m)  Wt 246 lb 14.6 oz (112 kg)  BMI 38.66 kg/m2  SpO2 96%  Physical Exam  Constitutional: She is oriented to person, place, and time. She appears well-developed and well-nourished.  Cardiovascular: Normal rate and regular rhythm.  Rt fem HD cath with exposed cuff and small amount bleeding noted from insertion site ; moderate sized hematoma in region ,tender to palpation, but much better than yesterday; intact distal pulses  Pulmonary/Chest: Effort normal and breath sounds normal.  Abdominal: Soft. Bowel sounds are normal. There is no tenderness.  obese  Musculoskeletal: Normal range of motion. She exhibits edema.  Neurological: She is alert and oriented to person, place, and time.   Imaging:  Dg Abd 1 View  09/19/2014 CLINICAL DATA: Status post placement of femoral dialysis catheter. EXAM: ABDOMEN - 1 VIEW COMPARISON: Plain films the abdomen chest 12/14/2013. FINDINGS: Right groin approach double lumen central venous catheter is in place. The tip of  the distal lumen is at the inferior cavoatrial junction with proximal lumen 3.7 cm below the inferior cavoatrial junction. The bowel gas pattern is nonobstructive. Calcifications projecting  over the right ilium are chronic and unchanged. IMPRESSION: Distal lumen of right IJ approach central venous catheter projects at the inferior cavoatrial junction. Electronically Signed By: Inge Rise M.D. On: 09/19/2014 14:53  Labs:  CBC:  CBC    Component Value Date/Time   WBC 5.7 09/20/2014 0451   RBC 3.34* 09/20/2014 0451   HGB 10.0* 09/20/2014 0451   HCT 30.7* 09/20/2014 0451   PLT 127* 09/20/2014 0451   MCV 91.9 09/20/2014 0451   MCH 29.9 09/20/2014 0451   MCHC 32.6 09/20/2014 0451   RDW 15.0 09/20/2014 0451      COAGS:  PT/INR: 14.8/1.16  BMP:  BMET    Component Value Date/Time   NA 137 09/20/2014 0451   K 5.1 09/20/2014 0451   CL 96 09/20/2014 0451   CO2 23 09/20/2014 0451   GLUCOSE 93 09/20/2014 0451   BUN 55* 09/20/2014 0451   CREATININE 13.74* 09/20/2014 0451   CALCIUM 8.4 09/20/2014 0451   GFRNONAA 3* 09/20/2014 0451   GFRAA 3* 09/20/2014 0451     09/19/14 labs pending   TUMOR MARKERS:  No results found for this basename: AFPTM, CEA, CA199, CHROMGRNA, in the last 8760 hours   Assessment and Plan:  Shaterica L Juran is a 48 y.o. female with history of ESRD and recently clotted rt arm HERO graft placed originally at [facility]. Pt had rt femoral HD cath placed at outside facility on Sunday and now presents with poorly functioning catheter,and bleeding/swelling(hematoma)/ pain at insertion site with exposed cuff.  Discussed need to remove (R)fem catheter and place new tunneled (L)femoral HD catheter. Explained procedure, risks, complications, use of sedation. Consent signed in chart    Ascencion Dike PA-C Interventional Radiology 09/20/2014 2:00 PM

## 2014-09-20 NOTE — Progress Notes (Signed)
Subjective:  Seen on dialysis, no complaints New mild mild abd pain developed yest evening, persists today but not as bad, pain over R groin hematoma requiring pain meds but improved, no n/v. Hb down 10.5 > 10.0 today  Objective: Vital signs in last 24 hours: Temp:  [97.6 F (36.4 C)-98.5 F (36.9 C)] 97.8 F (36.6 C) (10/06 0700) Pulse Rate:  [69-109] 76 (10/06 0830) Resp:  [14-23] 15 (10/06 0830) BP: (85-131)/(48-78) 107/73 mmHg (10/06 0830) SpO2:  [95 %-98 %] 96 % (10/06 0558) Weight:  [115.8 kg (255 lb 4.7 oz)-118.5 kg (261 lb 3.9 oz)] 116 kg (255 lb 11.7 oz) (10/06 0700) Weight change:   Intake/Output from previous day: 10/05 0701 - 10/06 0700 In: -  Out: 2237  Intake/Output this shift:   Lab Results:  Recent Labs  09/19/14 1847 09/20/14 0451  WBC 7.5 5.7  HGB 10.5* 10.0*  HCT 32.0* 30.7*  PLT 147* 127*   BMET:  Recent Labs  09/19/14 1554 09/20/14 0451  NA 139 137  K 5.2 5.1  CL 92* 96  CO2 23 23  GLUCOSE 87 93  BUN 81* 55*  CREATININE 17.57* 13.74*  CALCIUM 8.2* 8.4  ALBUMIN  --  3.4*   No results found for this basename: PTH,  in the last 72 hours Iron Studies: No results found for this basename: IRON, TIBC, TRANSFERRIN, FERRITIN,  in the last 72 hours  Studies/Results: Dg Abd 1 View  09/19/2014   CLINICAL DATA:  Status post placement of femoral dialysis catheter.  EXAM: ABDOMEN - 1 VIEW  COMPARISON:  Plain films the abdomen chest 12/14/2013.  FINDINGS: Right groin approach double lumen central venous catheter is in place. The tip of the distal lumen is at the inferior cavoatrial junction with proximal lumen 3.7 cm below the inferior cavoatrial junction. The bowel gas pattern is nonobstructive. Calcifications projecting over the right ilium are chronic and unchanged.  IMPRESSION: Distal lumen of right IJ approach central venous catheter projects at the inferior cavoatrial junction.   Electronically Signed   By: Inge Rise M.D.   On: 09/19/2014 14:53    EXAM: General appearance: Alert, in no apparent distress Resp:  CTA without rales, rhonchi, or wheezes Cardio:  RRR without murmur or rub GI: + BS, soft and nontender  Extremities: R upper thigh hematoma stable, tender; no edema Access:  R femoral catheter w/ BFR 250 cc/miin, clotted RUA HeRO  [facility]  4h 450/1.5 109kg 2/2.0 Bath P4 New R thigh AVG (10/4 by [facility]), occluded RUA HeRO access Heparin 3000 bolus + 3000 midRx prn  Hectorol 1 ug TIW  tsat 23% pth 144 P 6.1 Hb 12  Assessment/Plan: 1. Malfunctioning dialysis access - R femoral catheter ran poorly last night (only 2.5 hrs), better today, but still w/ BFR 250 2. R groin hematoma - stable, but tender; no signs of vascular compromise. 3. ESRD - HD on TTS @ [place], K 5.1.  HD today. 4. Hypotension/Volume - BP 87/61 on Midodrine 10 mg tid; wt 116 kg with UF goal 4 L. 5. Anemia - Hgb 10, no Aranesp or Fe. 6. Sec HPT - Ca 8.4 (8.9 corrected), P 5.6; Hectorol 1 mcg, Sensipar 30 mg bid, Phoslo 3 with meals. 7. Nutrition - Alb 3.4, renal diet, multivitamin.   LOS: 1 day   LYLES,CHARLES 09/20/2014,9:03 AM  Pt seen, examined and agree w A/P as above. Catheter with poor flows today, will complete HD and get volume down as much  as possible. New abd pain may have RP bleed, will do CT after HD. Have d/w IR , positional flows may be due to tips not in R atrium, but they wouldn't putting another catheter on the R until the hematoma has resolved.  Prob will need new L thigh TDC.  Have d/w pt who and questions answered, agreeable to plan.  Kelly Splinter MD pager [phone]    cell [phone] 09/20/2014, 10:44 AM

## 2014-09-21 ENCOUNTER — Ambulatory Visit: Payer: Medicare Other | Admitting: Obstetrics & Gynecology

## 2014-09-21 ENCOUNTER — Inpatient Hospital Stay (HOSPITAL_COMMUNITY): Payer: Medicare Other

## 2014-09-21 LAB — RENAL FUNCTION PANEL
Albumin: 3.8 g/dL (ref 3.5–5.2)
Anion gap: 19 — ABNORMAL HIGH (ref 5–15)
BUN: 43 mg/dL — ABNORMAL HIGH (ref 6–23)
CO2: 23 mEq/L (ref 19–32)
Calcium: 8.8 mg/dL (ref 8.4–10.5)
Chloride: 96 mEq/L (ref 96–112)
Creatinine, Ser: 11.53 mg/dL — ABNORMAL HIGH (ref 0.50–1.10)
GFR calc non Af Amer: 3 mL/min — ABNORMAL LOW (ref >90)
GFR, EST AFRICAN AMERICAN: 4 mL/min — AB (ref >90)
GLUCOSE: 89 mg/dL (ref 70–99)
Phosphorus: 6.3 mg/dL — ABNORMAL HIGH (ref 2.3–4.6)
Potassium: 5.1 mEq/L (ref 3.7–5.3)
SODIUM: 138 meq/L (ref 137–147)

## 2014-09-21 LAB — CBC
HCT: 34 % — ABNORMAL LOW (ref 36.0–46.0)
Hemoglobin: 11 g/dL — ABNORMAL LOW (ref 12.0–15.0)
MCH: 30.7 pg (ref 26.0–34.0)
MCHC: 32.4 g/dL (ref 30.0–36.0)
MCV: 95 fL (ref 78.0–100.0)
PLATELETS: 137 10*3/uL — AB (ref 150–400)
RBC: 3.58 MIL/uL — ABNORMAL LOW (ref 3.87–5.11)
RDW: 15.2 % (ref 11.5–15.5)
WBC: 6.5 10*3/uL (ref 4.0–10.5)

## 2014-09-21 MED ORDER — LIDOCAINE HCL 1 % IJ SOLN
INTRAMUSCULAR | Status: AC
  Filled 2014-09-21: qty 20

## 2014-09-21 MED ORDER — HEPARIN SODIUM (PORCINE) 1000 UNIT/ML IJ SOLN
INTRAMUSCULAR | Status: AC
  Filled 2014-09-21: qty 1

## 2014-09-21 MED ORDER — MIDAZOLAM HCL 2 MG/2ML IJ SOLN
INTRAMUSCULAR | Status: AC
  Filled 2014-09-21: qty 4

## 2014-09-21 MED ORDER — SODIUM CHLORIDE 0.9 % IV SOLN
INTRAVENOUS | Status: AC | PRN
  Administered 2014-09-21: 30 mL/h via INTRAVENOUS

## 2014-09-21 MED ORDER — CEFAZOLIN SODIUM-DEXTROSE 2-3 GM-% IV SOLR
INTRAVENOUS | Status: AC
  Filled 2014-09-21: qty 50

## 2014-09-21 MED ORDER — FENTANYL CITRATE 0.05 MG/ML IJ SOLN
INTRAMUSCULAR | Status: AC | PRN
  Administered 2014-09-21 (×3): 25 ug via INTRAVENOUS

## 2014-09-21 MED ORDER — MIDAZOLAM HCL 2 MG/2ML IJ SOLN
INTRAMUSCULAR | Status: AC | PRN
  Administered 2014-09-21 (×4): 0.5 mg via INTRAVENOUS

## 2014-09-21 MED ORDER — CHLORHEXIDINE GLUCONATE 4 % EX LIQD
CUTANEOUS | Status: AC
  Filled 2014-09-21: qty 15

## 2014-09-21 MED ORDER — FENTANYL CITRATE 0.05 MG/ML IJ SOLN
INTRAMUSCULAR | Status: AC
  Filled 2014-09-21: qty 4

## 2014-09-21 NOTE — Sedation Documentation (Signed)
Beginning left femoral HD cath

## 2014-09-21 NOTE — Progress Notes (Signed)
Subjective:  NPO for catheter replacement this morning, no current complaints  Objective: Vital signs in last 24 hours: Temp:  [97.5 F (36.4 C)-99.5 F (37.5 C)] 98.3 F (36.8 C) (10/07 0500) Pulse Rate:  [76-94] 76 (10/07 0500) Resp:  [13-16] 16 (10/07 0500) BP: (82-121)/(49-75) 106/49 mmHg (10/07 0500) SpO2:  [95 %-100 %] 95 % (10/07 0500) Weight:  [112 kg (246 lb 14.6 oz)-116 kg (255 lb 11.7 oz)] 112 kg (246 lb 14.6 oz) (10/06 1140) Weight change: -6.5 kg (-14 lb 5.3 oz)  Intake/Output from previous day: 10/06 0701 - 10/07 0700 In: 123 [P.O.:120; I.V.:3] Out: 4150  Intake/Output this shift:   Lab Results:  Recent Labs  09/19/14 1847 09/20/14 0451  WBC 7.5 5.7  HGB 10.5* 10.0*  HCT 32.0* 30.7*  PLT 147* 127*   BMET:  Recent Labs  09/19/14 1554 09/20/14 0451  NA 139 137  K 5.2 5.1  CL 92* 96  CO2 23 23  GLUCOSE 87 93  BUN 81* 55*  CREATININE 17.57* 13.74*  CALCIUM 8.2* 8.4  ALBUMIN  --  3.4*   No results found for this basename: PTH,  in the last 72 hours Iron Studies: No results found for this basename: IRON, TIBC, TRANSFERRIN, FERRITIN,  in the last 72 hours  Studies/Results: Ct Abdomen Pelvis Wo Contrast  09/20/2014   CLINICAL DATA:  RIGHT groin hematoma and abdominal pain following hemodialysis catheter placement on 09/19/2014, assessment for retroperitoneal hemorrhage ; history of end-stage renal disease on dialysis, RIGHT breast cancer, hypertension  EXAM: CT ABDOMEN AND PELVIS WITHOUT CONTRAST  TECHNIQUE: Multidetector CT imaging of the abdomen and pelvis was performed following the standard protocol without IV contrast. Sagittal and coronal MPR images reconstructed from axial data set.  COMPARISON:  05/10/2008  FINDINGS: Minimal atelectasis at LEFT lower lobe.  Numerous collaterals in the anterior abdominal wall and inferior chest.  Tip of dialysis catheter via RIGHT femoral approach in IVC near in RIGHT atrial junction.  Atrophic kidneys with cystic  changes bilaterally.  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  Liver, spleen, pancreas, and adrenal glands unremarkable for technique.  Decompressed bladder.  Unremarkable uterus and adnexae.  Scattered colonic diverticulosis without evidence of diverticulitis.  Soft tissue swelling and infiltration at RIGHT inguinal region consistent with hematoma, unable to exclude infection by CT with this appearance.  No intrapelvic or retroperitoneal hemorrhage or abnormal fluid collection identified.  Stomach and bowel loops otherwise normal appearance.  No mass, adenopathy, or free fluid.  Small umbilical hernia containing fat.  No acute osseous findings.  IMPRESSION: No evidence of retroperitoneal or intrapelvic hemorrhage.  Soft tissue swelling and infiltration at RIGHT inguinal region could be related to hematoma but unable to exclude infection with this appearance.  Small umbilical hernia containing fat.   Electronically Signed   By: Lavonia Dana M.D.   On: 09/20/2014 14:50   EXAM:  General appearance: Alert, in no apparent distress  Resp: CTA without rales, rhonchi, or wheezes  Cardio: RRR without murmur or rub  GI: + BS, soft and nontender  Extremities: R upper thigh hematoma stable, tender; trace edema  Access: R femoral catheter, clotted RUA HeRO   [facility]  4h 450/1.5 109kg 2/2.0 Bath P4 New R thigh AVG (10/4 by [facility]), occluded RUA HeRO access Heparin 3000 bolus + 3000 midRx prn  Hectorol 1 ug TIW  tsat 23% pth 144 P 6.1 Hb 12  Assessment/Plan: 1. Malfunctioning dialysis access - clotted  RUA HeRO (placed @ [facility]), R femoral catheter (placed 10/4 @ [facility]) with poor flows during HD 10/5 & 6; removal with placement of L femoral catheter per IR this AM. 2. R groin hematoma - stable, but tender; no signs of vascular compromise, CT yesterday showed no evidence of retroperitoneal or intrapelvic hemorrhage.  3. ESRD - HD on TTS @ [place], K 5.1. HD tomorrow.   4. Hypotension/Volume - BP 106/49 on Midodrine 10 mg tid; wt 112 kg s/p net UF 4.2 L yesterday.  5. Anemia - Hgb 10, no Aranesp or Fe.  6. Sec HPT - Ca 8.4 (8.9 corrected), P 5.6; Hectorol 1 mcg, Sensipar 30 mg bid, Phoslo 3 with meals.  7. Nutrition - Alb 3.4, renal diet, multivitamin.    LOS: 2 days   LYLES,CHARLES 09/21/2014,6:59 AM  Pt seen, examined and agree w A/P as above. New L femoral TDC placed today by IR.  Plan HD today or in am, will d/w patient. DC home after we are sure the catheter is adequate for HD.  Kelly Splinter MD pager [phone]    cell [phone] 09/21/2014, 12:36 PM

## 2014-09-21 NOTE — Progress Notes (Signed)
Subjective:  NPO for catheter replacement this morning, no current complaints  Objective: Vital signs in last 24 hours: Temp:  [97.5 F (36.4 C)-99.5 F (37.5 C)] 97.8 F (36.6 C) (10/07 0912) Pulse Rate:  [76-107] 83 (10/07 1202) Resp:  [10-22] 15 (10/07 1202) BP: (88-116)/(31-70) 97/38 mmHg (10/07 1202) SpO2:  [95 %-100 %] 95 % (10/07 1202) Weight change: -6.5 kg (-14 lb 5.3 oz)  Intake/Output from previous day: 10/06 0701 - 10/07 0700 In: 123 [P.O.:120; I.V.:3] Out: 4150  Intake/Output this shift:   Lab Results:  Recent Labs  09/20/14 0451 09/21/14 0752  WBC 5.7 6.5  HGB 10.0* 11.0*  HCT 30.7* 34.0*  PLT 127* 137*   BMET:   Recent Labs  09/20/14 0451 09/21/14 0752  NA 137 138  K 5.1 5.1  CL 96 96  CO2 23 23  GLUCOSE 93 89  BUN 55* 43*  CREATININE 13.74* 11.53*  CALCIUM 8.4 8.8  ALBUMIN 3.4* 3.8   No results found for this basename: PTH,  in the last 72 hours Iron Studies: No results found for this basename: IRON, TIBC, TRANSFERRIN, FERRITIN,  in the last 72 hours  Studies/Results: Ct Abdomen Pelvis Wo Contrast  09/20/2014   CLINICAL DATA:  RIGHT groin hematoma and abdominal pain following hemodialysis catheter placement on 09/19/2014, assessment for retroperitoneal hemorrhage ; history of end-stage renal disease on dialysis, RIGHT breast cancer, hypertension  EXAM: CT ABDOMEN AND PELVIS WITHOUT CONTRAST  TECHNIQUE: Multidetector CT imaging of the abdomen and pelvis was performed following the standard protocol without IV contrast. Sagittal and coronal MPR images reconstructed from axial data set.  COMPARISON:  05/10/2008  FINDINGS: Minimal atelectasis at LEFT lower lobe.  Numerous collaterals in the anterior abdominal wall and inferior chest.  Tip of dialysis catheter via RIGHT femoral approach in IVC near in RIGHT atrial junction.  Atrophic kidneys with cystic changes bilaterally.  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  Liver, spleen,  pancreas, and adrenal glands unremarkable for technique.  Decompressed bladder.  Unremarkable uterus and adnexae.  Scattered colonic diverticulosis without evidence of diverticulitis.  Soft tissue swelling and infiltration at RIGHT inguinal region consistent with hematoma, unable to exclude infection by CT with this appearance.  No intrapelvic or retroperitoneal hemorrhage or abnormal fluid collection identified.  Stomach and bowel loops otherwise normal appearance.  No mass, adenopathy, or free fluid.  Small umbilical hernia containing fat.  No acute osseous findings.  IMPRESSION: No evidence of retroperitoneal or intrapelvic hemorrhage.  Soft tissue swelling and infiltration at RIGHT inguinal region could be related to hematoma but unable to exclude infection with this appearance.  Small umbilical hernia containing fat.   Electronically Signed   By: Lavonia Dana M.D.   On: 09/20/2014 14:50   EXAM:  Gen: Alert, in no apparent distress  Resp: clear bilat  Cardio: RRR without murmur or rub  GI: + BS, soft and nontender  Ext: no LE edema  Access: R groin HD cath out, new L fem TDC; RUA HeRO access no bruit Neuro: is nf, Ox 3, alert   [facility]: [facility]  4h 450/1.5 109kg 2/2.0 [place] P4 New R thigh AVG (10/4 by [facility]), occluded RUA HeRO access Heparin 3000 bolus + 3000 midRx prn Hectorol 1 ug TIW  tsat 23% pth 144 P 6.1 Hb 12  Assessment: 1. Malfunctioning dialysis access - clotted RUA HeRO (placed @ [facility]), R femoral catheter (placed 10/4 @ [facility]) with poor flows during HD 10/5 &  6; plan removal with placement of L femoral catheter per IR this AM. 2. R groin hematoma - stable, but tender; no signs of vascular compromise, CT yesterday showed no evidence of retroperitoneal or intrapelvic hemorrhage.  3. ESRD - HD on TTS @ [place], K 5.1. HD tomorrow.  4. Hypotension/Volume - BP 106/49 on Midodrine 10 mg tid; wt 112 kg s/p net UF 4.2 L yesterday.  5. Anemia - Hgb 10, no Aranesp or Fe.  6. Sec HPT - Ca 8.4  (8.9 corrected), P 5.6; Hectorol 1 mcg, Sensipar 30 mg bid, Phoslo 3 with meals.  7. Nutrition - Alb 3.4, renal diet, multivitamin.   Plan - plan HD in am with new L thigh HD catheter. If cath works well, will plan dc after HD tomorrow.   Kelly Splinter MD (pgr) [phone]    [phone] 09/21/2014, 3:55 PM

## 2014-09-21 NOTE — Procedures (Signed)
Procedure:  Removal of right femoral tunneled HD catheter; placement of new left femoral tunneled HD catheter.  Findings:  Right femoral cath removed in entirety. New left femoral 50 cm tip to cuff tunneled [place] catheter placed.  Distal lumen tip in lower RA.  OK to use.

## 2014-09-21 NOTE — Sedation Documentation (Signed)
Received to [facility] nurses station awaiting room readiness for cath exchange.

## 2014-09-21 NOTE — Sedation Documentation (Addendum)
Dr Kathlene Cote removing right femoral cath.  O2 2l/[place] started

## 2014-09-21 NOTE — Clinical Documentation Improvement (Signed)
.   Documentation of Anemia should include the type of anemia. Patient presents with malfunction of AV graft secondary to hematoma after right femoral HD cath placed at outside facility.   Patient's H&H = 10/30.7  Please clarify the type of anemia the patient has and document findings in next progress note and include in discharge summary.   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

## 2014-09-21 NOTE — Sedation Documentation (Signed)
O2 d/c'd 

## 2014-09-22 ENCOUNTER — Encounter (HOSPITAL_COMMUNITY): Payer: Self-pay | Admitting: Emergency Medicine

## 2014-09-22 ENCOUNTER — Observation Stay (HOSPITAL_COMMUNITY)
Admission: EM | Admit: 2014-09-22 | Discharge: 2014-09-23 | Disposition: A | Discharge status: Home / Self Care | Payer: Medicare Other | Attending: Internal Medicine | Admitting: Internal Medicine

## 2014-09-22 DIAGNOSIS — E872 Acidosis: Secondary | ICD-10-CM | POA: Insufficient documentation

## 2014-09-22 DIAGNOSIS — M62838 Other muscle spasm: Secondary | ICD-10-CM

## 2014-09-22 DIAGNOSIS — Z7952 Long term (current) use of systemic steroids: Secondary | ICD-10-CM | POA: Insufficient documentation

## 2014-09-22 DIAGNOSIS — Z940 Kidney transplant status: Secondary | ICD-10-CM | POA: Insufficient documentation

## 2014-09-22 DIAGNOSIS — N186 End stage renal disease: Secondary | ICD-10-CM | POA: Insufficient documentation

## 2014-09-22 DIAGNOSIS — Z8249 Family history of ischemic heart disease and other diseases of the circulatory system: Secondary | ICD-10-CM | POA: Insufficient documentation

## 2014-09-22 DIAGNOSIS — Z7982 Long term (current) use of aspirin: Secondary | ICD-10-CM | POA: Insufficient documentation

## 2014-09-22 DIAGNOSIS — Z793 Long term (current) use of hormonal contraceptives: Secondary | ICD-10-CM | POA: Insufficient documentation

## 2014-09-22 DIAGNOSIS — R252 Cramp and spasm: Secondary | ICD-10-CM | POA: Diagnosis not present

## 2014-09-22 DIAGNOSIS — G4730 Sleep apnea, unspecified: Secondary | ICD-10-CM | POA: Insufficient documentation

## 2014-09-22 DIAGNOSIS — Z992 Dependence on renal dialysis: Secondary | ICD-10-CM | POA: Insufficient documentation

## 2014-09-22 DIAGNOSIS — I9589 Other hypotension: Secondary | ICD-10-CM | POA: Insufficient documentation

## 2014-09-22 DIAGNOSIS — I120 Hypertensive chronic kidney disease with stage 5 chronic kidney disease or end stage renal disease: Secondary | ICD-10-CM | POA: Insufficient documentation

## 2014-09-22 DIAGNOSIS — Z7902 Long term (current) use of antithrombotics/antiplatelets: Secondary | ICD-10-CM | POA: Insufficient documentation

## 2014-09-22 DIAGNOSIS — E860 Dehydration: Secondary | ICD-10-CM | POA: Insufficient documentation

## 2014-09-22 DIAGNOSIS — D631 Anemia in chronic kidney disease: Secondary | ICD-10-CM | POA: Insufficient documentation

## 2014-09-22 DIAGNOSIS — Z853 Personal history of malignant neoplasm of breast: Secondary | ICD-10-CM | POA: Insufficient documentation

## 2014-09-22 DIAGNOSIS — S301XXA Contusion of abdominal wall, initial encounter: Secondary | ICD-10-CM

## 2014-09-22 DIAGNOSIS — Z79899 Other long term (current) drug therapy: Secondary | ICD-10-CM | POA: Insufficient documentation

## 2014-09-22 DIAGNOSIS — I959 Hypotension, unspecified: Secondary | ICD-10-CM

## 2014-09-22 DIAGNOSIS — Z87891 Personal history of nicotine dependence: Secondary | ICD-10-CM | POA: Insufficient documentation

## 2014-09-22 LAB — CBC WITH DIFFERENTIAL/PLATELET
Basophils Absolute: 0 10*3/uL (ref 0.0–0.1)
Basophils Relative: 0 % (ref 0–1)
Eosinophils Absolute: 0.3 10*3/uL (ref 0.0–0.7)
Eosinophils Relative: 4 % (ref 0–5)
HCT: 36.2 % (ref 36.0–46.0)
HEMOGLOBIN: 11.9 g/dL — AB (ref 12.0–15.0)
LYMPHS ABS: 1.2 10*3/uL (ref 0.7–4.0)
Lymphocytes Relative: 16 % (ref 12–46)
MCH: 30.1 pg (ref 26.0–34.0)
MCHC: 32.9 g/dL (ref 30.0–36.0)
MCV: 91.6 fL (ref 78.0–100.0)
MONOS PCT: 5 % (ref 3–12)
Monocytes Absolute: 0.4 10*3/uL (ref 0.1–1.0)
NEUTROS ABS: 5.4 10*3/uL (ref 1.7–7.7)
NEUTROS PCT: 75 % (ref 43–77)
Platelets: 121 10*3/uL — ABNORMAL LOW (ref 150–400)
RBC: 3.95 MIL/uL (ref 3.87–5.11)
RDW: 14.7 % (ref 11.5–15.5)
WBC: 7.3 10*3/uL (ref 4.0–10.5)

## 2014-09-22 LAB — RENAL FUNCTION PANEL
ANION GAP: 18 — AB (ref 5–15)
Albumin: 3.7 g/dL (ref 3.5–5.2)
BUN: 60 mg/dL — ABNORMAL HIGH (ref 6–23)
CALCIUM: 8.7 mg/dL (ref 8.4–10.5)
CO2: 23 mEq/L (ref 19–32)
Chloride: 95 mEq/L — ABNORMAL LOW (ref 96–112)
Creatinine, Ser: 14.75 mg/dL — ABNORMAL HIGH (ref 0.50–1.10)
GFR calc non Af Amer: 2 mL/min — ABNORMAL LOW (ref >90)
GFR, EST AFRICAN AMERICAN: 3 mL/min — AB (ref >90)
GLUCOSE: 88 mg/dL (ref 70–99)
POTASSIUM: 5.5 meq/L — AB (ref 3.7–5.3)
Phosphorus: 6.7 mg/dL — ABNORMAL HIGH (ref 2.3–4.6)
SODIUM: 136 meq/L — AB (ref 137–147)

## 2014-09-22 LAB — COMPREHENSIVE METABOLIC PANEL
ALBUMIN: 4.4 g/dL (ref 3.5–5.2)
ALK PHOS: 60 U/L (ref 39–117)
ALT: 9 U/L (ref 0–35)
ANION GAP: 23 — AB (ref 5–15)
AST: 34 U/L (ref 0–37)
BUN: 30 mg/dL — AB (ref 6–23)
CHLORIDE: 92 meq/L — AB (ref 96–112)
CO2: 23 mEq/L (ref 19–32)
Calcium: 9.5 mg/dL (ref 8.4–10.5)
Creatinine, Ser: 9.69 mg/dL — ABNORMAL HIGH (ref 0.50–1.10)
GFR calc non Af Amer: 4 mL/min — ABNORMAL LOW (ref >90)
GFR, EST AFRICAN AMERICAN: 5 mL/min — AB (ref >90)
GLUCOSE: 132 mg/dL — AB (ref 70–99)
POTASSIUM: 4.9 meq/L (ref 3.7–5.3)
Sodium: 138 mEq/L (ref 137–147)
Total Bilirubin: 0.5 mg/dL (ref 0.3–1.2)
Total Protein: 9.8 g/dL — ABNORMAL HIGH (ref 6.0–8.3)

## 2014-09-22 LAB — LIPASE, BLOOD: Lipase: 27 U/L (ref 11–59)

## 2014-09-22 LAB — MAGNESIUM: Magnesium: 2.3 mg/dL (ref 1.5–2.5)

## 2014-09-22 LAB — I-STAT CG4 LACTIC ACID, ED: Lactic Acid, Venous: 3.78 mmol/L — ABNORMAL HIGH (ref 0.5–2.2)

## 2014-09-22 MED ORDER — SODIUM CHLORIDE 0.9 % IV SOLN
250.0000 mL | INTRAVENOUS | Status: DC | PRN
Start: 2014-09-22 — End: 2014-09-23

## 2014-09-22 MED ORDER — SODIUM CHLORIDE 0.9 % IJ SOLN
3.0000 mL | Freq: Two times a day (BID) | INTRAMUSCULAR | Status: DC
  Administered 2014-09-23: 3 mL via INTRAVENOUS

## 2014-09-22 MED ORDER — HEPARIN SODIUM (PORCINE) 1000 UNIT/ML DIALYSIS: 1000.0000 [IU] | INTRAMUSCULAR | Status: DC | PRN

## 2014-09-22 MED ORDER — PENTAFLUOROPROP-TETRAFLUOROETH EX AERO: 1.0000 "application " | INHALATION_SPRAY | CUTANEOUS | Status: DC | PRN

## 2014-09-22 MED ORDER — MIDODRINE HCL 5 MG PO TABS
10.0000 mg | ORAL_TABLET | Freq: Three times a day (TID) | ORAL | Status: DC
  Administered 2014-09-23 (×2): 10 mg via ORAL
  Filled 2014-09-22 (×4): qty 2

## 2014-09-22 MED ORDER — ASPIRIN EC 325 MG PO TBEC
325.0000 mg | DELAYED_RELEASE_TABLET | Freq: Every day | ORAL | Status: DC
  Administered 2014-09-23: 325 mg via ORAL
  Filled 2014-09-22 (×2): qty 1

## 2014-09-22 MED ORDER — NEPRO/CARBSTEADY PO LIQD
237.0000 mL | ORAL | Status: DC | PRN
  Filled 2014-09-22: qty 237

## 2014-09-22 MED ORDER — CINACALCET HCL 30 MG PO TABS
30.0000 mg | ORAL_TABLET | Freq: Every day | ORAL | Status: DC
  Administered 2014-09-23: 30 mg via ORAL
  Filled 2014-09-22: qty 1

## 2014-09-22 MED ORDER — FENTANYL CITRATE 0.05 MG/ML IJ SOLN
50.0000 ug | Freq: Once | INTRAMUSCULAR | Status: AC
  Administered 2014-09-22: 50 ug via INTRAVENOUS
  Filled 2014-09-22: qty 2

## 2014-09-22 MED ORDER — LIDOCAINE-PRILOCAINE 2.5-2.5 % EX CREA
1.0000 "application " | TOPICAL_CREAM | CUTANEOUS | Status: DC | PRN
  Filled 2014-09-22: qty 5

## 2014-09-22 MED ORDER — CALCIUM ACETATE 667 MG PO TABS: 1334.0000 mg | ORAL_TABLET | Freq: Three times a day (TID) | ORAL | Status: DC

## 2014-09-22 MED ORDER — SODIUM CHLORIDE 0.9 % IJ SOLN: 3.0000 mL | INTRAMUSCULAR | Status: DC | PRN

## 2014-09-22 MED ORDER — FLUDROCORTISONE ACETATE 0.1 MG PO TABS: ORAL_TABLET | ORAL | Status: DC

## 2014-09-22 MED ORDER — LIDOCAINE HCL (PF) 1 % IJ SOLN: 5.0000 mL | INTRAMUSCULAR | Status: DC | PRN

## 2014-09-22 MED ORDER — SODIUM CHLORIDE 0.9 % IV SOLN: 100.0000 mL | INTRAVENOUS | Status: DC | PRN

## 2014-09-22 MED ORDER — OXYCODONE-ACETAMINOPHEN 5-325 MG PO TABS: 1.0000 | ORAL_TABLET | ORAL | Status: DC | PRN

## 2014-09-22 MED ORDER — OXYCODONE-ACETAMINOPHEN 5-325 MG PO TABS
2.0000 | ORAL_TABLET | Freq: Once | ORAL | Status: DC
Start: 2014-09-22 — End: 2014-09-22

## 2014-09-22 MED ORDER — FLUDROCORTISONE ACETATE 0.1 MG PO TABS
0.2000 mg | ORAL_TABLET | Freq: Every day | ORAL | Status: DC
  Administered 2014-09-23: 0.2 mg via ORAL
  Filled 2014-09-22: qty 2

## 2014-09-22 MED ORDER — NORETHINDRONE 0.35 MG PO TABS: 1.0000 | ORAL_TABLET | Freq: Every day | ORAL | Status: DC

## 2014-09-22 MED ORDER — GABAPENTIN 100 MG PO CAPS
100.0000 mg | ORAL_CAPSULE | Freq: Every day | ORAL | Status: DC
  Administered 2014-09-23: 100 mg via ORAL
  Filled 2014-09-22 (×2): qty 1

## 2014-09-22 MED ORDER — CLOPIDOGREL BISULFATE 75 MG PO TABS
75.0000 mg | ORAL_TABLET | Freq: Every day | ORAL | Status: DC
  Administered 2014-09-23: 75 mg via ORAL
  Filled 2014-09-22: qty 1

## 2014-09-22 MED ORDER — SODIUM CHLORIDE 0.9 % IV BOLUS (SEPSIS)
250.0000 mL | Freq: Once | INTRAVENOUS | Status: AC
  Administered 2014-09-22: 250 mL via INTRAVENOUS

## 2014-09-22 MED ORDER — HEPARIN SODIUM (PORCINE) 5000 UNIT/ML IJ SOLN
5000.0000 [IU] | Freq: Three times a day (TID) | INTRAMUSCULAR | Status: DC
Start: 2014-09-23 — End: 2014-09-23
  Administered 2014-09-23: 5000 [IU] via SUBCUTANEOUS
  Filled 2014-09-22 (×4): qty 1

## 2014-09-22 MED ORDER — ALTEPLASE 2 MG IJ SOLR
2.0000 mg | Freq: Once | INTRAMUSCULAR | Status: DC | PRN
  Filled 2014-09-22: qty 2

## 2014-09-22 MED ORDER — SODIUM CHLORIDE 0.9 % IJ SOLN
3.0000 mL | Freq: Two times a day (BID) | INTRAMUSCULAR | Status: DC
  Administered 2014-09-23 (×2): 3 mL via INTRAVENOUS

## 2014-09-22 NOTE — ED Notes (Signed)
PA informed of pt's request for pain medication.  

## 2014-09-22 NOTE — ED Notes (Addendum)
Pt. reports cramping at abdomen , chest , back and arms onset this evening at home , pt. was discharged here today due to occluded HD catheter. Hypotensive at triage.

## 2014-09-22 NOTE — ED Notes (Signed)
Kelly, PA at bedside. 

## 2014-09-22 NOTE — H&P (Addendum)
Hospitalist Admission History and Physical  Patient name: Judy Green Medical record number: [id number] Date of birth: Nov 24, 1966 Age: 48 y.o. Gender: female  Primary Care Provider: Kandice Hams, MD  Chief Complaint: muscle cramps  History of Present Illness:This is a 48 y.o. year old female with significant past medical history of ESRD on HD TTS, chronic hypotension, hx/o vascular access issues presenting with muscle cramps. Pt was discharged from the renal service earlier today for HD catheter malfunction. Pt had R groin cath removed and new L groin catheter placed. Pt was dialyzed today and then discharged home. Pt states that she normally has mild cramping assd with HD. However, within 20 minutes of returning home, she developed severe muscle cramps that initially started in her upper extremities, but then moved across her back and the rest of her body. Pt states that she normally recieves IV saline during HD for sxs.  Pt presented to the ER because of worsening cramps. Presented to ER T 98.7, HR 10s-20s, BP 70s-100s, Satting >92% on RA. Labs including hgb, plts, Cr, glu stable from discharge earlier today. Pt was given 250 cc bolus in ER. Pt reports marked improvement in cramps after this. Pt, however feels uncomfortable going home as she will be home alone tomorrow.   Assessment and Plan: [name] is a 48 y.o. year old female presenting with muscle cramps   Active Problems:   Muscle cramps   1- Muscle Cramps -marked improved s/p gentle hydration in the ER -will observe overnight -check mag level-dose per renal as clinically indicated   2-ESRD -s/p HD today  -at/near dry weight  -follow -renal consult prn   3-Adrenal insufficiency/Hypotension -BPs uptrending near baseline -cont home regimen  4-Anemia of renal disease  -hgb at baseline -noted recent R groin hematoma -pt does not report any bleeding currently  -d/c's on ASA and plavix for presumed  vascular access/clotting issues -cont to follow  FEN/GI: renal diet  Prophylaxis: sub q heparin  Disposition: pending further evaluation  Code Status:Full Code    Patient Active Problem List   Diagnosis Date Noted  . Groin hematoma 09/22/2014  . Muscle cramps 09/22/2014  . History of multiple failed HD accesses 09/19/2014  . Breast cancer   . Anemia   . ESRD on hemodialysis 07/22/2012  . Chronic hypotension 07/22/2012   Past Medical History: Past Medical History  Diagnosis Date  . 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 Went back on HD Aug 2007 and remains on HD at [facility] on TTS schedule. Just got back on Tx list as of fall 2015.   Marland Kitchen Rectal bleeding 2007    hemorrhoids/diverticular disease  . Retroperitoneal hematoma 9/07    transfusion  . H/O parathyroidectomy 1999  . UTI (lower urinary tract infection)   . Adrenal insufficiency 2009  . Sleep apnea     does not have cpap at this time  . Oral contraceptive use     patient states has not had menstral cycle in 1 year.  . Hypertension     Dr. Seward Carol [phone]  . Chronic headaches   . H/O syncope   . Breast cancer     RT  . Anemia     hx of  . History of multiple failed HD accesses 09/19/2014    Has had multiple failed HD accesses, including but not limited to LUA AVF, RUA AVG,  two LUA HeRO accesses, L thigh AVG, R thigh AVG and two RUA HeRO accesses.  As of fall 2015 her 2nd RUA HeRO occluded and attempt to salvage is pending. Access work is mainly done in [place] with [facility] also involved.    . Chronic hypotension 07/22/2012    Past Surgical History: Past Surgical History  Procedure Laterality Date  . Av fistula placement Right 05/08/2007  . Hero avg Left 12/14/2008  . Hero  04/17/2009    Removed clotted Left Hero  2) New Right Hero placed and Stent to SVC  . Breast lumpectomy Left   . Exploration renal s/p transplantation     . Tubal ligation    . Avgg removal  08/07/2012    Procedure: REMOVAL OF ARTERIOVENOUS GORETEX GRAFT ([place]);  Surgeon: Rosetta Posner, MD;  Location: [facility] OR;  Service: Vascular;  Laterality: Right;    Social History: History   Social History  . Marital Status: Married    Spouse Name: N/A    Number of Children: 2  . Years of Education: N/A   Occupational History  . customer service [employer] History Main Topics  . Smoking status: Former Smoker    Quit date: 12/16/2002  . Smokeless tobacco: Never Used  . Alcohol Use: Yes     Comment: 2-3x/week  . Drug Use: No  . Sexual Activity: Not Currently   Other Topics Concern  . None   Social History Narrative   Regular exercise-yes   Caffeine Use-yes          Family History: Family History  Problem Relation Age of Onset  . Diabetes Mother   . Diabetes Maternal Aunt     x 3  . Colon cancer Maternal Uncle   . Heart disease Maternal Grandmother   . Diabetes Maternal Grandmother   . Stroke Maternal Grandmother   . Hypertension Brother   . Hypertension Brother   . Hypertension Brother   . Prostate cancer Maternal Uncle     Allergies: No Known Allergies  Current Facility-Administered Medications  Medication Dose Route Frequency Provider Last Rate Last Dose  . 0.9 %  sodium chloride infusion  250 mL Intravenous PRN Shanda Howells, MD      . Derrill Memo ON 09/23/2014] aspirin EC tablet 325 mg  325 mg Oral Daily Shanda Howells, MD      . Derrill Memo ON 09/23/2014] Calcium Acetate TABS 1,334-2,001 mg  1,334-2,001 mg Oral TID WC Shanda Howells, MD      . Derrill Memo ON 09/23/2014] cinacalcet (SENSIPAR) tablet 30 mg  30 mg Oral Daily Shanda Howells, MD      . Derrill Memo ON 09/23/2014] clopidogrel (PLAVIX) tablet 75 mg  75 mg Oral Daily Shanda Howells, MD      . Derrill Memo ON 09/23/2014] fludrocortisone (FLORINEF) tablet 0.2 mg  0.2 mg Oral Daily Shanda Howells, MD      . Derrill Memo ON 09/23/2014] gabapentin (NEURONTIN) capsule 100 mg  100 mg Oral QHS  Shanda Howells, MD      . Derrill Memo ON 09/23/2014] heparin injection 5,000 Units  5,000 Units Subcutaneous 3 times per day Shanda Howells, MD      . Derrill Memo ON 09/23/2014] midodrine (PROAMATINE) tablet 10 mg  10 mg Oral TID Shanda Howells, MD      . Derrill Memo ON 09/23/2014] norethindrone (MICRONOR,CAMILA,ERRIN) 0.35 MG tablet 0.35 mg  1 tablet Oral Daily Shanda Howells, MD      . oxyCODONE-acetaminophen (PERCOCET/ROXICET) 5-325 MG per tablet 1-2 tablet  1-2 tablet Oral Q4H PRN Shanda Howells, MD      . Derrill Memo ON 09/23/2014] sodium chloride 0.9 % injection 3 mL  3 mL Intravenous Q12H Shanda Howells, MD      . Derrill Memo ON 09/23/2014] sodium chloride 0.9 % injection 3 mL  3 mL Intravenous Q12H Shanda Howells, MD      . sodium chloride 0.9 % injection 3 mL  3 mL Intravenous PRN Shanda Howells, MD       Current Outpatient Prescriptions  Medication Sig Dispense Refill  . aspirin EC 325 MG tablet Take 325 mg by mouth daily.      . Calcium Acetate 667 MG TABS Take 1,334-2,001 mg by mouth 3 (three) times daily with meals. Take 2001 mg by mouth 3 times daily with meals. Take 1334 mg by mouth twice daily with snacks or deserts.      . clopidogrel (PLAVIX) 75 MG tablet Take 75 mg by mouth daily.        . fludrocortisone (FLORINEF) 0.1 MG tablet Take 0.2 mg by mouth daily.      Marland Kitchen gabapentin (NEURONTIN) 100 MG capsule Take 100 mg by mouth at bedtime.       . midodrine (PROAMATINE) 10 MG tablet Take 1 tablet (10 mg total) by mouth 3 (three) times daily.  30 tablet  1  . norethindrone (MICRONOR,CAMILA,ERRIN) 0.35 MG tablet Take 1 tablet by mouth daily.      Marland Kitchen oxyCODONE-acetaminophen (PERCOCET/ROXICET) 5-325 MG per tablet Take 1-2 tablets by mouth every 4 (four) hours as needed for severe pain.      . pseudoephedrine-acetaminophen (TYLENOL SINUS) 30-500 MG TABS Take 2 tablets by mouth every 4 (four) hours as needed (for sinus relief).      . SENSIPAR 30 MG tablet Take 30 mg by mouth daily.        Review Of Systems: 12 point ROS  negative except as noted above in HPI.  Physical Exam: Filed Vitals:   09/22/14 2318  BP: 108/56  Pulse: 87  Temp: 98.7 F (37.1 C)  Resp: 19    General: alert and cooperative HEENT: PERRLA and extra ocular movement intact Heart: S1, S2 normal, no murmur, rub or gallop, regular rate and rhythm Lungs: clear to auscultation, no wheezes or rales and unlabored breathing Abdomen: abdomen is soft without significant tenderness, masses, organomegaly or guarding, L groin cather C/D/I Extremities: extremities normal, atraumatic, no cyanosis or edema Skin:no rashes, no ecchymoses Neurology: normal without focal findings  Labs and Imaging: Lab Results  Component Value Date/Time   NA 138 09/22/2014  8:55 PM   K 4.9 09/22/2014  8:55 PM   CL 92* 09/22/2014  8:55 PM   CO2 23 09/22/2014  8:55 PM   BUN 30* 09/22/2014  8:55 PM   CREATININE 9.69* 09/22/2014  8:55 PM   GLUCOSE 132* 09/22/2014  8:55 PM   Lab Results  Component Value Date   WBC 7.3 09/22/2014   HGB 11.9* 09/22/2014   HCT 36.2 09/22/2014   MCV 91.6 09/22/2014   PLT 121* 09/22/2014    Ir Fluoro Guide Cv Line Left  09/21/2014   CLINICAL DATA:  Malfunctioning tunneled hemodialysis catheter placed via right femoral vein with associated thigh hematoma. Request has been made to remove the right femoral catheter and place a new tunneled catheter on the left.  EXAM: 1. REMOVAL OF TUNNELED CENTRAL VENOUS HEMODIALYSIS CATHETER 2. TUNNELED CENTRAL VENOUS HEMODIALYSIS CATHETER PLACEMENT WITH ULTRASOUND AND FLUOROSCOPIC GUIDANCE  ANESTHESIA/SEDATION: 1.0 mg IV  Versed; 75 mcg IV Fentanyl.  Total Moderate Sedation Time  60 minutes.  MEDICATIONS: 2 g IV Ancef. As antibiotic prophylaxis, Ancef was ordered pre-procedure and administered intravenously within one hour of incision.  FLUOROSCOPY TIME:  30 seconds.  PROCEDURE: The procedure, risks, benefits, and alternatives were explained to the patient. Questions regarding the procedure were encouraged and  answered. The patient understands and consents to the procedure.  The a indwelling right femoral tunneled dialysis catheter and surrounding skin was prepped with chlorhexidine. The catheter was removed after infiltration of 1% lidocaine around the catheter exit site and use of made traction as well as blunt dissection.  The left thigh was prepped with chlorhexidine in a sterile fashion, and a sterile drape was applied covering the operative field. Maximum barrier sterile technique with sterile gowns and gloves were used for the procedure. Local anesthesia was provided with 1% lidocaine.  Ultrasound was used to confirm patency of the left common femoral vein. After creating a small venotomy incision, a 21 gauge needle was advanced into the left common femoral vein under direct, real-time ultrasound guidance. Ultrasound image documentation was performed. After securing guidewire access, an 8 Fr dilator was placed. A J-wire was kinked to measure appropriate catheter length.  An Ash Split tunneled hemodialysis catheter measuring 50 cm from tip to cuff was chosen for placement. This was tunneled in a retrograde fashion from the chest wall to the venotomy incision.  At the venotomy, serial dilatation was performed and a 14 Fr peel-away sheath was placed over a guidewire. The catheter was then placed through the sheath and the sheath removed. Final catheter positioning was confirmed and documented with a fluoroscopic spot image. The catheter was aspirated, flushed with saline, and injected with appropriate volume heparin dwells.  The venotomy incision was closed with subcutaneous 3-0 Monocryl and subcuticular 4-0 Vicryl. Dermabond was applied to the incision. The catheter exit site was secured with 0-Prolene retention sutures.  COMPLICATIONS: None.  No pneumothorax.  FINDINGS: After catheter placement, the tips lie in the right atrium. The catheter aspirates normally and is ready for immediate use.  IMPRESSION: 1. Removal  of right femoral tunneled hemodialysis catheter. 2. Placement of new tunneled hemodialysis catheter via the left common femoral vein. The catheter tips lie in the right atrium. The catheter is ready for immediate use.   Electronically Signed   By: Aletta Edouard M.D.   On: 09/21/2014 19:05   Ir Removal Tun Cv Cath W/o Fl  09/21/2014   CLINICAL DATA:  Malfunctioning tunneled hemodialysis catheter placed via right femoral vein with associated thigh hematoma. Request has been made to remove the right femoral catheter and place a new tunneled catheter on the left.  EXAM: 1. REMOVAL OF TUNNELED CENTRAL VENOUS HEMODIALYSIS CATHETER 2. TUNNELED CENTRAL VENOUS HEMODIALYSIS CATHETER PLACEMENT WITH ULTRASOUND AND FLUOROSCOPIC GUIDANCE  ANESTHESIA/SEDATION: 1.0 mg IV Versed; 75 mcg IV Fentanyl.  Total Moderate Sedation Time  60 minutes.  MEDICATIONS: 2 g IV Ancef. As antibiotic prophylaxis, Ancef was ordered pre-procedure and administered intravenously within one hour of incision.  FLUOROSCOPY TIME:  30 seconds.  PROCEDURE: The procedure, risks, benefits, and alternatives were explained to the patient. Questions regarding the procedure were encouraged and answered. The patient understands and consents to the procedure.  The a indwelling right femoral tunneled dialysis catheter and surrounding skin was prepped with chlorhexidine. The catheter was removed after infiltration of 1% lidocaine around the catheter exit site and use of made traction as well as blunt dissection.  The left thigh was prepped with chlorhexidine in a sterile fashion, and a sterile drape was applied covering the operative field. Maximum barrier sterile technique with sterile gowns and gloves were used for the procedure. Local anesthesia was provided with 1% lidocaine.  Ultrasound was used to confirm patency of the left common femoral vein. After creating a small venotomy incision, a 21 gauge needle was advanced into the left common femoral vein under  direct, real-time ultrasound guidance. Ultrasound image documentation was performed. After securing guidewire access, an 8 Fr dilator was placed. A J-wire was kinked to measure appropriate catheter length.  An Ash Split tunneled hemodialysis catheter measuring 50 cm from tip to cuff was chosen for placement. This was tunneled in a retrograde fashion from the chest wall to the venotomy incision.  At the venotomy, serial dilatation was performed and a 14 Fr peel-away sheath was placed over a guidewire. The catheter was then placed through the sheath and the sheath removed. Final catheter positioning was confirmed and documented with a fluoroscopic spot image. The catheter was aspirated, flushed with saline, and injected with appropriate volume heparin dwells.  The venotomy incision was closed with subcutaneous 3-0 Monocryl and subcuticular 4-0 Vicryl. Dermabond was applied to the incision. The catheter exit site was secured with 0-Prolene retention sutures.  COMPLICATIONS: None.  No pneumothorax.  FINDINGS: After catheter placement, the tips lie in the right atrium. The catheter aspirates normally and is ready for immediate use.  IMPRESSION: 1. Removal of right femoral tunneled hemodialysis catheter. 2. Placement of new tunneled hemodialysis catheter via the left common femoral vein. The catheter tips lie in the right atrium. The catheter is ready for immediate use.   Electronically Signed   By: Aletta Edouard M.D.   On: 09/21/2014 19:05   Ir US Guide Vasc Access Left  09/21/2014   CLINICAL DATA:  Malfunctioning tunneled hemodialysis catheter placed via right femoral vein with associated thigh hematoma. Request has been made to remove the right femoral catheter and place a new tunneled catheter on the left.  EXAM: 1. REMOVAL OF TUNNELED CENTRAL VENOUS HEMODIALYSIS CATHETER 2. TUNNELED CENTRAL VENOUS HEMODIALYSIS CATHETER PLACEMENT WITH ULTRASOUND AND FLUOROSCOPIC GUIDANCE  ANESTHESIA/SEDATION: 1.0 mg IV Versed; 75  mcg IV Fentanyl.  Total Moderate Sedation Time  60 minutes.  MEDICATIONS: 2 g IV Ancef. As antibiotic prophylaxis, Ancef was ordered pre-procedure and administered intravenously within one hour of incision.  FLUOROSCOPY TIME:  30 seconds.  PROCEDURE: The procedure, risks, benefits, and alternatives were explained to the patient. Questions regarding the procedure were encouraged and answered. The patient understands and consents to the procedure.  The a indwelling right femoral tunneled dialysis catheter and surrounding skin was prepped with chlorhexidine. The catheter was removed after infiltration of 1% lidocaine around the catheter exit site and use of made traction as well as blunt dissection.  The left thigh was prepped with chlorhexidine in a sterile fashion, and a sterile drape was applied covering the operative field. Maximum barrier sterile technique with sterile gowns and gloves were used for the procedure. Local anesthesia was provided with 1% lidocaine.  Ultrasound was used to confirm patency of the left common femoral vein. After creating a small venotomy incision, a 21 gauge needle was advanced into the left common femoral vein under direct, real-time ultrasound guidance. Ultrasound image documentation was performed. After securing guidewire access, an 8 Fr dilator was placed. A J-wire was kinked to measure appropriate catheter length.  An Ash Split tunneled hemodialysis catheter measuring  50 cm from tip to cuff was chosen for placement. This was tunneled in a retrograde fashion from the chest wall to the venotomy incision.  At the venotomy, serial dilatation was performed and a 14 Fr peel-away sheath was placed over a guidewire. The catheter was then placed through the sheath and the sheath removed. Final catheter positioning was confirmed and documented with a fluoroscopic spot image. The catheter was aspirated, flushed with saline, and injected with appropriate volume heparin dwells.  The venotomy  incision was closed with subcutaneous 3-0 Monocryl and subcuticular 4-0 Vicryl. Dermabond was applied to the incision. The catheter exit site was secured with 0-Prolene retention sutures.  COMPLICATIONS: None.  No pneumothorax.  FINDINGS: After catheter placement, the tips lie in the right atrium. The catheter aspirates normally and is ready for immediate use.  IMPRESSION: 1. Removal of right femoral tunneled hemodialysis catheter. 2. Placement of new tunneled hemodialysis catheter via the left common femoral vein. The catheter tips lie in the right atrium. The catheter is ready for immediate use.   Electronically Signed   By: Aletta Edouard M.D.   On: 09/21/2014 19:05           Shanda Howells MD  Pager: [phone]

## 2014-09-22 NOTE — ED Notes (Addendum)
Pt reports she was last dialyzed this morning for only about 3.5 hours. She states they stopped early because she was in pain. HD T/Th/Sat. HD catheter placed yesterday to left upper leg.

## 2014-09-22 NOTE — Procedures (Signed)
I was present at this dialysis session, have reviewed the session itself and made  appropriate changes  Kelly Splinter MD (pgr) [phone]    [phone] 09/22/2014, 3:41 PM

## 2014-09-22 NOTE — Discharge Summary (Addendum)
Physician Discharge Summary  Patient ID: LYNDELL GILLYARD MRN: [id number] DOB/AGE: Feb 26, 1966 48 y.o.  Admit date: 09/19/2014 Discharge date: 09/22/2014  Admission Diagnoses: 1 Hemodialysis catheter malfunction 2 ESRD on HD 3 Hist of failed renal transplant 4 Chronic hypotension  Discharge Diagnoses:  1 Hemodialysis catheter malfunction 2 ESRD on HD 3 Hist of failed renal transplant 4 Chronic hypotension 5 Right groin hematoma 6 Chronic anemia d/t CKD 7 Occluded R arm HeRO access   Discharged Condition: good  Hospital Course: 1 Hemodialysis catheter malfunction - patient presented with clotted RUA HeRO access , clotted on or around 10/3.  She underwent placement of R groin tunneled HD cath on Sunday 10/4 at [facility].  She went to HD on 10/5 outpt and the catheter wouldn't run. She was sent to IR at [facility] and they investigated and found the cath tips below the right atrium and the catheter flushed and pulled well. She had however a fairly large R groin swelling above the cath insertion site and R groin pain. Decision was made to admit the pt. She was admitted and had HD Mon night and again Tuesday. B/Cr were high prior to this.  The catheter functioned but poorly in the bed with BFR of 200-250 max.  IR was consulted again and on 10/7 removed the R groin cath and placed a new L groin TDC.  Patient did HD in HD unit on 10/8 with marginal BFR of 200 mL/hr.  No heparin was used due to R groin hematoma.    2 ESRD on HD - had HD x 3 while here, close to dry wt at d/c.   3 Hist of failed renal transplant 4 Chronic hypotension - on midodrine 5 Right groin hematoma - presumed sec to trauma from insertion vs inflamm reaction to SQ disc which is a feature of the particular catheter that was placed on Sunday.  CT showed no RP bleed.  Swelling decreased daily and at d/c there is minimal swelling and tenderness.  6 Clotted RUA HeRO access- pt has multiple failed perm  accesses and is f/b [facility] Dr. Kellie Simmering. I made several calls to try and speak with Dr Kellie Simmering, was able to speak only to the on-call vascular surgeon and was not able to reach Dr Evelena Leyden office to set up an appt. Patient is to call her vasc surgeon's office in the morning to get an appt, to see if they are going to try to salvage the HeRO graft or have some other idea in mind for perm access 7 Chronic anemia of CKD - no need for Rx   Consults: None  Treatments: dialysis: Hemodialysis  Discharge Exam: Blood pressure 98/47, pulse 96, temperature 98 F (36.7 C), temperature source Oral, resp. rate 18, height 5\' 7"  (1.702 m), weight 113.5 kg (250 lb 3.6 oz), SpO2 96.00%. Gen: Alert, in no apparent distress  Resp: clear bilat  Cardio: RRR without murmur or rub  GI: + BS, soft and nontender  Ext: no LE edema  Access: R groin HD cath out, new L fem TDC; RUA HeRO access no bruit  Neuro: is nf, Ox 3, alert   Disposition: 01-Home or Self Care     Medication List         aspirin EC 325 MG tablet  Take 325 mg by mouth daily.     Calcium Acetate 667 MG Tabs  Take 1,334-2,001 mg by mouth 3 (three) times daily with meals. Take 2001 mg by mouth  3 times daily with meals. Take 1334 mg by mouth twice daily with snacks or deserts.     clopidogrel 75 MG tablet  Commonly known as:  PLAVIX  Take 75 mg by mouth daily.     fludrocortisone 0.1 MG tablet  Commonly known as:  FLORINEF  Take 0.2 mg by mouth daily.     gabapentin 100 MG capsule  Commonly known as:  NEURONTIN  Take 100 mg by mouth at bedtime.     midodrine 10 MG tablet  Commonly known as:  PROAMATINE  Take 1 tablet (10 mg total) by mouth 3 (three) times daily.     norethindrone 0.35 MG tablet  Commonly known as:  MICRONOR,CAMILA,ERRIN  Take 1 tablet by mouth daily.     oxyCODONE-acetaminophen 5-325 MG per tablet  Commonly known as:  PERCOCET/ROXICET  Take 1-2 tablets by mouth every 4 (four) hours as needed for severe pain.      pseudoephedrine-acetaminophen 30-500 MG Tabs  Commonly known as:  TYLENOL SINUS  Take 2 tablets by mouth every 4 (four) hours as needed (for sinus relief).     SENSIPAR 30 MG tablet  Generic drug:  cinacalcet  Take 30 mg by mouth daily.         SignedRoney Jaffe D 09/22/2014, 4:11 PM

## 2014-09-22 NOTE — ED Notes (Signed)
MD at bedside. 

## 2014-09-22 NOTE — Progress Notes (Signed)
Subjective:  No complaints s/p left femoral catheter placement yesterday, breathing well  Objective: Vital signs in last 24 hours: Temp:  [97.8 F (36.6 C)-98.6 F (37 C)] 98.5 F (36.9 C) (10/08 0500) Pulse Rate:  [77-107] 79 (10/08 0500) Resp:  [10-22] 18 (10/08 0500) BP: (88-116)/(31-70) 92/49 mmHg (10/08 0500) SpO2:  [95 %-100 %] 95 % (10/08 0500) Weight:  [113.1 kg (249 lb 5.4 oz)] 113.1 kg (249 lb 5.4 oz) (10/07 2146) Weight change: 1.1 kg (2 lb 6.8 oz)  Intake/Output from previous day:   Intake/Output this shift:   Lab Results:  Recent Labs  09/20/14 0451 09/21/14 0752  WBC 5.7 6.5  HGB 10.0* 11.0*  HCT 30.7* 34.0*  PLT 127* 137*   BMET:  Recent Labs  09/20/14 0451 09/21/14 0752  NA 137 138  K 5.1 5.1  CL 96 96  CO2 23 23  GLUCOSE 93 89  BUN 55* 43*  CREATININE 13.74* 11.53*  CALCIUM 8.4 8.8  ALBUMIN 3.4* 3.8   No results found for this basename: PTH,  in the last 72 hours Iron Studies: No results found for this basename: IRON, TIBC, TRANSFERRIN, FERRITIN,  in the last 72 hours  Studies/Results: Ir Fluoro Guide Cv Line Left  09/21/2014   CLINICAL DATA:  Malfunctioning tunneled hemodialysis catheter placed via right femoral vein with associated thigh hematoma. Request has been made to remove the right femoral catheter and place a new tunneled catheter on the left.  EXAM: 1. REMOVAL OF TUNNELED CENTRAL VENOUS HEMODIALYSIS CATHETER 2. TUNNELED CENTRAL VENOUS HEMODIALYSIS CATHETER PLACEMENT WITH ULTRASOUND AND FLUOROSCOPIC GUIDANCE  ANESTHESIA/SEDATION: 1.0 mg IV Versed; 75 mcg IV Fentanyl.  Total Moderate Sedation Time  60 minutes.  MEDICATIONS: 2 g IV Ancef. As antibiotic prophylaxis, Ancef was ordered pre-procedure and administered intravenously within one hour of incision.  FLUOROSCOPY TIME:  30 seconds.  PROCEDURE: The procedure, risks, benefits, and alternatives were explained to the patient. Questions regarding the procedure were encouraged and answered.  The patient understands and consents to the procedure.  The a indwelling right femoral tunneled dialysis catheter and surrounding skin was prepped with chlorhexidine. The catheter was removed after infiltration of 1% lidocaine around the catheter exit site and use of made traction as well as blunt dissection.  The left thigh was prepped with chlorhexidine in a sterile fashion, and a sterile drape was applied covering the operative field. Maximum barrier sterile technique with sterile gowns and gloves were used for the procedure. Local anesthesia was provided with 1% lidocaine.  Ultrasound was used to confirm patency of the left common femoral vein. After creating a small venotomy incision, a 21 gauge needle was advanced into the left common femoral vein under direct, real-time ultrasound guidance. Ultrasound image documentation was performed. After securing guidewire access, an 8 Fr dilator was placed. A J-wire was kinked to measure appropriate catheter length.  An Ash Split tunneled hemodialysis catheter measuring 50 cm from tip to cuff was chosen for placement. This was tunneled in a retrograde fashion from the chest wall to the venotomy incision.  At the venotomy, serial dilatation was performed and a 14 Fr peel-away sheath was placed over a guidewire. The catheter was then placed through the sheath and the sheath removed. Final catheter positioning was confirmed and documented with a fluoroscopic spot image. The catheter was aspirated, flushed with saline, and injected with appropriate volume heparin dwells.  The venotomy incision was closed with subcutaneous 3-0 Monocryl and subcuticular 4-0 Vicryl. Dermabond was applied to  the incision. The catheter exit site was secured with 0-Prolene retention sutures.  COMPLICATIONS: None.  No pneumothorax.  FINDINGS: After catheter placement, the tips lie in the right atrium. The catheter aspirates normally and is ready for immediate use.  IMPRESSION: 1. Removal of right  femoral tunneled hemodialysis catheter. 2. Placement of new tunneled hemodialysis catheter via the left common femoral vein. The catheter tips lie in the right atrium. The catheter is ready for immediate use.   Electronically Signed   By: Aletta Edouard M.D.   On: 09/21/2014 19:05   Ir Removal Tun Cv Cath W/o Fl  09/21/2014   CLINICAL DATA:  Malfunctioning tunneled hemodialysis catheter placed via right femoral vein with associated thigh hematoma. Request has been made to remove the right femoral catheter and place a new tunneled catheter on the left.  EXAM: 1. REMOVAL OF TUNNELED CENTRAL VENOUS HEMODIALYSIS CATHETER 2. TUNNELED CENTRAL VENOUS HEMODIALYSIS CATHETER PLACEMENT WITH ULTRASOUND AND FLUOROSCOPIC GUIDANCE  ANESTHESIA/SEDATION: 1.0 mg IV Versed; 75 mcg IV Fentanyl.  Total Moderate Sedation Time  60 minutes.  MEDICATIONS: 2 g IV Ancef. As antibiotic prophylaxis, Ancef was ordered pre-procedure and administered intravenously within one hour of incision.  FLUOROSCOPY TIME:  30 seconds.  PROCEDURE: The procedure, risks, benefits, and alternatives were explained to the patient. Questions regarding the procedure were encouraged and answered. The patient understands and consents to the procedure.  The a indwelling right femoral tunneled dialysis catheter and surrounding skin was prepped with chlorhexidine. The catheter was removed after infiltration of 1% lidocaine around the catheter exit site and use of made traction as well as blunt dissection.  The left thigh was prepped with chlorhexidine in a sterile fashion, and a sterile drape was applied covering the operative field. Maximum barrier sterile technique with sterile gowns and gloves were used for the procedure. Local anesthesia was provided with 1% lidocaine.  Ultrasound was used to confirm patency of the left common femoral vein. After creating a small venotomy incision, a 21 gauge needle was advanced into the left common femoral vein under direct,  real-time ultrasound guidance. Ultrasound image documentation was performed. After securing guidewire access, an 8 Fr dilator was placed. A J-wire was kinked to measure appropriate catheter length.  An Ash Split tunneled hemodialysis catheter measuring 50 cm from tip to cuff was chosen for placement. This was tunneled in a retrograde fashion from the chest wall to the venotomy incision.  At the venotomy, serial dilatation was performed and a 14 Fr peel-away sheath was placed over a guidewire. The catheter was then placed through the sheath and the sheath removed. Final catheter positioning was confirmed and documented with a fluoroscopic spot image. The catheter was aspirated, flushed with saline, and injected with appropriate volume heparin dwells.  The venotomy incision was closed with subcutaneous 3-0 Monocryl and subcuticular 4-0 Vicryl. Dermabond was applied to the incision. The catheter exit site was secured with 0-Prolene retention sutures.  COMPLICATIONS: None.  No pneumothorax.  FINDINGS: After catheter placement, the tips lie in the right atrium. The catheter aspirates normally and is ready for immediate use.  IMPRESSION: 1. Removal of right femoral tunneled hemodialysis catheter. 2. Placement of new tunneled hemodialysis catheter via the left common femoral vein. The catheter tips lie in the right atrium. The catheter is ready for immediate use.   Electronically Signed   By: Aletta Edouard M.D.   On: 09/21/2014 19:05   Ir US Guide Vasc Access Left  09/21/2014   CLINICAL DATA:  Malfunctioning tunneled hemodialysis catheter placed via right femoral vein with associated thigh hematoma. Request has been made to remove the right femoral catheter and place a new tunneled catheter on the left.  EXAM: 1. REMOVAL OF TUNNELED CENTRAL VENOUS HEMODIALYSIS CATHETER 2. TUNNELED CENTRAL VENOUS HEMODIALYSIS CATHETER PLACEMENT WITH ULTRASOUND AND FLUOROSCOPIC GUIDANCE  ANESTHESIA/SEDATION: 1.0 mg IV Versed; 75 mcg IV  Fentanyl.  Total Moderate Sedation Time  60 minutes.  MEDICATIONS: 2 g IV Ancef. As antibiotic prophylaxis, Ancef was ordered pre-procedure and administered intravenously within one hour of incision.  FLUOROSCOPY TIME:  30 seconds.  PROCEDURE: The procedure, risks, benefits, and alternatives were explained to the patient. Questions regarding the procedure were encouraged and answered. The patient understands and consents to the procedure.  The a indwelling right femoral tunneled dialysis catheter and surrounding skin was prepped with chlorhexidine. The catheter was removed after infiltration of 1% lidocaine around the catheter exit site and use of made traction as well as blunt dissection.  The left thigh was prepped with chlorhexidine in a sterile fashion, and a sterile drape was applied covering the operative field. Maximum barrier sterile technique with sterile gowns and gloves were used for the procedure. Local anesthesia was provided with 1% lidocaine.  Ultrasound was used to confirm patency of the left common femoral vein. After creating a small venotomy incision, a 21 gauge needle was advanced into the left common femoral vein under direct, real-time ultrasound guidance. Ultrasound image documentation was performed. After securing guidewire access, an 8 Fr dilator was placed. A J-wire was kinked to measure appropriate catheter length.  An Ash Split tunneled hemodialysis catheter measuring 50 cm from tip to cuff was chosen for placement. This was tunneled in a retrograde fashion from the chest wall to the venotomy incision.  At the venotomy, serial dilatation was performed and a 14 Fr peel-away sheath was placed over a guidewire. The catheter was then placed through the sheath and the sheath removed. Final catheter positioning was confirmed and documented with a fluoroscopic spot image. The catheter was aspirated, flushed with saline, and injected with appropriate volume heparin dwells.  The venotomy incision  was closed with subcutaneous 3-0 Monocryl and subcuticular 4-0 Vicryl. Dermabond was applied to the incision. The catheter exit site was secured with 0-Prolene retention sutures.  COMPLICATIONS: None.  No pneumothorax.  FINDINGS: After catheter placement, the tips lie in the right atrium. The catheter aspirates normally and is ready for immediate use.  IMPRESSION: 1. Removal of right femoral tunneled hemodialysis catheter. 2. Placement of new tunneled hemodialysis catheter via the left common femoral vein. The catheter tips lie in the right atrium. The catheter is ready for immediate use.   Electronically Signed   By: Aletta Edouard M.D.   On: 09/21/2014 19:05   EXAM:  Gen: Alert, in no apparent distress  Resp: clear bilat  Cardio: RRR without murmur or rub  GI: + BS, soft and nontender  Ext: no LE edema  Access: R groin HD cath out, new L fem TDC; RUA HeRO access no bruit  Neuro: is nf, Ox 3, alert   [facility]: [facility]  4h 450/1.5 109kg 2/2.0 [place] P4 New R thigh AVG (10/4 by [facility]), occluded RUA HeRO access Heparin 3000 bolus + 3000 midRx prn  Hectorol 1 ug TIW  tsat 23% pth 144 P 6.1 Hb 12  Assessment/Plan: 1. Malfunctioning dialysis access - clotted RUA HeRO (placed @ [facility]), R femoral catheter (placed 10/4 @ [facility]) with poor flows during  HD 10/5 & 6; removed with placement of L femoral catheter per IR 10/7.  2. R groin hematoma - improving; no signs of vascular compromise, CT 10/6 showed no evidence of retroperitoneal or intrapelvic hemorrhage.  3. ESRD - HD on TTS @ [place], K 5.1. HD pending.  4. Hypotension/Volume - BP 92/49 on Midodrine 10 mg tid; wt 113.1 kg, close to EDW. 5. Anemia - Hgb 11, no Aranesp or Fe.  6. Sec HPT - Ca 8.8 (9 corrected), P 6.3; Hectorol 1 mcg, Sensipar 30 mg bid, Phoslo 3 with meals.  7. Nutrition - Alb 3.8, renal diet, multivitamin.     LOS: 3 days   LYLES,CHARLES 09/22/2014,7:19 AM  Pt seen, examined and agree w A/P as above. Marginal blood flows with new  L groin cath today at 200 BFR. Will contact [facility] to see what their recommendations are.   Kelly Splinter MD pager [phone]    cell [phone] 09/22/2014, 3:38 PM

## 2014-09-22 NOTE — ED Provider Notes (Signed)
CSN: [id number]     Arrival date & time 09/22/14  2045 History   First MD Initiated Contact with Patient 09/22/14 2108     Chief Complaint  Patient presents with  . Abdominal Cramping    (Consider location/radiation/quality/duration/timing/severity/associated sxs/prior Treatment) HPI Comments: Patient is a 48 year old female with history of ESRD (on T/Th/Sa HD and s/p kidney transplant in 2000) and chronic hypotension on midodrine who presents to the ED for further evaluation of diffuse muscle cramping. Patient states she began experiencing cramping in her posterior upper arms b/l, upper back, suprapubic abdomen, and low back today. Symptoms have been constant and waxing and waning in severity. Patient states she has a history of similar symptoms following dialysis, but they have never been this bad; patient was dialyzed today. She denies radiation of the pain and states she "can feel the muscles spasming". She denies associated fever, syncope, CP, SOB, N/V/D, extremity numbness/weakness, and incontinence.  Patient with hx of recent admission on 09/19/14 for problems with her HD catheter. New HD catheter placed in L groin yesterday.  PCP - Dr. Delfina Redwood Nephrologist - Dr. Jonnie Finner  The history is provided by the patient. No language interpreter was used.    Past Medical History  Diagnosis Date  . 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 Went back on HD Aug 2007 and remains on HD at [facility] on TTS schedule. Just got back on Tx list as of fall 2015.   Marland Kitchen Rectal bleeding 2007    hemorrhoids/diverticular disease  . Retroperitoneal hematoma 9/07    transfusion  . H/O parathyroidectomy 1999  . UTI (lower urinary tract infection)   . Adrenal insufficiency 2009  . Sleep apnea     does not have cpap at this time  . Oral contraceptive use     patient states has not had menstral cycle in 1 year.  . Hypertension     Dr. Seward Carol [phone]  . Chronic headaches   . H/O syncope   . Breast cancer     RT  . Anemia     hx of  . History of multiple failed HD accesses 09/19/2014    Has had multiple failed HD accesses, including but not limited to LUA AVF, RUA AVG, two LUA HeRO accesses, L thigh AVG, R thigh AVG and two RUA HeRO accesses.  As of fall 2015 her 2nd RUA HeRO occluded and attempt to salvage is pending. Access work is mainly done in [place] with [facility] also involved.    . Chronic hypotension 07/22/2012   Past Surgical History  Procedure Laterality Date  . Av fistula placement Right 05/08/2007  . Hero avg Left 12/14/2008  . Hero  04/17/2009    Removed clotted Left Hero  2) New Right Hero placed and Stent to SVC  . Breast lumpectomy Left   . Exploration renal s/p transplantation    . Tubal ligation    . Avgg removal  08/07/2012    Procedure: REMOVAL OF ARTERIOVENOUS GORETEX GRAFT ([place]);  Surgeon: Rosetta Posner, MD;  Location: [facility];  Service: Vascular;  Laterality: Right;   Family History  Problem Relation Age of Onset  . Diabetes Mother   . Diabetes Maternal Aunt     x 3  . Colon cancer Maternal Uncle   . Heart disease Maternal Grandmother   . Diabetes Maternal Grandmother   . Stroke Maternal  Grandmother   . Hypertension Brother   . Hypertension Brother   . Hypertension Brother   . Prostate cancer Maternal Uncle    History  Substance Use Topics  . Smoking status: Former Smoker    Quit date: 12/16/2002  . Smokeless tobacco: Never Used  . Alcohol Use: Yes     Comment: 2-3x/week   OB History   Grav Para Term Preterm Abortions TAB SAB Ect Mult Living                  Review of Systems  Constitutional: Negative for fever.  Respiratory: Negative for shortness of breath.   Cardiovascular: Negative for chest pain.  Gastrointestinal: Positive for abdominal pain. Negative for vomiting and diarrhea.  Musculoskeletal: Positive for back pain and myalgias.  Neurological: Negative for  weakness and numbness.  All other systems reviewed and are negative.   Allergies  Review of patient's allergies indicates no known allergies.  Home Medications   Prior to Admission medications   Medication Sig Start Date End Date Taking? Authorizing Provider  aspirin EC 325 MG tablet Take 325 mg by mouth daily.   Yes Historical Provider, MD  Calcium Acetate 667 MG TABS Take 1,334-2,001 mg by mouth 3 (three) times daily with meals. Take 2001 mg by mouth 3 times daily with meals. Take 1334 mg by mouth twice daily with snacks or deserts.   Yes Historical Provider, MD  clopidogrel (PLAVIX) 75 MG tablet Take 75 mg by mouth daily.     Yes Historical Provider, MD  fludrocortisone (FLORINEF) 0.1 MG tablet Take 0.2 mg by mouth daily.   Yes Historical Provider, MD  gabapentin (NEURONTIN) 100 MG capsule Take 100 mg by mouth at bedtime.  11/18/13  Yes Historical Provider, MD  midodrine (PROAMATINE) 10 MG tablet Take 1 tablet (10 mg total) by mouth 3 (three) times daily. 09/06/12  Yes Theodis Blaze, MD  norethindrone (MICRONOR,CAMILA,ERRIN) 0.35 MG tablet Take 1 tablet by mouth daily.   Yes Historical Provider, MD  oxyCODONE-acetaminophen (PERCOCET/ROXICET) 5-325 MG per tablet Take 1-2 tablets by mouth every 4 (four) hours as needed for severe pain.   Yes Historical Provider, MD  pseudoephedrine-acetaminophen (TYLENOL SINUS) 30-500 MG TABS Take 2 tablets by mouth every 4 (four) hours as needed (for sinus relief).   Yes Historical Provider, MD  SENSIPAR 30 MG tablet Take 30 mg by mouth daily.  07/08/12  Yes Historical Provider, MD   BP 108/56  Pulse 87  Temp(Src) 98.7 F (37.1 C) (Oral)  Resp 19  SpO2 100%  Physical Exam  Nursing note and vitals reviewed. Constitutional: She is oriented to person, place, and time. She appears well-developed and well-nourished. No distress.  Patient appears uncomfortable; nontoxic/nonseptic appearing  HENT:  Head: Normocephalic and atraumatic.  Eyes: Conjunctivae  and EOM are normal. No scleral icterus.  Neck: Normal range of motion.  Cardiovascular: Regular rhythm and normal heart sounds.   Patient with mild tachycardia vs NSR while in exam room; 90's-105 bpm. Faint palpable DR pulse in LUE.  Pulmonary/Chest: Effort normal. No respiratory distress. She has no wheezes. She has no rales.  Chest expansion symmetric.  Abdominal: Soft. There is no tenderness. There is no rebound and no guarding.  Soft obese abdomen without TTP. No peritoneal signs or guarding.  Genitourinary:  HD catheter in L groin is C/D/I Dressing in R groin from prior HD catheter is D/I with mild evidence of serosanguinous drainage.  Musculoskeletal: Normal range of motion.  Neurological: She is  alert and oriented to person, place, and time. She exhibits normal muscle tone. Coordination normal.  GCS 15. Speech is goal oriented. Patient moves extremities without ataxia  Skin: Skin is warm and dry. No rash noted. She is not diaphoretic. No erythema. No pallor.  Psychiatric: She has a normal mood and affect. Her behavior is normal.    ED Course  Procedures (including critical care time) Labs Review Labs Reviewed  CBC WITH DIFFERENTIAL - Abnormal; Notable for the following:    Hemoglobin 11.9 (*)    Platelets 121 (*)    All other components within normal limits  COMPREHENSIVE METABOLIC PANEL - Abnormal; Notable for the following:    Chloride 92 (*)    Glucose, Bld 132 (*)    BUN 30 (*)    Creatinine, Ser 9.69 (*)    Total Protein 9.8 (*)    GFR calc non Af Amer 4 (*)    GFR calc Af Amer 5 (*)    Anion gap 23 (*)    All other components within normal limits  I-STAT CG4 LACTIC ACID, ED - Abnormal; Notable for the following:    Lactic Acid, Venous 3.78 (*)    All other components within normal limits  LIPASE, BLOOD  MAGNESIUM    Imaging Review Ir Fluoro Guide Cv Line Left  09/21/2014   CLINICAL DATA:  Malfunctioning tunneled hemodialysis catheter placed via right femoral  vein with associated thigh hematoma. Request has been made to remove the right femoral catheter and place a new tunneled catheter on the left.  EXAM: 1. REMOVAL OF TUNNELED CENTRAL VENOUS HEMODIALYSIS CATHETER 2. TUNNELED CENTRAL VENOUS HEMODIALYSIS CATHETER PLACEMENT WITH ULTRASOUND AND FLUOROSCOPIC GUIDANCE  ANESTHESIA/SEDATION: 1.0 mg IV Versed; 75 mcg IV Fentanyl.  Total Moderate Sedation Time  60 minutes.  MEDICATIONS: 2 g IV Ancef. As antibiotic prophylaxis, Ancef was ordered pre-procedure and administered intravenously within one hour of incision.  FLUOROSCOPY TIME:  30 seconds.  PROCEDURE: The procedure, risks, benefits, and alternatives were explained to the patient. Questions regarding the procedure were encouraged and answered. The patient understands and consents to the procedure.  The a indwelling right femoral tunneled dialysis catheter and surrounding skin was prepped with chlorhexidine. The catheter was removed after infiltration of 1% lidocaine around the catheter exit site and use of made traction as well as blunt dissection.  The left thigh was prepped with chlorhexidine in a sterile fashion, and a sterile drape was applied covering the operative field. Maximum barrier sterile technique with sterile gowns and gloves were used for the procedure. Local anesthesia was provided with 1% lidocaine.  Ultrasound was used to confirm patency of the left common femoral vein. After creating a small venotomy incision, a 21 gauge needle was advanced into the left common femoral vein under direct, real-time ultrasound guidance. Ultrasound image documentation was performed. After securing guidewire access, an 8 Fr dilator was placed. A J-wire was kinked to measure appropriate catheter length.  An Ash Split tunneled hemodialysis catheter measuring 50 cm from tip to cuff was chosen for placement. This was tunneled in a retrograde fashion from the chest wall to the venotomy incision.  At the venotomy, serial  dilatation was performed and a 14 Fr peel-away sheath was placed over a guidewire. The catheter was then placed through the sheath and the sheath removed. Final catheter positioning was confirmed and documented with a fluoroscopic spot image. The catheter was aspirated, flushed with saline, and injected with appropriate volume heparin dwells.  The venotomy  incision was closed with subcutaneous 3-0 Monocryl and subcuticular 4-0 Vicryl. Dermabond was applied to the incision. The catheter exit site was secured with 0-Prolene retention sutures.  COMPLICATIONS: None.  No pneumothorax.  FINDINGS: After catheter placement, the tips lie in the right atrium. The catheter aspirates normally and is ready for immediate use.  IMPRESSION: 1. Removal of right femoral tunneled hemodialysis catheter. 2. Placement of new tunneled hemodialysis catheter via the left common femoral vein. The catheter tips lie in the right atrium. The catheter is ready for immediate use.   Electronically Signed   By: Aletta Edouard M.D.   On: 09/21/2014 19:05   Ir Removal Tun Cv Cath W/o Fl  09/21/2014   CLINICAL DATA:  Malfunctioning tunneled hemodialysis catheter placed via right femoral vein with associated thigh hematoma. Request has been made to remove the right femoral catheter and place a new tunneled catheter on the left.  EXAM: 1. REMOVAL OF TUNNELED CENTRAL VENOUS HEMODIALYSIS CATHETER 2. TUNNELED CENTRAL VENOUS HEMODIALYSIS CATHETER PLACEMENT WITH ULTRASOUND AND FLUOROSCOPIC GUIDANCE  ANESTHESIA/SEDATION: 1.0 mg IV Versed; 75 mcg IV Fentanyl.  Total Moderate Sedation Time  60 minutes.  MEDICATIONS: 2 g IV Ancef. As antibiotic prophylaxis, Ancef was ordered pre-procedure and administered intravenously within one hour of incision.  FLUOROSCOPY TIME:  30 seconds.  PROCEDURE: The procedure, risks, benefits, and alternatives were explained to the patient. Questions regarding the procedure were encouraged and answered. The patient understands  and consents to the procedure.  The a indwelling right femoral tunneled dialysis catheter and surrounding skin was prepped with chlorhexidine. The catheter was removed after infiltration of 1% lidocaine around the catheter exit site and use of made traction as well as blunt dissection.  The left thigh was prepped with chlorhexidine in a sterile fashion, and a sterile drape was applied covering the operative field. Maximum barrier sterile technique with sterile gowns and gloves were used for the procedure. Local anesthesia was provided with 1% lidocaine.  Ultrasound was used to confirm patency of the left common femoral vein. After creating a small venotomy incision, a 21 gauge needle was advanced into the left common femoral vein under direct, real-time ultrasound guidance. Ultrasound image documentation was performed. After securing guidewire access, an 8 Fr dilator was placed. A J-wire was kinked to measure appropriate catheter length.  An Ash Split tunneled hemodialysis catheter measuring 50 cm from tip to cuff was chosen for placement. This was tunneled in a retrograde fashion from the chest wall to the venotomy incision.  At the venotomy, serial dilatation was performed and a 14 Fr peel-away sheath was placed over a guidewire. The catheter was then placed through the sheath and the sheath removed. Final catheter positioning was confirmed and documented with a fluoroscopic spot image. The catheter was aspirated, flushed with saline, and injected with appropriate volume heparin dwells.  The venotomy incision was closed with subcutaneous 3-0 Monocryl and subcuticular 4-0 Vicryl. Dermabond was applied to the incision. The catheter exit site was secured with 0-Prolene retention sutures.  COMPLICATIONS: None.  No pneumothorax.  FINDINGS: After catheter placement, the tips lie in the right atrium. The catheter aspirates normally and is ready for immediate use.  IMPRESSION: 1. Removal of right femoral tunneled  hemodialysis catheter. 2. Placement of new tunneled hemodialysis catheter via the left common femoral vein. The catheter tips lie in the right atrium. The catheter is ready for immediate use.   Electronically Signed   By: Aletta Edouard M.D.   On: 09/21/2014 19:05  Ir US Guide Vasc Access Left  09/21/2014   CLINICAL DATA:  Malfunctioning tunneled hemodialysis catheter placed via right femoral vein with associated thigh hematoma. Request has been made to remove the right femoral catheter and place a new tunneled catheter on the left.  EXAM: 1. REMOVAL OF TUNNELED CENTRAL VENOUS HEMODIALYSIS CATHETER 2. TUNNELED CENTRAL VENOUS HEMODIALYSIS CATHETER PLACEMENT WITH ULTRASOUND AND FLUOROSCOPIC GUIDANCE  ANESTHESIA/SEDATION: 1.0 mg IV Versed; 75 mcg IV Fentanyl.  Total Moderate Sedation Time  60 minutes.  MEDICATIONS: 2 g IV Ancef. As antibiotic prophylaxis, Ancef was ordered pre-procedure and administered intravenously within one hour of incision.  FLUOROSCOPY TIME:  30 seconds.  PROCEDURE: The procedure, risks, benefits, and alternatives were explained to the patient. Questions regarding the procedure were encouraged and answered. The patient understands and consents to the procedure.  The a indwelling right femoral tunneled dialysis catheter and surrounding skin was prepped with chlorhexidine. The catheter was removed after infiltration of 1% lidocaine around the catheter exit site and use of made traction as well as blunt dissection.  The left thigh was prepped with chlorhexidine in a sterile fashion, and a sterile drape was applied covering the operative field. Maximum barrier sterile technique with sterile gowns and gloves were used for the procedure. Local anesthesia was provided with 1% lidocaine.  Ultrasound was used to confirm patency of the left common femoral vein. After creating a small venotomy incision, a 21 gauge needle was advanced into the left common femoral vein under direct, real-time ultrasound  guidance. Ultrasound image documentation was performed. After securing guidewire access, an 8 Fr dilator was placed. A J-wire was kinked to measure appropriate catheter length.  An Ash Split tunneled hemodialysis catheter measuring 50 cm from tip to cuff was chosen for placement. This was tunneled in a retrograde fashion from the chest wall to the venotomy incision.  At the venotomy, serial dilatation was performed and a 14 Fr peel-away sheath was placed over a guidewire. The catheter was then placed through the sheath and the sheath removed. Final catheter positioning was confirmed and documented with a fluoroscopic spot image. The catheter was aspirated, flushed with saline, and injected with appropriate volume heparin dwells.  The venotomy incision was closed with subcutaneous 3-0 Monocryl and subcuticular 4-0 Vicryl. Dermabond was applied to the incision. The catheter exit site was secured with 0-Prolene retention sutures.  COMPLICATIONS: None.  No pneumothorax.  FINDINGS: After catheter placement, the tips lie in the right atrium. The catheter aspirates normally and is ready for immediate use.  IMPRESSION: 1. Removal of right femoral tunneled hemodialysis catheter. 2. Placement of new tunneled hemodialysis catheter via the left common femoral vein. The catheter tips lie in the right atrium. The catheter is ready for immediate use.   Electronically Signed   By: Aletta Edouard M.D.   On: 09/21/2014 19:05     EKG Interpretation None      MDM   Final diagnoses:  Dehydration  Spasm of muscle  Hypotension, unspecified hypotension type    48 year old female presents to the emergency department for muscle spasms to her posterior upper arms bilaterally as well as her low back and abdomen. Symptom onset was after hemodialysis today. Patient states that she has had similar symptoms in the past associated with dialysis, but symptoms were worse today than before. Patient with history of hypertension, but  was significantly hypotensive at 79/37 on arrival. Laboratory workup suggestive of dehydration with anion gap of 23 and elevated lactate to 3.78.  Symptoms  fairly well-controlled with IV fluid hydration as well as fentanyl. Patient, however, continues to be symptomatic and states that she does not feel comfortable going home should her symptoms recur. Have discussed admission with Dr. Ernestina Patches of [facility]. Dr. Ernestina Patches requests addition of magnesium which has been ordered. He will see the patient for admission.   Filed Vitals:   09/22/14 2215 09/22/14 2230 09/22/14 2315 09/22/14 2318  BP: 98/54 98/63 108/56 108/56  Pulse:  87  87  Temp:    98.7 F (37.1 C)  TempSrc:    Oral  Resp: 17 16 20 19   SpO2:  97%  100%     Antonietta Breach, PA-C 09/22/14 2334

## 2014-09-23 ENCOUNTER — Encounter (HOSPITAL_COMMUNITY): Payer: Self-pay | Admitting: *Deleted

## 2014-09-23 DIAGNOSIS — N186 End stage renal disease: Secondary | ICD-10-CM

## 2014-09-23 DIAGNOSIS — R252 Cramp and spasm: Secondary | ICD-10-CM

## 2014-09-23 DIAGNOSIS — Z992 Dependence on renal dialysis: Secondary | ICD-10-CM

## 2014-09-23 LAB — CBC WITH DIFFERENTIAL/PLATELET
BASOS PCT: 0 % (ref 0–1)
Basophils Absolute: 0 10*3/uL (ref 0.0–0.1)
Eosinophils Absolute: 0.3 10*3/uL (ref 0.0–0.7)
Eosinophils Relative: 4 % (ref 0–5)
HEMATOCRIT: 32.5 % — AB (ref 36.0–46.0)
Hemoglobin: 10.7 g/dL — ABNORMAL LOW (ref 12.0–15.0)
LYMPHS PCT: 11 % — AB (ref 12–46)
Lymphs Abs: 0.9 10*3/uL (ref 0.7–4.0)
MCH: 30 pg (ref 26.0–34.0)
MCHC: 32.9 g/dL (ref 30.0–36.0)
MCV: 91 fL (ref 78.0–100.0)
MONO ABS: 0.8 10*3/uL (ref 0.1–1.0)
MONOS PCT: 10 % (ref 3–12)
NEUTROS ABS: 5.7 10*3/uL (ref 1.7–7.7)
Neutrophils Relative %: 74 % (ref 43–77)
Platelets: 114 10*3/uL — ABNORMAL LOW (ref 150–400)
RBC: 3.57 MIL/uL — ABNORMAL LOW (ref 3.87–5.11)
RDW: 14.6 % (ref 11.5–15.5)
WBC: 7.7 10*3/uL (ref 4.0–10.5)

## 2014-09-23 LAB — COMPREHENSIVE METABOLIC PANEL
ALBUMIN: 3.9 g/dL (ref 3.5–5.2)
ALK PHOS: 56 U/L (ref 39–117)
ALT: 7 U/L (ref 0–35)
ANION GAP: 19 — AB (ref 5–15)
AST: 29 U/L (ref 0–37)
BILIRUBIN TOTAL: 0.3 mg/dL (ref 0.3–1.2)
BUN: 35 mg/dL — ABNORMAL HIGH (ref 6–23)
CHLORIDE: 95 meq/L — AB (ref 96–112)
CO2: 23 mEq/L (ref 19–32)
Calcium: 8.8 mg/dL (ref 8.4–10.5)
Creatinine, Ser: 10.01 mg/dL — ABNORMAL HIGH (ref 0.50–1.10)
GFR calc Af Amer: 5 mL/min — ABNORMAL LOW (ref >90)
GFR calc non Af Amer: 4 mL/min — ABNORMAL LOW (ref >90)
Glucose, Bld: 96 mg/dL (ref 70–99)
POTASSIUM: 4.9 meq/L (ref 3.7–5.3)
SODIUM: 137 meq/L (ref 137–147)
Total Protein: 8.8 g/dL — ABNORMAL HIGH (ref 6.0–8.3)

## 2014-09-23 MED ORDER — CALCIUM ACETATE 667 MG PO CAPS
1334.0000 mg | ORAL_CAPSULE | ORAL | Status: DC
  Administered 2014-09-23: 1334 mg via ORAL
  Filled 2014-09-23 (×3): qty 2

## 2014-09-23 MED ORDER — INFLUENZA VAC SPLIT QUAD 0.5 ML IM SUSY: 0.5000 mL | PREFILLED_SYRINGE | INTRAMUSCULAR | Status: DC

## 2014-09-23 MED ORDER — CALCIUM ACETATE 667 MG PO CAPS
2001.0000 mg | ORAL_CAPSULE | Freq: Three times a day (TID) | ORAL | Status: DC
  Administered 2014-09-23 (×2): 2001 mg via ORAL
  Filled 2014-09-23 (×4): qty 3

## 2014-09-23 MED ORDER — CYCLOBENZAPRINE HCL 5 MG PO TABS: 5.0000 mg | ORAL_TABLET | Freq: Three times a day (TID) | ORAL | Status: DC | PRN

## 2014-09-23 NOTE — ED Provider Notes (Signed)
Medical screening examination/treatment/procedure(s) were conducted as a shared visit with non-physician practitioner(s) and myself.  I personally evaluated the patient during the encounter.   EKG Interpretation   Date/Time:  Thursday September 22 2014 20:56:27 EDT Ventricular Rate:  119 PR Interval:  150 QRS Duration: 62 QT Interval:  324 QTC Calculation: 455 R Axis:   59 Text Interpretation:  Sinus tachycardia Low voltage QRS Borderline ECG No  significant change since last tracing Confirmed by Chasyn Cinque  MD, Natasa Stigall  ([id number]) on 09/22/2014 11:47:31 PM       Patient with hypotension and symptomatic cramping from dehydration. Once given small bolus of fluids she felt her cramping go away. Given her lactic acidosis and symptoms, patient prefers to be admitted for observation.  Ephraim Hamburger, MD 09/23/14 [phone]

## 2014-09-23 NOTE — Discharge Summary (Signed)
Physician Discharge Summary  Judy Green YQM:[id number] DOB: 1966/09/05 DOA: 09/22/2014  PCP: Kandice Hams, MD  Admit date: 09/22/2014 Discharge date: 09/23/2014  Discharge Diagnoses:  Active Problems:   Muscle cramps lactic acidosis ESRD   Discharge Condition: stable  Filed Weights   09/23/14 0108  Weight: 112 kg (246 lb 14.6 oz)    History of present illness:  48 y.o. year old female with significant past medical history of ESRD on HD TTS, chronic hypotension, hx/o vascular access issues presenting with muscle cramps. Pt was discharged from the renal service earlier today for HD catheter malfunction. Pt had R groin cath removed and new L groin catheter placed. Pt was dialyzed today and then discharged home. Pt states that she normally has mild cramping assd with HD. However, within 20 minutes of returning home, she developed severe muscle cramps that initially started in her upper extremities, but then moved across her back and the rest of her body. Pt states that she normally recieves IV saline during HD for sxs.  Pt presented to the ER because of worsening cramps. Presented to ER T 98.7, HR 10s-20s, BP 70s-100s, Satting >92% on RA. Labs including hgb, plts, Cr, glu stable from discharge earlier today. Pt was given 250 cc bolus in ER. Pt reports marked improvement in cramps after this. Pt, however feels uncomfortable going home as she will be home alone tomorrow. Lactate 3.78  Observed overnight. By discharge, no cramping  Procedures:  none  Consultations:  none  Discharge Exam: Filed Vitals:   09/23/14 0849  BP: 96/56  Pulse: 81  Temp: 98.3 F (36.8 C)  Resp: 18    General: comfortable Cardiovascular: RRR Respiratory: cta  Discharge Instructions   Activity as tolerated - No restrictions    Complete by:  As directed           Current Discharge Medication List    START taking these medications   Details  cyclobenzaprine (FLEXERIL) 5 MG tablet Take  1 tablet (5 mg total) by mouth 3 (three) times daily as needed for muscle spasms. Qty: 15 tablet, Refills: 0      CONTINUE these medications which have NOT CHANGED   Details  aspirin EC 325 MG tablet Take 325 mg by mouth daily.    Calcium Acetate 667 MG TABS Take 1,334-2,001 mg by mouth 3 (three) times daily with meals. Take 2001 mg by mouth 3 times daily with meals. Take 1334 mg by mouth twice daily with snacks or deserts.    clopidogrel (PLAVIX) 75 MG tablet Take 75 mg by mouth daily.      fludrocortisone (FLORINEF) 0.1 MG tablet Take 0.2 mg by mouth daily.    gabapentin (NEURONTIN) 100 MG capsule Take 100 mg by mouth at bedtime.     midodrine (PROAMATINE) 10 MG tablet Take 1 tablet (10 mg total) by mouth 3 (three) times daily. Qty: 30 tablet, Refills: 1    norethindrone (MICRONOR,CAMILA,ERRIN) 0.35 MG tablet Take 1 tablet by mouth daily.    oxyCODONE-acetaminophen (PERCOCET/ROXICET) 5-325 MG per tablet Take 1-2 tablets by mouth every 4 (four) hours as needed for severe pain.    pseudoephedrine-acetaminophen (TYLENOL SINUS) 30-500 MG TABS Take 2 tablets by mouth every 4 (four) hours as needed (for sinus relief).    SENSIPAR 30 MG tablet Take 30 mg by mouth daily.        No Known Allergies Follow-up Information   Follow up with Kandice Hams, MD. (If symptoms worsen)    Specialty:  Internal Medicine   Contact information:   [address] [place] [address] [phone]        The results of significant diagnostics from this hospitalization (including imaging, microbiology, ancillary and laboratory) are listed below for reference.    Significant Diagnostic Studies: Ct Abdomen Pelvis Wo Contrast  09/20/2014   CLINICAL DATA:  RIGHT groin hematoma and abdominal pain following hemodialysis catheter placement on 09/19/2014, assessment for retroperitoneal hemorrhage ; history of end-stage renal disease on dialysis, RIGHT breast cancer, hypertension  EXAM:  CT ABDOMEN AND PELVIS WITHOUT CONTRAST  TECHNIQUE: Multidetector CT imaging of the abdomen and pelvis was performed following the standard protocol without IV contrast. Sagittal and coronal MPR images reconstructed from axial data set.  COMPARISON:  05/10/2008  FINDINGS: Minimal atelectasis at LEFT lower lobe.  Numerous collaterals in the anterior abdominal wall and inferior chest.  Tip of dialysis catheter via RIGHT femoral approach in IVC near in RIGHT atrial junction.  Atrophic kidneys with cystic changes bilaterally.  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  Liver, spleen, pancreas, and adrenal glands unremarkable for technique.  Decompressed bladder.  Unremarkable uterus and adnexae.  Scattered colonic diverticulosis without evidence of diverticulitis.  Soft tissue swelling and infiltration at RIGHT inguinal region consistent with hematoma, unable to exclude infection by CT with this appearance.  No intrapelvic or retroperitoneal hemorrhage or abnormal fluid collection identified.  Stomach and bowel loops otherwise normal appearance.  No mass, adenopathy, or free fluid.  Small umbilical hernia containing fat.  No acute osseous findings.  IMPRESSION: No evidence of retroperitoneal or intrapelvic hemorrhage.  Soft tissue swelling and infiltration at RIGHT inguinal region could be related to hematoma but unable to exclude infection with this appearance.  Small umbilical hernia containing fat.   Electronically Signed   By: Lavonia Dana M.D.   On: 09/20/2014 14:50   Dg Abd 1 View  09/19/2014   CLINICAL DATA:  Status post placement of femoral dialysis catheter.  EXAM: ABDOMEN - 1 VIEW  COMPARISON:  Plain films the abdomen chest 12/14/2013.  FINDINGS: Right groin approach double lumen central venous catheter is in place. The tip of the distal lumen is at the inferior cavoatrial junction with proximal lumen 3.7 cm below the inferior cavoatrial junction. The bowel gas pattern is  nonobstructive. Calcifications projecting over the right ilium are chronic and unchanged.  IMPRESSION: Distal lumen of right IJ approach central venous catheter projects at the inferior cavoatrial junction.   Electronically Signed   By: Inge Rise M.D.   On: 09/19/2014 14:53   Ir Fluoro Guide Cv Line Left  09/21/2014   CLINICAL DATA:  Malfunctioning tunneled hemodialysis catheter placed via right femoral vein with associated thigh hematoma. Request has been made to remove the right femoral catheter and place a new tunneled catheter on the left.  EXAM: 1. REMOVAL OF TUNNELED CENTRAL VENOUS HEMODIALYSIS CATHETER 2. TUNNELED CENTRAL VENOUS HEMODIALYSIS CATHETER PLACEMENT WITH ULTRASOUND AND FLUOROSCOPIC GUIDANCE  ANESTHESIA/SEDATION: 1.0 mg IV Versed; 75 mcg IV Fentanyl.  Total Moderate Sedation Time  60 minutes.  MEDICATIONS: 2 g IV Ancef. As antibiotic prophylaxis, Ancef was ordered pre-procedure and administered intravenously within one hour of incision.  FLUOROSCOPY TIME:  30 seconds.  PROCEDURE: The procedure, risks, benefits, and alternatives were explained to the patient. Questions regarding the procedure were encouraged and answered. The patient understands and consents to the procedure.  The a indwelling right femoral tunneled dialysis catheter and surrounding skin was prepped  with chlorhexidine. The catheter was removed after infiltration of 1% lidocaine around the catheter exit site and use of made traction as well as blunt dissection.  The left thigh was prepped with chlorhexidine in a sterile fashion, and a sterile drape was applied covering the operative field. Maximum barrier sterile technique with sterile gowns and gloves were used for the procedure. Local anesthesia was provided with 1% lidocaine.  Ultrasound was used to confirm patency of the left common femoral vein. After creating a small venotomy incision, a 21 gauge needle was advanced into the left common femoral vein under direct,  real-time ultrasound guidance. Ultrasound image documentation was performed. After securing guidewire access, an 8 Fr dilator was placed. A J-wire was kinked to measure appropriate catheter length.  An Ash Split tunneled hemodialysis catheter measuring 50 cm from tip to cuff was chosen for placement. This was tunneled in a retrograde fashion from the chest wall to the venotomy incision.  At the venotomy, serial dilatation was performed and a 14 Fr peel-away sheath was placed over a guidewire. The catheter was then placed through the sheath and the sheath removed. Final catheter positioning was confirmed and documented with a fluoroscopic spot image. The catheter was aspirated, flushed with saline, and injected with appropriate volume heparin dwells.  The venotomy incision was closed with subcutaneous 3-0 Monocryl and subcuticular 4-0 Vicryl. Dermabond was applied to the incision. The catheter exit site was secured with 0-Prolene retention sutures.  COMPLICATIONS: None.  No pneumothorax.  FINDINGS: After catheter placement, the tips lie in the right atrium. The catheter aspirates normally and is ready for immediate use.  IMPRESSION: 1. Removal of right femoral tunneled hemodialysis catheter. 2. Placement of new tunneled hemodialysis catheter via the left common femoral vein. The catheter tips lie in the right atrium. The catheter is ready for immediate use.   Electronically Signed   By: Aletta Edouard M.D.   On: 09/21/2014 19:05   Ir Removal Tun Cv Cath W/o Fl  09/21/2014   CLINICAL DATA:  Malfunctioning tunneled hemodialysis catheter placed via right femoral vein with associated thigh hematoma. Request has been made to remove the right femoral catheter and place a new tunneled catheter on the left.  EXAM: 1. REMOVAL OF TUNNELED CENTRAL VENOUS HEMODIALYSIS CATHETER 2. TUNNELED CENTRAL VENOUS HEMODIALYSIS CATHETER PLACEMENT WITH ULTRASOUND AND FLUOROSCOPIC GUIDANCE  ANESTHESIA/SEDATION: 1.0 mg IV Versed; 75 mcg IV  Fentanyl.  Total Moderate Sedation Time  60 minutes.  MEDICATIONS: 2 g IV Ancef. As antibiotic prophylaxis, Ancef was ordered pre-procedure and administered intravenously within one hour of incision.  FLUOROSCOPY TIME:  30 seconds.  PROCEDURE: The procedure, risks, benefits, and alternatives were explained to the patient. Questions regarding the procedure were encouraged and answered. The patient understands and consents to the procedure.  The a indwelling right femoral tunneled dialysis catheter and surrounding skin was prepped with chlorhexidine. The catheter was removed after infiltration of 1% lidocaine around the catheter exit site and use of made traction as well as blunt dissection.  The left thigh was prepped with chlorhexidine in a sterile fashion, and a sterile drape was applied covering the operative field. Maximum barrier sterile technique with sterile gowns and gloves were used for the procedure. Local anesthesia was provided with 1% lidocaine.  Ultrasound was used to confirm patency of the left common femoral vein. After creating a small venotomy incision, a 21 gauge needle was advanced into the left common femoral vein under direct, real-time ultrasound guidance. Ultrasound image documentation was  performed. After securing guidewire access, an 8 Fr dilator was placed. A J-wire was kinked to measure appropriate catheter length.  An Ash Split tunneled hemodialysis catheter measuring 50 cm from tip to cuff was chosen for placement. This was tunneled in a retrograde fashion from the chest wall to the venotomy incision.  At the venotomy, serial dilatation was performed and a 14 Fr peel-away sheath was placed over a guidewire. The catheter was then placed through the sheath and the sheath removed. Final catheter positioning was confirmed and documented with a fluoroscopic spot image. The catheter was aspirated, flushed with saline, and injected with appropriate volume heparin dwells.  The venotomy incision  was closed with subcutaneous 3-0 Monocryl and subcuticular 4-0 Vicryl. Dermabond was applied to the incision. The catheter exit site was secured with 0-Prolene retention sutures.  COMPLICATIONS: None.  No pneumothorax.  FINDINGS: After catheter placement, the tips lie in the right atrium. The catheter aspirates normally and is ready for immediate use.  IMPRESSION: 1. Removal of right femoral tunneled hemodialysis catheter. 2. Placement of new tunneled hemodialysis catheter via the left common femoral vein. The catheter tips lie in the right atrium. The catheter is ready for immediate use.   Electronically Signed   By: Aletta Edouard M.D.   On: 09/21/2014 19:05   Ir Cv Line Injection  09/20/2014   CLINICAL DATA:  Right groin tunneled dialysis catheter check  EXAM: CENTRAL VENOUS CATHETER  PROCEDURE: Contrast was injected into each lumen of the right groin dialysis catheter. Both lumens are widely patent with its tip at the IVC right atrial junction.  Examination of the right groin demonstrates a 15 cm soft tissue hematoma. It is non pulsatile. The dressing is slightly bloody. No obvious abrupt bleeding from the entry site is identified.  FINDINGS: The tip of the right groin dialysis catheter is at the IVC right atrial junction. Both lumens are widely patent.  IMPRESSION: Both lumens of the right groin dialysis catheter are widely patent.  Soft tissue hematoma in the right groin is noted.   Electronically Signed   By: Maryclare Bean M.D.   On: 09/20/2014 12:49   Ir US Guide Vasc Access Left  09/21/2014   CLINICAL DATA:  Malfunctioning tunneled hemodialysis catheter placed via right femoral vein with associated thigh hematoma. Request has been made to remove the right femoral catheter and place a new tunneled catheter on the left.  EXAM: 1. REMOVAL OF TUNNELED CENTRAL VENOUS HEMODIALYSIS CATHETER 2. TUNNELED CENTRAL VENOUS HEMODIALYSIS CATHETER PLACEMENT WITH ULTRASOUND AND FLUOROSCOPIC GUIDANCE  ANESTHESIA/SEDATION:  1.0 mg IV Versed; 75 mcg IV Fentanyl.  Total Moderate Sedation Time  60 minutes.  MEDICATIONS: 2 g IV Ancef. As antibiotic prophylaxis, Ancef was ordered pre-procedure and administered intravenously within one hour of incision.  FLUOROSCOPY TIME:  30 seconds.  PROCEDURE: The procedure, risks, benefits, and alternatives were explained to the patient. Questions regarding the procedure were encouraged and answered. The patient understands and consents to the procedure.  The a indwelling right femoral tunneled dialysis catheter and surrounding skin was prepped with chlorhexidine. The catheter was removed after infiltration of 1% lidocaine around the catheter exit site and use of made traction as well as blunt dissection.  The left thigh was prepped with chlorhexidine in a sterile fashion, and a sterile drape was applied covering the operative field. Maximum barrier sterile technique with sterile gowns and gloves were used for the procedure. Local anesthesia was provided with 1% lidocaine.  Ultrasound was used to  confirm patency of the left common femoral vein. After creating a small venotomy incision, a 21 gauge needle was advanced into the left common femoral vein under direct, real-time ultrasound guidance. Ultrasound image documentation was performed. After securing guidewire access, an 8 Fr dilator was placed. A J-wire was kinked to measure appropriate catheter length.  An Ash Split tunneled hemodialysis catheter measuring 50 cm from tip to cuff was chosen for placement. This was tunneled in a retrograde fashion from the chest wall to the venotomy incision.  At the venotomy, serial dilatation was performed and a 14 Fr peel-away sheath was placed over a guidewire. The catheter was then placed through the sheath and the sheath removed. Final catheter positioning was confirmed and documented with a fluoroscopic spot image. The catheter was aspirated, flushed with saline, and injected with appropriate volume heparin  dwells.  The venotomy incision was closed with subcutaneous 3-0 Monocryl and subcuticular 4-0 Vicryl. Dermabond was applied to the incision. The catheter exit site was secured with 0-Prolene retention sutures.  COMPLICATIONS: None.  No pneumothorax.  FINDINGS: After catheter placement, the tips lie in the right atrium. The catheter aspirates normally and is ready for immediate use.  IMPRESSION: 1. Removal of right femoral tunneled hemodialysis catheter. 2. Placement of new tunneled hemodialysis catheter via the left common femoral vein. The catheter tips lie in the right atrium. The catheter is ready for immediate use.   Electronically Signed   By: Aletta Edouard M.D.   On: 09/21/2014 19:05    Microbiology: No results found for this or any previous visit (from the past 240 hour(s)).   Labs: Basic Metabolic Panel:  Recent Labs Lab 09/20/14 0451 09/21/14 0752 09/22/14 0614 09/22/14 2055 09/22/14 2109 09/23/14 0017  NA 137 138 136* 138  --  137  K 5.1 5.1 5.5* 4.9  --  4.9  CL 96 96 95* 92*  --  95*  CO2 23 23 23 23   --  23  GLUCOSE 93 89 88 132*  --  96  BUN 55* 43* 60* 30*  --  35*  CREATININE 13.74* 11.53* 14.75* 9.69*  --  10.01*  CALCIUM 8.4 8.8 8.7 9.5  --  8.8  MG  --   --   --   --  2.3  --   PHOS 5.6* 6.3* 6.7*  --   --   --    Liver Function Tests:  Recent Labs Lab 09/20/14 0451 09/21/14 0752 09/22/14 0614 09/22/14 2055 09/23/14 0017  AST  --   --   --  34 29  ALT  --   --   --  9 7  ALKPHOS  --   --   --  60 56  BILITOT  --   --   --  0.5 0.3  PROT  --   --   --  9.8* 8.8*  ALBUMIN 3.4* 3.8 3.7 4.4 3.9    Recent Labs Lab 09/22/14 2109  LIPASE 27   No results found for this basename: AMMONIA,  in the last 168 hours CBC:  Recent Labs Lab 09/19/14 1847 09/20/14 0451 09/21/14 0752 09/22/14 2055 09/23/14 0017  WBC 7.5 5.7 6.5 7.3 7.7  NEUTROABS  --   --   --  5.4 5.7  HGB 10.5* 10.0* 11.0* 11.9* 10.7*  HCT 32.0* 30.7* 34.0* 36.2 32.5*  MCV 90.7 91.9  95.0 91.6 91.0  PLT 147* 127* 137* 121* 114*   Cardiac Enzymes: No results found for this  basename: CKTOTAL, CKMB, CKMBINDEX, TROPONINI,  in the last 168 hours BNP: BNP (last 3 results) No results found for this basename: PROBNP,  in the last 8760 hours CBG:  Recent Labs Lab 09/20/14 1200  GLUCAP 72       Signed:  [facility] 09/23/2014, 10:38 AM

## 2014-09-23 NOTE — Progress Notes (Signed)
Pt discharge instructions and prescription given, pt verbalized understanding.  VSS. Denies pain.  Pt waiting on transportation home.

## 2014-09-23 NOTE — Progress Notes (Signed)
New Admission Note:   Arrival Method: stretcher via ED Mental Orientation: Alert and Oriented x4 Telemetry: Placed on Box [id number] Assessment: Completed; see Doc flowsheets Skin: warm and dry IV: Left Upper Arm peripheral IV Pain: Denies Tubes: N/A Safety Measures: Educated on fall prevention safety plan, patient acknowledged and understood. Admission: Completed [address] Orientation: Patient has been orientated to the room, unit and staff.  Family: Aunt at bedside  Orders have been reviewed and implemented. Will continue to monitor the patient. Call light has been placed within reach and bed alarm has been activated.    Dorothea Glassman, RN  Phone number: [phone]

## 2014-10-12 ENCOUNTER — Other Ambulatory Visit: Payer: Self-pay | Admitting: Obstetrics & Gynecology

## 2014-10-18 NOTE — Telephone Encounter (Signed)
Patient in office for annual 06/23/2014- ok to refill?

## 2014-10-23 NOTE — Telephone Encounter (Signed)
Has h/o breast intraductal carcinoma insitu--OCP approved by her surgeon?  Recommend [place] to determine if menopausal.  Not a candidate for ERT.

## 2014-11-03 NOTE — Telephone Encounter (Signed)
Spoke to patient regarding Dr Glennon Mac Moore's concerns. She is going to have her blood drawn today at dialysis and she will make a follow up appointment.

## 2014-11-23 ENCOUNTER — Ambulatory Visit (INDEPENDENT_AMBULATORY_CARE_PROVIDER_SITE_OTHER): Payer: Medicare Other | Admitting: Obstetrics & Gynecology

## 2014-11-23 ENCOUNTER — Encounter: Payer: Self-pay | Admitting: Obstetrics & Gynecology

## 2014-11-23 VITALS — BP 119/76 | HR 90 | Temp 98.0°F

## 2014-11-23 DIAGNOSIS — Z3041 Encounter for surveillance of contraceptive pills: Secondary | ICD-10-CM

## 2014-11-23 DIAGNOSIS — N959 Unspecified menopausal and perimenopausal disorder: Secondary | ICD-10-CM

## 2014-11-23 MED ORDER — NORETHINDRONE 0.35 MG PO TABS: 1.0000 | ORAL_TABLET | Freq: Every day | ORAL | Status: AC

## 2014-11-23 NOTE — Patient Instructions (Signed)
Perimenopause Perimenopause is the time when your body begins to move into the menopause (no menstrual period for 12 straight months). It is a natural process. Perimenopause can begin 2-8 years before the menopause and usually lasts for 1 year after the menopause. During this time, your ovaries may or may not produce an egg. The ovaries vary in their production of estrogen and progesterone hormones each month. This can cause irregular menstrual periods, difficulty getting pregnant, vaginal bleeding between periods, and uncomfortable symptoms. CAUSES  Irregular production of the ovarian hormones, estrogen and progesterone, and not ovulating every month.  Other causes include:  Tumor of the pituitary gland in the brain.  Medical disease that affects the ovaries.  Radiation treatment.  Chemotherapy.  Unknown causes.  Heavy smoking and excessive alcohol intake can bring on perimenopause sooner. SIGNS AND SYMPTOMS   Hot flashes.  Night sweats.  Irregular menstrual periods.  Decreased sex drive.  Vaginal dryness.  Headaches.  Mood swings.  Depression.  Memory problems.  Irritability.  Tiredness.  Weight gain.  Trouble getting pregnant.  The beginning of losing bone cells (osteoporosis).  The beginning of hardening of the arteries (atherosclerosis). DIAGNOSIS  Your health care provider will make a diagnosis by analyzing your age, menstrual history, and symptoms. He or she will do a physical exam and note any changes in your body, especially your female organs. Female hormone tests may or may not be helpful depending on the amount of female hormones you produce and when you produce them. However, other hormone tests may be helpful to rule out other problems. TREATMENT  In some cases, no treatment is needed. The decision on whether treatment is necessary during the perimenopause should be made by you and your health care provider based on how the symptoms are affecting you  and your lifestyle. Various treatments are available, such as:  Treating individual symptoms with a specific medicine for that symptom.  Herbal medicines that can help specific symptoms.  Counseling.  Group therapy. HOME CARE INSTRUCTIONS   Keep track of your menstrual periods (when they occur, how heavy they are, how long between periods, and how long they last) as well as your symptoms and when they started.  Only take over-the-counter or prescription medicines as directed by your health care provider.  Sleep and rest.  Exercise.  Eat a diet that contains calcium (good for your bones) and soy (acts like the estrogen hormone).  Do not smoke.  Avoid alcoholic beverages.  Take vitamin supplements as recommended by your health care provider. Taking vitamin E may help in certain cases.  Take calcium and vitamin D supplements to help prevent bone loss.  Group therapy is sometimes helpful.  Acupuncture may help in some cases. SEEK MEDICAL CARE IF:   You have questions about any symptoms you are having.  You need a referral to a specialist (gynecologist, psychiatrist, or psychologist). SEEK IMMEDIATE MEDICAL CARE IF:   You have vaginal bleeding.  Your period lasts longer than 8 days.  Your periods are recurring sooner than 21 days.  You have bleeding after intercourse.  You have severe depression.  You have pain when you urinate.  You have severe headaches.  You have vision problems. Document Released: 01/09/2005 Document Revised: 09/22/2013 Document Reviewed: 07/01/2013 [facility] Patient Information 2015 [place], [place]. This information is not intended to replace advice given to you by your health care provider. Make sure you discuss any questions you have with your health care provider. Perimenopause Perimenopause is the time  when your body begins to move into the menopause (no menstrual period for 12 straight months). It is a natural process. Perimenopause can  begin 2-8 years before the menopause and usually lasts for 1 year after the menopause. During this time, your ovaries may or may not produce an egg. The ovaries vary in their production of estrogen and progesterone hormones each month. This can cause irregular menstrual periods, difficulty getting pregnant, vaginal bleeding between periods, and uncomfortable symptoms. CAUSES  Irregular production of the ovarian hormones, estrogen and progesterone, and not ovulating every month.  Other causes include:  Tumor of the pituitary gland in the brain.  Medical disease that affects the ovaries.  Radiation treatment.  Chemotherapy.  Unknown causes.  Heavy smoking and excessive alcohol intake can bring on perimenopause sooner. SIGNS AND SYMPTOMS   Hot flashes.  Night sweats.  Irregular menstrual periods.  Decreased sex drive.  Vaginal dryness.  Headaches.  Mood swings.  Depression.  Memory problems.  Irritability.  Tiredness.  Weight gain.  Trouble getting pregnant.  The beginning of losing bone cells (osteoporosis).  The beginning of hardening of the arteries (atherosclerosis). DIAGNOSIS  Your health care provider will make a diagnosis by analyzing your age, menstrual history, and symptoms. He or she will do a physical exam and note any changes in your body, especially your female organs. Female hormone tests may or may not be helpful depending on the amount of female hormones you produce and when you produce them. However, other hormone tests may be helpful to rule out other problems. TREATMENT  In some cases, no treatment is needed. The decision on whether treatment is necessary during the perimenopause should be made by you and your health care provider based on how the symptoms are affecting you and your lifestyle. Various treatments are available, such as:  Treating individual symptoms with a specific medicine for that symptom.  Herbal medicines that can help  specific symptoms.  Counseling.  Group therapy. HOME CARE INSTRUCTIONS   Keep track of your menstrual periods (when they occur, how heavy they are, how long between periods, and how long they last) as well as your symptoms and when they started.  Only take over-the-counter or prescription medicines as directed by your health care provider.  Sleep and rest.  Exercise.  Eat a diet that contains calcium (good for your bones) and soy (acts like the estrogen hormone).  Do not smoke.  Avoid alcoholic beverages.  Take vitamin supplements as recommended by your health care provider. Taking vitamin E may help in certain cases.  Take calcium and vitamin D supplements to help prevent bone loss.  Group therapy is sometimes helpful.  Acupuncture may help in some cases. SEEK MEDICAL CARE IF:   You have questions about any symptoms you are having.  You need a referral to a specialist (gynecologist, psychiatrist, or psychologist). SEEK IMMEDIATE MEDICAL CARE IF:   You have vaginal bleeding.  Your period lasts longer than 8 days.  Your periods are recurring sooner than 21 days.  You have bleeding after intercourse.  You have severe depression.  You have pain when you urinate.  You have severe headaches.  You have vision problems. Document Released: 01/09/2005 Document Revised: 09/22/2013 Document Reviewed: 07/01/2013 [facility] Patient Information 2015 [place], [place]. This information is not intended to replace advice given to you by your health care provider. Make sure you discuss any questions you have with your health care provider. Menopause and Herbal Products Menopause is the normal time  of life when menstrual periods stop completely. Menopause is complete when you have missed 12 consecutive menstrual periods. It usually occurs between the ages of 63 to 53, with an average age of 46. Very rarely does a woman develop menopause before 49 years old. At menopause, your ovaries  stop producing the female hormones, estrogen and progesterone. This can cause undesirable symptoms and also affect your health. Sometimes the symptoms can occur 4 to 5 years before the menopause begins. There is no relationship between menopause and:  Oral contraceptives.  Number of children you had.  Race.  The age your menstrual periods started (menarche). Heavy smokers and very thin women may develop menopause earlier in life. Estrogen and progesterone hormone treatment is the usual method of treating menopausal symptoms. However, there are women who should not take hormone treatment. This is true of:   Women that have breast or uterine cancer.  Women who prefer not to take hormones because of certain side effects (abnormal uterine bleeding).  Women who are afraid that hormones may cause breast cancer.  Women who have a history of liver disease, heart disease, stroke, or blood clots. For these women, there are other medications that may help treat their menopausal symptoms. These medications are found in plants and botanical products. They can be found in the form of herbs, teas, oils, tinctures, and pills.  CAUSES:  The ovaries stop producing the female hormones estrogen and progesterone.  Other causes include:  Surgery to remove both ovaries.  The ovaries stop functioning for no know reason.  Tumors of the pituitary gland in the brain.  Medical disease that affects the ovaries and hormone production.  Radiation treatment to the abdomen or pelvis.  Chemotherapy that affects the ovaries. PHYTOESTROGENS: Phytoestrogens occur naturally in plants and plant products. They act like estrogen in the body. Herbal medications are made from these plants and botanical steroids. There are 3 types of phytoestrogens:  Isoflavones (genistein and daidzein) are found in soy, garbanzo beans, miso and tofu foods.  Ligins are found in the shell of seeds. They are used to make oils like  flaxseed oil. The bacteria in your intestine act on these foods to produce the estrogen-like hormones.  Coumestans are estrogen-like. Some of the foods they are found in include sunflower seeds and bean sprouts. CONDITIONS AND THEIR POSSIBLE HERBAL TREATMENT:  Hot flashes and night sweats.  Soy, black cohosh and evening primrose.  Irritability, insomnia, depression and memory problems.  Chasteberry, ginseng, and soy.  St. John's wort may be helpful for depression. However, there is a concern of it causing cataracts of the eye and may have bad effects on other medications. St. John's wort should not be taken for long time and without your caregiver's advice.  Loss of libido and vaginal and skin dryness.  Wild yam and soy.  Prevention of coronary heart disease and osteoporosis.  Soy and Isoflavones. Several studies have shown that some women benefit from herbal medications, but most of the studies have not consistently shown that these supplements are much better than placebo. Other forms of treatment to help women with menopausal symptoms include a balanced diet, rest, exercise, vitamin and calcium (with vitamin D) supplements, acupuncture, and group therapy when necessary. THOSE WHO SHOULD NOT TAKE HERBAL MEDICATIONS INCLUDE:  Women who are planning on getting pregnant unless told by your caregiver.  Women who are breastfeeding unless told by your caregiver.  Women who are taking other prescription medications unless told by your caregiver.  Infants, children, and elderly women unless told by your caregiver. Different herbal medications have different and unmeasured amounts of the herbal ingredients. There are no regulations, quality control, and standardization of the ingredients in herbal medications. Therefore, the amount of the ingredient in the medication may vary from one herb, pill, tea, oil or tincture to another. Many herbal medications can cause serious problems and can even  have poisonous effects if taken too much or too long. If problems develop, the medication should be stopped and recorded by your caregiver. HOME CARE INSTRUCTIONS  Do not take or give children herbal medications without your caregiver's advice.  Let your caregiver know all the medications you are taking. This includes prescription, over-the-counter, eye drops, and creams.  Do not take herbal medications longer or more than recommended.  Tell your caregiver about any side effects from the medication. SEEK MEDICAL CARE IF:  You develop a fever of 102 F (38.9 C), or as directed by your caregiver.  You feel sick to your stomach (nauseous), vomit, or have diarrhea.  You develop a rash.  You develop abdominal pain.  You develop severe headaches.  You start to have vision problems.  You feel dizzy or faint.  You start to feel numbness in any part of your body.  You start shaking (have convulsions). Document Released: 05/20/2008 Document Revised: 11/18/2012 Document Reviewed: 12/18/2010 [facility] Patient Information 2015 [place], [place]. This information is not intended to replace advice given to you by your health care provider. Make sure you discuss any questions you have with your health care provider.

## 2014-12-05 NOTE — Progress Notes (Signed)
Patient ID: Judy Green, female   DOB: 10-24-1966, 48 y.o.   MRN: [id number]  Chief Complaint  Patient presents with  . Follow-up    HPI Judy Green is a 48 y.o. female. No complaints.  HPI  Past Medical History  Diagnosis Date  . 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 Went back on HD Aug 2007 and remains on HD at [facility] on TTS schedule. Just got back on Tx list as of fall 2015.   Marland Kitchen Rectal bleeding 2007    hemorrhoids/diverticular disease  . Retroperitoneal hematoma 9/07    transfusion  . H/O parathyroidectomy 1999  . UTI (lower urinary tract infection)   . Adrenal insufficiency 2009  . Sleep apnea     does not have cpap at this time  . Oral contraceptive use     patient states has not had menstral cycle in 1 year.  . Hypertension     Dr. Seward Carol [phone]  . Chronic headaches   . H/O syncope   . Breast cancer     RT  . Anemia     hx of  . History of multiple failed HD accesses 09/19/2014    Has had multiple failed HD accesses, including but not limited to LUA AVF, RUA AVG, two LUA HeRO accesses, L thigh AVG, R thigh AVG and two RUA HeRO accesses.  As of fall 2015 her 2nd RUA HeRO occluded and attempt to salvage is pending. Access work is mainly done in [place] with [facility] also involved.    . Chronic hypotension 07/22/2012    Past Surgical History  Procedure Laterality Date  . Av fistula placement Right 05/08/2007  . Hero avg Left 12/14/2008  . Hero  04/17/2009    Removed clotted Left Hero  2) New Right Hero placed and Stent to SVC  . Breast lumpectomy Left   . Exploration renal s/p transplantation    . Tubal ligation    . Avgg removal  08/07/2012    Procedure: REMOVAL OF ARTERIOVENOUS GORETEX GRAFT ([place]);  Surgeon: Rosetta Posner, MD;  Location: [facility] OR;  Service: Vascular;  Laterality: Right;    Family History  Problem Relation Age of Onset  . Diabetes Mother   .  Diabetes Maternal Aunt     x 3  . Colon cancer Maternal Uncle   . Heart disease Maternal Grandmother   . Diabetes Maternal Grandmother   . Stroke Maternal Grandmother   . Hypertension Brother   . Hypertension Brother   . Hypertension Brother   . Prostate cancer Maternal Uncle     Social History History  Substance Use Topics  . Smoking status: Former Smoker    Quit date: 12/16/2002  . Smokeless tobacco: Never Used  . Alcohol Use: Yes     Comment: 2-3x/week    No Known Allergies  Current Outpatient Prescriptions  Medication Sig Dispense Refill  . aspirin EC 325 MG tablet Take 325 mg by mouth daily.    . Calcium Acetate 667 MG TABS Take 1,334-2,001 mg by mouth 3 (three) times daily with meals. Take 2001 mg by mouth 3 times daily with meals. Take 1334 mg by mouth twice daily with snacks or deserts.    . clopidogrel (PLAVIX) 75 MG tablet Take 75 mg by mouth daily.      . fludrocortisone (FLORINEF) 0.1 MG tablet Take 0.2 mg by mouth  daily.    . gabapentin (NEURONTIN) 100 MG capsule Take 100 mg by mouth at bedtime.     . midodrine (PROAMATINE) 10 MG tablet Take 1 tablet (10 mg total) by mouth 3 (three) times daily. 30 tablet 1  . Multiple Vitamins-Minerals (MULTIVITAMIN WITH MINERALS) tablet Take 1 tablet by mouth daily.    . SENSIPAR 30 MG tablet Take 30 mg by mouth daily.     . cyclobenzaprine (FLEXERIL) 5 MG tablet Take 1 tablet (5 mg total) by mouth 3 (three) times daily as needed for muscle spasms. (Patient not taking: Reported on 11/23/2014) 15 tablet 0  . norethindrone (MICRONOR,CAMILA,ERRIN) 0.35 MG tablet Take 1 tablet (0.35 mg total) by mouth daily. 1 Package 5  . oxyCODONE-acetaminophen (PERCOCET/ROXICET) 5-325 MG per tablet Take 1-2 tablets by mouth every 4 (four) hours as needed for severe pain.    . pseudoephedrine-acetaminophen (TYLENOL SINUS) 30-500 MG TABS Take 2 tablets by mouth every 4 (four) hours as needed (for sinus relief).     No current facility-administered  medications for this visit.    Review of Systems Review of Systems Constitutional: negative for fatigue and weight loss Respiratory: negative for cough and wheezing Cardiovascular: negative for chest pain, fatigue and palpitations Gastrointestinal: negative for abdominal pain and change in bowel habits Genitourinary:negative for abnormal vaginal discharge Integument/breast: negative for nipple discharge Musculoskeletal:negative for myalgias Neurological: negative for gait problems and tremors Behavioral/Psych: negative for abusive relationship, depression Endocrine: negative for temperature intolerance     Blood pressure 119/76, pulse 90, temperature 98 F (36.7 C).  Physical Exam Physical Exam   50% of 15 min visit spent on counseling and coordination of care.   Data Reviewed None  Assessment    Perimenopausal H/O intraductal carcinoma insitu    Plan    If OCP use not contraindicated in light of pt's history-->continue minipill x 1 yr  Meds ordered this encounter  Medications  . Multiple Vitamins-Minerals (MULTIVITAMIN WITH MINERALS) tablet    Sig: Take 1 tablet by mouth daily.  . norethindrone (MICRONOR,CAMILA,ERRIN) 0.35 MG tablet    Sig: Take 1 tablet (0.35 mg total) by mouth daily.    Dispense:  1 Package    Refill:  5    Follow up as needed.         JACKSON-MOORE,Theophile Harvie A 12/05/2014, 7:37 PM

## 2014-12-10 ENCOUNTER — Other Ambulatory Visit: Payer: Self-pay | Admitting: Endocrinology

## 2014-12-12 ENCOUNTER — Other Ambulatory Visit: Payer: Self-pay | Admitting: *Deleted

## 2014-12-12 ENCOUNTER — Encounter: Payer: Self-pay | Admitting: *Deleted

## 2014-12-12 MED ORDER — FLUDROCORTISONE ACETATE 0.1 MG PO TABS: 0.2000 mg | ORAL_TABLET | Freq: Every day | ORAL | Status: AC

## 2014-12-13 ENCOUNTER — Encounter: Payer: Self-pay | Admitting: Obstetrics & Gynecology

## 2015-02-03 ENCOUNTER — Other Ambulatory Visit: Payer: Self-pay | Admitting: Endocrinology

## 2015-04-17 ENCOUNTER — Ambulatory Visit: Payer: Medicare Other | Admitting: Obstetrics

## 2015-05-22 ENCOUNTER — Other Ambulatory Visit: Payer: Self-pay | Admitting: Internal Medicine

## 2015-05-22 ENCOUNTER — Ambulatory Visit
Admission: RE | Admit: 2015-05-22 | Discharge: 2015-05-22 | Disposition: A | Discharge status: Home / Self Care | Payer: Medicare Other | Source: Ambulatory Visit | Attending: Internal Medicine | Admitting: Internal Medicine

## 2015-05-22 DIAGNOSIS — M25552 Pain in left hip: Secondary | ICD-10-CM

## 2015-07-01 ENCOUNTER — Other Ambulatory Visit (HOSPITAL_COMMUNITY)
Admit: 2015-07-01 | Discharge: 2015-07-01 | Disposition: A | Discharge status: Home / Self Care | Payer: Medicare Other | Attending: Nephrology | Admitting: Nephrology

## 2015-07-01 DIAGNOSIS — N186 End stage renal disease: Secondary | ICD-10-CM | POA: Diagnosis present

## 2015-07-01 LAB — POTASSIUM: Potassium: 3.7 mmol/L (ref 3.5–5.1)

## 2015-08-05 ENCOUNTER — Other Ambulatory Visit (HOSPITAL_COMMUNITY)
Admit: 2015-08-05 | Discharge: 2015-08-05 | Disposition: A | Discharge status: Home / Self Care | Payer: Medicare Other | Source: Other Acute Inpatient Hospital | Attending: Nephrology | Admitting: Nephrology

## 2015-08-05 DIAGNOSIS — N186 End stage renal disease: Secondary | ICD-10-CM | POA: Insufficient documentation

## 2015-08-05 LAB — POTASSIUM: POTASSIUM: 5.3 mmol/L — AB (ref 3.5–5.1)

## 2015-08-15 ENCOUNTER — Encounter (HOSPITAL_COMMUNITY): Payer: Self-pay | Admitting: Emergency Medicine

## 2015-08-15 ENCOUNTER — Inpatient Hospital Stay (HOSPITAL_COMMUNITY)
Admission: EM | Admit: 2015-08-15 | Discharge: 2015-08-21 | DRG: 981 | Disposition: A | Discharge status: Home / Self Care | Payer: Medicare Other | Attending: Internal Medicine | Admitting: Internal Medicine

## 2015-08-15 ENCOUNTER — Emergency Department (HOSPITAL_COMMUNITY): Payer: Medicare Other

## 2015-08-15 DIAGNOSIS — Z992 Dependence on renal dialysis: Secondary | ICD-10-CM | POA: Diagnosis not present

## 2015-08-15 DIAGNOSIS — R933 Abnormal findings on diagnostic imaging of other parts of digestive tract: Secondary | ICD-10-CM | POA: Insufficient documentation

## 2015-08-15 DIAGNOSIS — D631 Anemia in chronic kidney disease: Secondary | ICD-10-CM | POA: Insufficient documentation

## 2015-08-15 DIAGNOSIS — N2581 Secondary hyperparathyroidism of renal origin: Secondary | ICD-10-CM | POA: Diagnosis present

## 2015-08-15 DIAGNOSIS — Z87891 Personal history of nicotine dependence: Secondary | ICD-10-CM | POA: Diagnosis not present

## 2015-08-15 DIAGNOSIS — J9600 Acute respiratory failure, unspecified whether with hypoxia or hypercapnia: Secondary | ICD-10-CM | POA: Diagnosis not present

## 2015-08-15 DIAGNOSIS — N186 End stage renal disease: Secondary | ICD-10-CM

## 2015-08-15 DIAGNOSIS — K633 Ulcer of intestine: Secondary | ICD-10-CM | POA: Diagnosis present

## 2015-08-15 DIAGNOSIS — Z8249 Family history of ischemic heart disease and other diseases of the circulatory system: Secondary | ICD-10-CM

## 2015-08-15 DIAGNOSIS — E871 Hypo-osmolality and hyponatremia: Secondary | ICD-10-CM | POA: Diagnosis present

## 2015-08-15 DIAGNOSIS — Z9012 Acquired absence of left breast and nipple: Secondary | ICD-10-CM | POA: Diagnosis present

## 2015-08-15 DIAGNOSIS — Z7902 Long term (current) use of antithrombotics/antiplatelets: Secondary | ICD-10-CM

## 2015-08-15 DIAGNOSIS — K529 Noninfective gastroenteritis and colitis, unspecified: Secondary | ICD-10-CM | POA: Diagnosis present

## 2015-08-15 DIAGNOSIS — K5730 Diverticulosis of large intestine without perforation or abscess without bleeding: Secondary | ICD-10-CM | POA: Diagnosis present

## 2015-08-15 DIAGNOSIS — I9589 Other hypotension: Secondary | ICD-10-CM

## 2015-08-15 DIAGNOSIS — E2740 Unspecified adrenocortical insufficiency: Secondary | ICD-10-CM | POA: Diagnosis present

## 2015-08-15 DIAGNOSIS — Z923 Personal history of irradiation: Secondary | ICD-10-CM | POA: Diagnosis not present

## 2015-08-15 DIAGNOSIS — D638 Anemia in other chronic diseases classified elsewhere: Secondary | ICD-10-CM | POA: Diagnosis not present

## 2015-08-15 DIAGNOSIS — T82868A Thrombosis of vascular prosthetic devices, implants and grafts, initial encounter: Secondary | ICD-10-CM

## 2015-08-15 DIAGNOSIS — R1084 Generalized abdominal pain: Secondary | ICD-10-CM | POA: Diagnosis not present

## 2015-08-15 DIAGNOSIS — T8612 Kidney transplant failure: Secondary | ICD-10-CM | POA: Diagnosis present

## 2015-08-15 DIAGNOSIS — K550 Acute vascular disorders of intestine: Secondary | ICD-10-CM | POA: Diagnosis not present

## 2015-08-15 DIAGNOSIS — Z7982 Long term (current) use of aspirin: Secondary | ICD-10-CM

## 2015-08-15 DIAGNOSIS — N189 Chronic kidney disease, unspecified: Secondary | ICD-10-CM | POA: Diagnosis not present

## 2015-08-15 DIAGNOSIS — T82868D Thrombosis of vascular prosthetic devices, implants and grafts, subsequent encounter: Secondary | ICD-10-CM

## 2015-08-15 DIAGNOSIS — T82818A Embolism of vascular prosthetic devices, implants and grafts, initial encounter: Secondary | ICD-10-CM | POA: Diagnosis not present

## 2015-08-15 DIAGNOSIS — Z7682 Awaiting organ transplant status: Secondary | ICD-10-CM

## 2015-08-15 DIAGNOSIS — G4730 Sleep apnea, unspecified: Secondary | ICD-10-CM | POA: Diagnosis present

## 2015-08-15 DIAGNOSIS — Y832 Surgical operation with anastomosis, bypass or graft as the cause of abnormal reaction of the patient, or of later complication, without mention of misadventure at the time of the procedure: Secondary | ICD-10-CM | POA: Diagnosis not present

## 2015-08-15 DIAGNOSIS — Z79899 Other long term (current) drug therapy: Secondary | ICD-10-CM

## 2015-08-15 DIAGNOSIS — Y830 Surgical operation with transplant of whole organ as the cause of abnormal reaction of the patient, or of later complication, without mention of misadventure at the time of the procedure: Secondary | ICD-10-CM | POA: Diagnosis present

## 2015-08-15 DIAGNOSIS — T8241XD Breakdown (mechanical) of vascular dialysis catheter, subsequent encounter: Secondary | ICD-10-CM

## 2015-08-15 DIAGNOSIS — Z853 Personal history of malignant neoplasm of breast: Secondary | ICD-10-CM

## 2015-08-15 DIAGNOSIS — I120 Hypertensive chronic kidney disease with stage 5 chronic kidney disease or end stage renal disease: Secondary | ICD-10-CM | POA: Diagnosis present

## 2015-08-15 DIAGNOSIS — K55039 Acute (reversible) ischemia of large intestine, extent unspecified: Secondary | ICD-10-CM | POA: Insufficient documentation

## 2015-08-15 LAB — CBC WITH DIFFERENTIAL/PLATELET
BASOS ABS: 0 10*3/uL (ref 0.0–0.1)
Basophils Relative: 0 % (ref 0–1)
EOS PCT: 2 % (ref 0–5)
Eosinophils Absolute: 0.2 10*3/uL (ref 0.0–0.7)
HEMATOCRIT: 37.7 % (ref 36.0–46.0)
HEMOGLOBIN: 12.3 g/dL (ref 12.0–15.0)
LYMPHS ABS: 1 10*3/uL (ref 0.7–4.0)
LYMPHS PCT: 10 % — AB (ref 12–46)
MCH: 32 pg (ref 26.0–34.0)
MCHC: 32.6 g/dL (ref 30.0–36.0)
MCV: 98.2 fL (ref 78.0–100.0)
Monocytes Absolute: 0.7 10*3/uL (ref 0.1–1.0)
Monocytes Relative: 6 % (ref 3–12)
NEUTROS ABS: 8.4 10*3/uL — AB (ref 1.7–7.7)
NEUTROS PCT: 82 % — AB (ref 43–77)
Platelets: 185 10*3/uL (ref 150–400)
RBC: 3.84 MIL/uL — AB (ref 3.87–5.11)
RDW: 16 % — ABNORMAL HIGH (ref 11.5–15.5)
WBC: 10.3 10*3/uL (ref 4.0–10.5)

## 2015-08-15 LAB — COMPREHENSIVE METABOLIC PANEL
ALBUMIN: 3.8 g/dL (ref 3.5–5.0)
ALT: 17 U/L (ref 14–54)
ANION GAP: 19 — AB (ref 5–15)
AST: 24 U/L (ref 15–41)
Alkaline Phosphatase: 106 U/L (ref 38–126)
BUN: 68 mg/dL — AB (ref 6–20)
CHLORIDE: 93 mmol/L — AB (ref 101–111)
CO2: 22 mmol/L (ref 22–32)
Calcium: 8.1 mg/dL — ABNORMAL LOW (ref 8.9–10.3)
Creatinine, Ser: 16.24 mg/dL — ABNORMAL HIGH (ref 0.44–1.00)
GFR calc Af Amer: 3 mL/min — ABNORMAL LOW (ref >60)
GFR, EST NON AFRICAN AMERICAN: 2 mL/min — AB (ref >60)
Glucose, Bld: 86 mg/dL (ref 65–99)
POTASSIUM: 4.1 mmol/L (ref 3.5–5.1)
Sodium: 134 mmol/L — ABNORMAL LOW (ref 135–145)
TOTAL PROTEIN: 7.2 g/dL (ref 6.5–8.1)
Total Bilirubin: 1.2 mg/dL (ref 0.3–1.2)

## 2015-08-15 LAB — LIPASE, BLOOD: LIPASE: 23 U/L (ref 22–51)

## 2015-08-15 LAB — I-STAT TROPONIN, ED: Troponin i, poc: 0 ng/mL (ref 0.00–0.08)

## 2015-08-15 MED ORDER — ONDANSETRON HCL 4 MG PO TABS: 4.0000 mg | ORAL_TABLET | Freq: Four times a day (QID) | ORAL | Status: DC | PRN

## 2015-08-15 MED ORDER — ONDANSETRON HCL 4 MG/2ML IJ SOLN: 4.0000 mg | Freq: Four times a day (QID) | INTRAMUSCULAR | Status: DC | PRN

## 2015-08-15 MED ORDER — CALCIUM ACETATE (PHOS BINDER) 667 MG PO CAPS
2001.0000 mg | ORAL_CAPSULE | Freq: Three times a day (TID) | ORAL | Status: DC
  Administered 2015-08-16 – 2015-08-21 (×10): 2001 mg via ORAL
  Filled 2015-08-15 (×11): qty 3

## 2015-08-15 MED ORDER — METRONIDAZOLE IN NACL 5-0.79 MG/ML-% IV SOLN
500.0000 mg | Freq: Once | INTRAVENOUS | Status: AC
  Administered 2015-08-15: 500 mg via INTRAVENOUS
  Filled 2015-08-15: qty 100

## 2015-08-15 MED ORDER — ACETAMINOPHEN 325 MG PO TABS
650.0000 mg | ORAL_TABLET | Freq: Four times a day (QID) | ORAL | Status: DC | PRN
  Filled 2015-08-15: qty 2

## 2015-08-15 MED ORDER — CIPROFLOXACIN IN D5W 400 MG/200ML IV SOLN: 400.0000 mg | INTRAVENOUS | Status: DC

## 2015-08-15 MED ORDER — ENOXAPARIN SODIUM 30 MG/0.3ML ~~LOC~~ SOLN
30.0000 mg | SUBCUTANEOUS | Status: DC
  Administered 2015-08-16: 30 mg via SUBCUTANEOUS
  Filled 2015-08-15: qty 0.3

## 2015-08-15 MED ORDER — CLOPIDOGREL BISULFATE 75 MG PO TABS: 75.0000 mg | ORAL_TABLET | Freq: Every day | ORAL | Status: DC

## 2015-08-15 MED ORDER — GABAPENTIN 100 MG PO CAPS
100.0000 mg | ORAL_CAPSULE | Freq: Every day | ORAL | Status: DC
  Administered 2015-08-16 – 2015-08-20 (×6): 100 mg via ORAL
  Filled 2015-08-15 (×6): qty 1

## 2015-08-15 MED ORDER — CIPROFLOXACIN IN D5W 400 MG/200ML IV SOLN
400.0000 mg | Freq: Once | INTRAVENOUS | Status: AC
  Administered 2015-08-15: 400 mg via INTRAVENOUS
  Filled 2015-08-15: qty 200

## 2015-08-15 MED ORDER — ASPIRIN EC 325 MG PO TBEC
325.0000 mg | DELAYED_RELEASE_TABLET | Freq: Every day | ORAL | Status: DC
  Administered 2015-08-16: 325 mg via ORAL
  Filled 2015-08-15: qty 1

## 2015-08-15 MED ORDER — FENTANYL CITRATE (PF) 100 MCG/2ML IJ SOLN
25.0000 ug | Freq: Once | INTRAMUSCULAR | Status: AC
  Administered 2015-08-15: 25 ug via INTRAVENOUS
  Filled 2015-08-15: qty 2

## 2015-08-15 MED ORDER — CALCIUM ACETATE (PHOS BINDER) 667 MG PO CAPS: 1334.0000 mg | ORAL_CAPSULE | Freq: Two times a day (BID) | ORAL | Status: DC | PRN

## 2015-08-15 MED ORDER — FENTANYL CITRATE (PF) 100 MCG/2ML IJ SOLN
25.0000 ug | INTRAMUSCULAR | Status: DC | PRN
  Administered 2015-08-16 – 2015-08-20 (×11): 25 ug via INTRAVENOUS
  Filled 2015-08-15 (×14): qty 2

## 2015-08-15 MED ORDER — MIDODRINE HCL 5 MG PO TABS
10.0000 mg | ORAL_TABLET | Freq: Three times a day (TID) | ORAL | Status: DC
  Administered 2015-08-16 – 2015-08-21 (×15): 10 mg via ORAL
  Filled 2015-08-15 (×15): qty 2

## 2015-08-15 MED ORDER — SODIUM CHLORIDE 0.9 % IJ SOLN
3.0000 mL | Freq: Two times a day (BID) | INTRAMUSCULAR | Status: DC
  Administered 2015-08-16 – 2015-08-20 (×8): 3 mL via INTRAVENOUS

## 2015-08-15 MED ORDER — ACETAMINOPHEN 650 MG RE SUPP: 650.0000 mg | Freq: Four times a day (QID) | RECTAL | Status: DC | PRN

## 2015-08-15 MED ORDER — SODIUM CHLORIDE 0.9 % IV SOLN: INTRAVENOUS | Status: DC

## 2015-08-15 MED ORDER — FLUDROCORTISONE ACETATE 0.1 MG PO TABS
0.2000 mg | ORAL_TABLET | Freq: Every day | ORAL | Status: DC
  Administered 2015-08-16 – 2015-08-21 (×6): 0.2 mg via ORAL
  Filled 2015-08-15 (×7): qty 2

## 2015-08-15 MED ORDER — FENTANYL CITRATE (PF) 100 MCG/2ML IJ SOLN
50.0000 ug | Freq: Once | INTRAMUSCULAR | Status: AC
  Administered 2015-08-15: 50 ug via INTRAVENOUS
  Filled 2015-08-15: qty 2

## 2015-08-15 MED ORDER — IOHEXOL 300 MG/ML  SOLN
25.0000 mL | INTRAMUSCULAR | Status: AC
Start: 2015-08-15 — End: 2015-08-15
  Administered 2015-08-15 (×2): 25 mL via ORAL

## 2015-08-15 MED ORDER — CALCIUM ACETATE 667 MG PO TABS: 1334.0000 mg | ORAL_TABLET | Freq: Three times a day (TID) | ORAL | Status: DC

## 2015-08-15 MED ORDER — SODIUM CHLORIDE 0.9 % IV BOLUS (SEPSIS)
500.0000 mL | Freq: Once | INTRAVENOUS | Status: AC
  Administered 2015-08-15: 500 mL via INTRAVENOUS

## 2015-08-15 MED ORDER — METRONIDAZOLE IN NACL 5-0.79 MG/ML-% IV SOLN
500.0000 mg | Freq: Three times a day (TID) | INTRAVENOUS | Status: DC
  Administered 2015-08-16: 500 mg via INTRAVENOUS
  Filled 2015-08-15: qty 100

## 2015-08-15 NOTE — ED Notes (Signed)
Dr. Darl Householder at the bedside to update patient.

## 2015-08-15 NOTE — Progress Notes (Signed)
ANTIBIOTIC CONSULT NOTE - INITIAL  Pharmacy Consult for Cipro Indication: Intra-abdominal infection  No Known Allergies  Patient Measurements: Height: 5\' 7"  (170.2 cm) Weight: 247 lb 9.2 oz (112.3 kg) IBW/kg (Calculated) : 61.6  Vital Signs: Temp: 99.1 F (37.3 C) (08/30 2256) Temp Source: Oral (08/30 2256) BP: 113/49 mmHg (08/30 2256) Pulse Rate: 95 (08/30 2256)  Labs:  Recent Labs  08/15/15 1609  WBC 10.3  HGB 12.3  PLT 185  CREATININE 16.24*   Estimated Creatinine Clearance: 5.3 mL/min (by C-G formula based on Cr of 16.24).  Medical History: Past Medical History  Diagnosis Date  . 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 Went back on HD Aug 2007 and remains on HD at [facility] on TTS schedule. Just got back on Tx list as of fall 2015.   Marland Kitchen Rectal bleeding 2007    hemorrhoids/diverticular disease  . Retroperitoneal hematoma 9/07    transfusion  . H/O parathyroidectomy 1999  . UTI (lower urinary tract infection)   . Adrenal insufficiency 2009  . Sleep apnea     does not have cpap at this time  . Oral contraceptive use     patient states has not had menstral cycle in 1 year.  . Hypertension     Dr. Seward Carol [phone]  . Chronic headaches   . H/O syncope   . Breast cancer     RT  . Anemia     hx of  . History of multiple failed HD accesses 09/19/2014    Has had multiple failed HD accesses, including but not limited to LUA AVF, RUA AVG, two LUA HeRO accesses, L thigh AVG, R thigh AVG and two RUA HeRO accesses.  As of fall 2015 her 2nd RUA HeRO occluded and attempt to salvage is pending. Access work is mainly done in [place] with [facility] also involved.    . Chronic hypotension 07/22/2012    Assessment: 49 y/o F with abdominal pain, CT abdomen with possible inflammatory bowel disease, WBC WNL, pt is ESRD on HD, other labs/meds reviewed.   Plan:  -Cipro 400 mg IV q24h -Flagyl  per MD -F/U infectious work-up  Narda Bonds 08/15/2015,11:27 PM

## 2015-08-15 NOTE — ED Notes (Signed)
Unable to gain IV access, second RN to look

## 2015-08-15 NOTE — ED Provider Notes (Signed)
CSN: [id number]     Arrival date & time 08/15/15  1441 History   First MD Initiated Contact with Patient 08/15/15 1710     Chief Complaint  Patient presents with  . Abdominal Pain     (Consider location/radiation/quality/duration/timing/severity/associated sxs/prior Treatment) The history is provided by the patient.  Judy Green is a 49 y.o. female hx of ESRD on HD (last HD was 4 days ago), hypertension here presenting with abdominal pain. Patient had abdominal cramps since yesterday. She was at the beach yesterday and had some much pain so had to come back last night. Pain is intermittent and mostly lower abdominal pain that radiated to her upper abdomen. Denies any vomiting but does have some loose stools. Patient had the symptoms about once a month but last time was evaluated in the ER was about a year ago. She had a CT about a year ago was unremarkable and was admitted at that time. Denies any chest pain or shortness breath.   Past Medical History  Diagnosis Date  . 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 Went back on HD Aug 2007 and remains on HD at [facility] on TTS schedule. Just got back on Tx list as of fall 2015.   Marland Kitchen Rectal bleeding 2007    hemorrhoids/diverticular disease  . Retroperitoneal hematoma 9/07    transfusion  . H/O parathyroidectomy 1999  . UTI (lower urinary tract infection)   . Adrenal insufficiency 2009  . Sleep apnea     does not have cpap at this time  . Oral contraceptive use     patient states has not had menstral cycle in 1 year.  . Hypertension     Dr. Seward Carol [phone]  . Chronic headaches   . H/O syncope   . Breast cancer     RT  . Anemia     hx of  . History of multiple failed HD accesses 09/19/2014    Has had multiple failed HD accesses, including but not limited to LUA AVF, RUA AVG, two LUA HeRO accesses, L thigh AVG, R thigh AVG and two RUA HeRO accesses.   As of fall 2015 her 2nd RUA HeRO occluded and attempt to salvage is pending. Access work is mainly done in [place] with [facility] also involved.    . Chronic hypotension 07/22/2012   Past Surgical History  Procedure Laterality Date  . Av fistula placement Right 05/08/2007  . Hero avg Left 12/14/2008  . Hero  04/17/2009    Removed clotted Left Hero  2) New Right Hero placed and Stent to SVC  . Breast lumpectomy Left   . Exploration renal s/p transplantation    . Tubal ligation    . Avgg removal  08/07/2012    Procedure: REMOVAL OF ARTERIOVENOUS GORETEX GRAFT ([place]);  Surgeon: Rosetta Posner, MD;  Location: [facility] OR;  Service: Vascular;  Laterality: Right;   Family History  Problem Relation Age of Onset  . Diabetes Mother   . Diabetes Maternal Aunt     x 3  . Colon cancer Maternal Uncle   . Heart disease Maternal Grandmother   . Diabetes Maternal Grandmother   . Stroke Maternal Grandmother   . Hypertension Brother   . Hypertension Brother   . Hypertension Brother   . Prostate cancer Maternal Uncle    Social History  Substance Use Topics  . Smoking status:  Former Smoker    Quit date: 12/16/2002  . Smokeless tobacco: Never Used  . Alcohol Use: Yes     Comment: 2-3x/week   OB History    No data available     Review of Systems  Gastrointestinal: Positive for abdominal pain.  All other systems reviewed and are negative.     Allergies  Review of patient's allergies indicates no known allergies.  Home Medications   Prior to Admission medications   Medication Sig Start Date End Date Taking? Authorizing Provider  aspirin EC 325 MG tablet Take 325 mg by mouth daily.   Yes Historical Provider, MD  Calcium Acetate 667 MG TABS Take 1,334-2,001 mg by mouth 3 (three) times daily with meals. Take 2001 mg by mouth 3 times daily with meals. Take 1334 mg by mouth twice daily with snacks or deserts.   Yes Historical Provider, MD  clopidogrel (PLAVIX) 75 MG tablet Take 75 mg by mouth  daily.     Yes Historical Provider, MD  fludrocortisone (FLORINEF) 0.1 MG tablet Take 2 tablets (0.2 mg total) by mouth daily. 12/12/14  Yes Renato Shin, MD  gabapentin (NEURONTIN) 100 MG capsule Take 100 mg by mouth at bedtime.  11/18/13  Yes Historical Provider, MD  midodrine (PROAMATINE) 10 MG tablet Take 1 tablet (10 mg total) by mouth 3 (three) times daily. 09/06/12  Yes Theodis Blaze, MD  norethindrone (MICRONOR,CAMILA,ERRIN) 0.35 MG tablet Take 1 tablet (0.35 mg total) by mouth daily. 11/23/14  Yes Lahoma Crocker, MD  SENSIPAR 30 MG tablet Take 30 mg by mouth daily.  07/08/12  Yes Historical Provider, MD  cyclobenzaprine (FLEXERIL) 5 MG tablet Take 1 tablet (5 mg total) by mouth 3 (three) times daily as needed for muscle spasms. Patient not taking: Reported on 11/23/2014 09/23/14   Delfina Redwood, MD   BP 103/53 mmHg  Pulse 92  Temp(Src) 98.5 F (36.9 C) (Oral)  Resp 22  Ht 5\' 7"  (1.702 m)  Wt 245 lb 3.2 oz (111.222 kg)  BMI 38.39 kg/m2  SpO2 100% Physical Exam  Constitutional: She is oriented to person, place, and time.  Chronically ill   HENT:  Head: Normocephalic.  MM slightly dry   Eyes: Conjunctivae are normal. Pupils are equal, round, and reactive to light.  Neck: Normal range of motion. Neck supple.  Cardiovascular: Normal rate, regular rhythm and normal heart sounds.   Pulmonary/Chest: Effort normal and breath sounds normal. No respiratory distress. She has no wheezes. She has no rales.  Abdominal: Soft. Bowel sounds are normal.  Mild diffuse tenderness, worse in lower abdomen. No rebound   Musculoskeletal: Normal range of motion. She exhibits no edema or tenderness.  Neurological: She is alert and oriented to person, place, and time.  Skin: Skin is warm and dry.  Psychiatric: She has a normal mood and affect. Her behavior is normal. Judgment and thought content normal.  Nursing note and vitals reviewed.   ED Course  Procedures (including critical care  time) Labs Review Labs Reviewed  COMPREHENSIVE METABOLIC PANEL - Abnormal; Notable for the following:    Sodium 134 (*)    Chloride 93 (*)    BUN 68 (*)    Creatinine, Ser 16.24 (*)    Calcium 8.1 (*)    GFR calc non Af Amer 2 (*)    GFR calc Af Amer 3 (*)    Anion gap 19 (*)    All other components within normal limits  CBC WITH DIFFERENTIAL/PLATELET - Abnormal;  Notable for the following:    RBC 3.84 (*)    RDW 16.0 (*)    Neutrophils Relative % 82 (*)    Neutro Abs 8.4 (*)    Lymphocytes Relative 10 (*)    All other components within normal limits  LIPASE, BLOOD  I-STAT TROPOININ, ED    Imaging Review Ct Abdomen Pelvis Wo Contrast  08/15/2015   CLINICAL DATA:  Generalized abdominal pain, chronic but worsening over the past several days  EXAM: CT ABDOMEN AND PELVIS WITHOUT CONTRAST  TECHNIQUE: Multidetector CT imaging of the abdomen and pelvis was performed following the standard protocol without IV contrast.  COMPARISON:  09/30/2014  FINDINGS: Lower chest: Left greater than right patchy dependent bibasilar consolidation is noted. Trace pleural effusions. Early pneumonia could appear similar to atelectasis.  Hepatobiliary: Unenhanced liver and gallbladder appear normal.  Pancreas: Normal  Spleen: Normal  Adrenals/Urinary Tract: Bilateral renal atrophy with low-density renal cortical cysts reidentified. No hydroureteronephrosis. No radiopaque renal or ureteral calculus. The bladder is decompressed.  Stomach/Bowel: There is short segmental terminal ileal wall thickening with mild surrounding stranding and abnormal hypodense thickening of this cecum wall. No pericolonic fluid collection to suggest abscess.  Vascular/Lymphatic: Vascular collaterals are present within the anterior abdominal wall. No aortic aneurysm. No lymphadenopathy. Lower extremity vascular bypass is partly visualized.  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 Uterus and ovaries appear unremarkable.   Musculoskeletal: Bilateral L5 pars interarticularis defects. No acute osseous abnormality.  IMPRESSION: Interval increase in terminal ileal wall thickening with contiguous abnormal cecal mural low density most likely indicating inflammatory bowel disease such as Crohn's disease. Colitis/enteritis could appear similar. The appearance is much less typical for ischemia. Lymphoma could appear similar given the presumed previous history of immunosuppression but is less likely.  Probable dependent bibasilar atelectasis. Early pneumonia could appear similar.   Electronically Signed   By: Conchita Paris M.D.   On: 08/15/2015 21:18   I have personally reviewed and evaluated these images and lab results as part of my medical decision-making.   EKG Interpretation None      MDM   Final diagnoses:  None    Judy Green is a 49 y.o. female here with abdominal pain. Consider colitis vs SBO. Has hx of retroperitoneal hematoma so will get CT to further assess. Moreover, BP around 90 systolic but patient states that this is baseline. Will get labs, CT ab/pel. Will give pain meds and reassess.   9:26 PM CT showed colitis, possible enteritis. Consider Crohn's but patient had colonoscopy a year ago that showed no obvious IBD. Still has pain. Given cipro/flagyl. Will admit.   Wandra Arthurs, MD 08/15/15 [phone]

## 2015-08-15 NOTE — H&P (Signed)
[facility] History and Physical  Judy Green ZOX:[id number] DOB: Jul 09, 1966 DOA: 08/15/2015  Referring physician: Dr. Darl Householder. PCP: Kandice Hams, MD  Specialists: Dr. Ardis Hughs. Gastroenterologist.  Chief Complaint: Abdominal pain.  HPI: Judy Green is a 49 y.o. female with history of ESRD, failed transplant, chronic hypotension presents to the ER because of abdominal pain. Patient had recently traveled to [facility] on the weekend on Saturday after dialysis and since Sunday patient has been having lower abdominal pain which was cramping in nature with diarrhea. Patient's pain became more diffuse and severe. Patient denies any nausea vomiting. Denies any sick contacts or any recent use of antibiotics. Patient states her stools have become darker also. In the ER CT scan of the abdomen and pelvis done shows features concerning for endometrial changes in the terminal ileum and cecum. There was also concerning features for possible pneumonia also but patient denies any cough or phlegm. Patient has been admitted for colitis. Patient still has significant pain and has received fentanyl for pain. Patient has had colonoscopy in March 2015 which showed adenomatous polyps.   Review of Systems: As presented in the history of presenting illness, rest negative.  Past Medical History  Diagnosis Date  . 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 Went back on HD Aug 2007 and remains on HD at [facility] on TTS schedule. Just got back on Tx list as of fall 2015.   Marland Kitchen Rectal bleeding 2007    hemorrhoids/diverticular disease  . Retroperitoneal hematoma 9/07    transfusion  . H/O parathyroidectomy 1999  . UTI (lower urinary tract infection)   . Adrenal insufficiency 2009  . Sleep apnea     does not have cpap at this time  . Oral contraceptive use     patient states has not had menstral cycle in 1 year.  . Hypertension      Dr. Seward Carol [phone]  . Chronic headaches   . H/O syncope   . Breast cancer     RT  . Anemia     hx of  . History of multiple failed HD accesses 09/19/2014    Has had multiple failed HD accesses, including but not limited to LUA AVF, RUA AVG, two LUA HeRO accesses, L thigh AVG, R thigh AVG and two RUA HeRO accesses.  As of fall 2015 her 2nd RUA HeRO occluded and attempt to salvage is pending. Access work is mainly done in [place] with [facility] also involved.    . Chronic hypotension 07/22/2012   Past Surgical History  Procedure Laterality Date  . Av fistula placement Right 05/08/2007  . Hero avg Left 12/14/2008  . Hero  04/17/2009    Removed clotted Left Hero  2) New Right Hero placed and Stent to SVC  . Breast lumpectomy Left   . Exploration renal s/p transplantation    . Tubal ligation    . Avgg removal  08/07/2012    Procedure: REMOVAL OF ARTERIOVENOUS GORETEX GRAFT ([place]);  Surgeon: Rosetta Posner, MD;  Location: [place];  Service: Vascular;  Laterality: Right;   Social History:  reports that she quit smoking about 12 years ago. She has never used smokeless tobacco. She reports that she drinks alcohol. She reports that she does not use illicit drugs. Where does patient live home. Can patient participate in ADLs? Yes.  No Known Allergies  Family History:  Family  History  Problem Relation Age of Onset  . Diabetes Mother   . Diabetes Maternal Aunt     x 3  . Colon cancer Maternal Uncle   . Heart disease Maternal Grandmother   . Diabetes Maternal Grandmother   . Stroke Maternal Grandmother   . Hypertension Brother   . Hypertension Brother   . Hypertension Brother   . Prostate cancer Maternal Uncle       Prior to Admission medications   Medication Sig Start Date End Date Taking? Authorizing Provider  aspirin EC 325 MG tablet Take 325 mg by mouth daily.   Yes Historical Provider, MD  Calcium Acetate 667 MG TABS Take 1,334-2,001 mg by mouth 3 (three) times daily  with meals. Take 2001 mg by mouth 3 times daily with meals. Take 1334 mg by mouth twice daily with snacks or deserts.   Yes Historical Provider, MD  clopidogrel (PLAVIX) 75 MG tablet Take 75 mg by mouth daily.     Yes Historical Provider, MD  fludrocortisone (FLORINEF) 0.1 MG tablet Take 2 tablets (0.2 mg total) by mouth daily. 12/12/14  Yes Renato Shin, MD  gabapentin (NEURONTIN) 100 MG capsule Take 100 mg by mouth at bedtime.  11/18/13  Yes Historical Provider, MD  midodrine (PROAMATINE) 10 MG tablet Take 1 tablet (10 mg total) by mouth 3 (three) times daily. 09/06/12  Yes Theodis Blaze, MD  norethindrone (MICRONOR,CAMILA,ERRIN) 0.35 MG tablet Take 1 tablet (0.35 mg total) by mouth daily. 11/23/14  Yes Lahoma Crocker, MD  SENSIPAR 30 MG tablet Take 30 mg by mouth daily.  07/08/12  Yes Historical Provider, MD  cyclobenzaprine (FLEXERIL) 5 MG tablet Take 1 tablet (5 mg total) by mouth 3 (three) times daily as needed for muscle spasms. Patient not taking: Reported on 11/23/2014 09/23/14   Delfina Redwood, MD    Physical Exam: Filed Vitals:   08/15/15 2000 08/15/15 2128 08/15/15 2224 08/15/15 2256  BP: 103/53 111/52 103/54 113/49  Pulse: 92 95 91 95  Temp:    99.1 F (37.3 C)  TempSrc:    Oral  Resp: 22  20 17   Height:    5\' 7"  (1.702 m)  Weight:    112.3 kg (247 lb 9.2 oz)  SpO2: 100% 97% 100% 98%     General: Moderately built and nourished.  Eyes: Anicteric. No pallor.  ENT: No discharge from the ears eyes nose and mouth.  Neck: No mass felt. No neck rigidity.  Cardiovascular: S1 and S2 heard.  Respiratory: No rhonchi or crepitations.  Abdomen: Soft but mildly tender diffusely. No guarding or rigidity.  Skin: No rash.  Musculoskeletal: No edema.  Psychiatric: Appears normal.  Neurologic: Alert awake oriented to time place and person. Moves all extremities.  Labs on Admission:  Basic Metabolic Panel:  Recent Labs Lab 08/15/15 1609  NA 134*  K 4.1  CL 93*  CO2  22  GLUCOSE 86  BUN 68*  CREATININE 16.24*  CALCIUM 8.1*   Liver Function Tests:  Recent Labs Lab 08/15/15 1609  AST 24  ALT 17  ALKPHOS 106  BILITOT 1.2  PROT 7.2  ALBUMIN 3.8    Recent Labs Lab 08/15/15 1609  LIPASE 23   No results for input(s): AMMONIA in the last 168 hours. CBC:  Recent Labs Lab 08/15/15 1609  WBC 10.3  NEUTROABS 8.4*  HGB 12.3  HCT 37.7  MCV 98.2  PLT 185   Cardiac Enzymes: No results for input(s): CKTOTAL, CKMB, CKMBINDEX, TROPONINI  in the last 168 hours.  BNP (last 3 results) No results for input(s): BNP in the last 8760 hours.  ProBNP (last 3 results) No results for input(s): PROBNP in the last 8760 hours.  CBG: No results for input(s): GLUCAP in the last 168 hours.  Radiological Exams on Admission: Ct Abdomen Pelvis Wo Contrast  08/15/2015   CLINICAL DATA:  Generalized abdominal pain, chronic but worsening over the past several days  EXAM: CT ABDOMEN AND PELVIS WITHOUT CONTRAST  TECHNIQUE: Multidetector CT imaging of the abdomen and pelvis was performed following the standard protocol without IV contrast.  COMPARISON:  09/30/2014  FINDINGS: Lower chest: Left greater than right patchy dependent bibasilar consolidation is noted. Trace pleural effusions. Early pneumonia could appear similar to atelectasis.  Hepatobiliary: Unenhanced liver and gallbladder appear normal.  Pancreas: Normal  Spleen: Normal  Adrenals/Urinary Tract: Bilateral renal atrophy with low-density renal cortical cysts reidentified. No hydroureteronephrosis. No radiopaque renal or ureteral calculus. The bladder is decompressed.  Stomach/Bowel: There is short segmental terminal ileal wall thickening with mild surrounding stranding and abnormal hypodense thickening of this cecum wall. No pericolonic fluid collection to suggest abscess.  Vascular/Lymphatic: Vascular collaterals are present within the anterior abdominal wall. No aortic aneurysm. No lymphadenopathy. Lower  extremity vascular bypass is partly visualized.  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 Uterus and ovaries appear unremarkable.  Musculoskeletal: Bilateral L5 pars interarticularis defects. No acute osseous abnormality.  IMPRESSION: Interval increase in terminal ileal wall thickening with contiguous abnormal cecal mural low density most likely indicating inflammatory bowel disease such as Crohn's disease. Colitis/enteritis could appear similar. The appearance is much less typical for ischemia. Lymphoma could appear similar given the presumed previous history of immunosuppression but is less likely.  Probable dependent bibasilar atelectasis. Early pneumonia could appear similar.   Electronically Signed   By: Conchita Paris M.D.   On: 08/15/2015 21:18     Assessment/Plan Principal Problem:   Colitis Active Problems:   ESRD on hemodialysis   Chronic hypotension   1. Colitis - prominently involving the cecum and the terminal ileal area. Cause not clear could be inflammatory (? Crohn's) versus infectious. Patient has been placed on empiric antibiotics. Since patient also has had some diarrhea will check stool studies. Patient denies having eaten any raw meat or fish or having any sick contacts or used antibiotics. Check lactic acid levels. For now and Patient nothing by mouth except medications. The patient's pain improves and may try diet in the morning. 2. Possible pneumonia - patient has been placed on empiric antibiotics for healthcare associated pneumonia. Closely monitor. 3. Chronic hypotension - patient is on fludrocortisone and Midodrine. Patient did receive 500 mL bolus in the ER. Closely observe. 4. ESRD on hemodialysis on Tuesday Thursday and Saturday - consult nephrology for dialysis. 5. History of failed renal transplant. 6. History of anemia - follow CBC.  I have reviewed patient's old charts of labs.   DVT Prophylaxis SCDs for now.  Code Status: Full code.   Family Communication: Discussed with patient's husband.  Disposition Plan: Admit to inpatient.    Lexii Walsh N. [facility] Pager [phone].  If 7PM-7AM, please contact night-coverage [email or web address] Password [email or web address] 08/15/2015, 11:11 PM

## 2015-08-15 NOTE — ED Notes (Signed)
Pt c/o generalized abd pain that is chronic in nature worse last couple of days; pt denies N/V; pt dialysis pt was due today but is now on schedule for tomorrow

## 2015-08-15 NOTE — ED Notes (Signed)
Pt to CT

## 2015-08-16 DIAGNOSIS — R933 Abnormal findings on diagnostic imaging of other parts of digestive tract: Secondary | ICD-10-CM

## 2015-08-16 DIAGNOSIS — R1084 Generalized abdominal pain: Secondary | ICD-10-CM

## 2015-08-16 DIAGNOSIS — D638 Anemia in other chronic diseases classified elsewhere: Secondary | ICD-10-CM

## 2015-08-16 DIAGNOSIS — K529 Noninfective gastroenteritis and colitis, unspecified: Secondary | ICD-10-CM

## 2015-08-16 LAB — GLUCOSE, CAPILLARY
GLUCOSE-CAPILLARY: 85 mg/dL (ref 65–99)
GLUCOSE-CAPILLARY: 96 mg/dL (ref 65–99)
GLUCOSE-CAPILLARY: 53 mg/dL — AB (ref 65–99)
GLUCOSE-CAPILLARY: 118 mg/dL — AB (ref 65–99)
Glucose-Capillary: 85 mg/dL (ref 65–99)
Glucose-Capillary: 94 mg/dL (ref 65–99)

## 2015-08-16 LAB — COMPREHENSIVE METABOLIC PANEL
ALK PHOS: 94 U/L (ref 38–126)
ALT: 16 U/L (ref 14–54)
AST: 16 U/L (ref 15–41)
Albumin: 3.3 g/dL — ABNORMAL LOW (ref 3.5–5.0)
Anion gap: 19 — ABNORMAL HIGH (ref 5–15)
BILIRUBIN TOTAL: 1 mg/dL (ref 0.3–1.2)
BUN: 73 mg/dL — ABNORMAL HIGH (ref 6–20)
CALCIUM: 7.7 mg/dL — AB (ref 8.9–10.3)
CO2: 20 mmol/L — ABNORMAL LOW (ref 22–32)
CREATININE: 16.75 mg/dL — AB (ref 0.44–1.00)
Chloride: 91 mmol/L — ABNORMAL LOW (ref 101–111)
GFR, EST AFRICAN AMERICAN: 2 mL/min — AB (ref >60)
GFR, EST NON AFRICAN AMERICAN: 2 mL/min — AB (ref >60)
Glucose, Bld: 88 mg/dL (ref 65–99)
Potassium: 4.2 mmol/L (ref 3.5–5.1)
Sodium: 130 mmol/L — ABNORMAL LOW (ref 135–145)
TOTAL PROTEIN: 6.5 g/dL (ref 6.5–8.1)

## 2015-08-16 LAB — CBC WITH DIFFERENTIAL/PLATELET
BASOS ABS: 0 10*3/uL (ref 0.0–0.1)
BASOS PCT: 0 % (ref 0–1)
EOS ABS: 0.3 10*3/uL (ref 0.0–0.7)
Eosinophils Relative: 3 % (ref 0–5)
HCT: 32.9 % — ABNORMAL LOW (ref 36.0–46.0)
HEMOGLOBIN: 10.7 g/dL — AB (ref 12.0–15.0)
Lymphocytes Relative: 12 % (ref 12–46)
Lymphs Abs: 1.2 10*3/uL (ref 0.7–4.0)
MCH: 31.5 pg (ref 26.0–34.0)
MCHC: 32.5 g/dL (ref 30.0–36.0)
MCV: 96.8 fL (ref 78.0–100.0)
MONOS PCT: 8 % (ref 3–12)
Monocytes Absolute: 0.8 10*3/uL (ref 0.1–1.0)
NEUTROS PCT: 77 % (ref 43–77)
Neutro Abs: 7.9 10*3/uL — ABNORMAL HIGH (ref 1.7–7.7)
Platelets: 172 10*3/uL (ref 150–400)
RBC: 3.4 MIL/uL — AB (ref 3.87–5.11)
RDW: 15.7 % — ABNORMAL HIGH (ref 11.5–15.5)
WBC: 10.2 10*3/uL (ref 4.0–10.5)

## 2015-08-16 LAB — CBC
HCT: 31.1 % — ABNORMAL LOW (ref 36.0–46.0)
Hemoglobin: 10.3 g/dL — ABNORMAL LOW (ref 12.0–15.0)
MCH: 31.5 pg (ref 26.0–34.0)
MCHC: 33.1 g/dL (ref 30.0–36.0)
MCV: 95.1 fL (ref 78.0–100.0)
Platelets: 210 10*3/uL (ref 150–400)
RBC: 3.27 MIL/uL — ABNORMAL LOW (ref 3.87–5.11)
RDW: 15.4 % (ref 11.5–15.5)
WBC: 9 10*3/uL (ref 4.0–10.5)

## 2015-08-16 LAB — RENAL FUNCTION PANEL
Albumin: 3.1 g/dL — ABNORMAL LOW (ref 3.5–5.0)
Anion gap: 17 — ABNORMAL HIGH (ref 5–15)
BUN: 80 mg/dL — ABNORMAL HIGH (ref 6–20)
CO2: 20 mmol/L — ABNORMAL LOW (ref 22–32)
Calcium: 7.7 mg/dL — ABNORMAL LOW (ref 8.9–10.3)
Chloride: 95 mmol/L — ABNORMAL LOW (ref 101–111)
Creatinine, Ser: 17.49 mg/dL — ABNORMAL HIGH (ref 0.44–1.00)
GFR calc Af Amer: 2 mL/min — ABNORMAL LOW (ref >60)
GFR calc non Af Amer: 2 mL/min — ABNORMAL LOW (ref >60)
Glucose, Bld: 83 mg/dL (ref 65–99)
Phosphorus: 7.6 mg/dL — ABNORMAL HIGH (ref 2.5–4.6)
Potassium: 4.6 mmol/L (ref 3.5–5.1)
Sodium: 132 mmol/L — ABNORMAL LOW (ref 135–145)

## 2015-08-16 LAB — LACTIC ACID, PLASMA
Lactic Acid, Venous: 1.2 mmol/L (ref 0.5–2.0)
Lactic Acid, Venous: 1 mmol/L (ref 0.5–2.0)

## 2015-08-16 LAB — C DIFFICILE QUICK SCREEN W PCR REFLEX
C DIFFICILE (CDIFF) INTERP: NEGATIVE
C DIFFICILE (CDIFF) TOXIN: NEGATIVE
C DIFFICLE (CDIFF) ANTIGEN: NEGATIVE

## 2015-08-16 MED ORDER — VANCOMYCIN HCL IN DEXTROSE 1-5 GM/200ML-% IV SOLN: 1000.0000 mg | INTRAVENOUS | Status: DC

## 2015-08-16 MED ORDER — LIDOCAINE-PRILOCAINE 2.5-2.5 % EX CREA
1.0000 "application " | TOPICAL_CREAM | CUTANEOUS | Status: DC | PRN
  Filled 2015-08-16: qty 5

## 2015-08-16 MED ORDER — SODIUM CHLORIDE 0.9 % IV SOLN: 100.0000 mL | INTRAVENOUS | Status: DC | PRN

## 2015-08-16 MED ORDER — PIPERACILLIN-TAZOBACTAM IN DEX 2-0.25 GM/50ML IV SOLN
2.2500 g | Freq: Three times a day (TID) | INTRAVENOUS | Status: DC
  Administered 2015-08-16: 2.25 g via INTRAVENOUS
  Filled 2015-08-16 (×2): qty 50

## 2015-08-16 MED ORDER — LIDOCAINE HCL (PF) 1 % IJ SOLN: 5.0000 mL | INTRAMUSCULAR | Status: DC | PRN

## 2015-08-16 MED ORDER — CIPROFLOXACIN IN D5W 400 MG/200ML IV SOLN: 400.0000 mg | INTRAVENOUS | Status: DC

## 2015-08-16 MED ORDER — DARBEPOETIN ALFA 60 MCG/0.3ML IJ SOSY
60.0000 ug | PREFILLED_SYRINGE | INTRAMUSCULAR | Status: DC
  Filled 2015-08-16: qty 0.3

## 2015-08-16 MED ORDER — HEPARIN SODIUM (PORCINE) 1000 UNIT/ML DIALYSIS: 1000.0000 [IU] | INTRAMUSCULAR | Status: DC | PRN

## 2015-08-16 MED ORDER — CALCITRIOL 0.5 MCG PO CAPS
0.7500 ug | ORAL_CAPSULE | ORAL | Status: DC
  Administered 2015-08-18 – 2015-08-20 (×2): 0.75 ug via ORAL
  Filled 2015-08-16 (×2): qty 1

## 2015-08-16 MED ORDER — SODIUM CHLORIDE 0.9 % IV SOLN
125.0000 mg | INTRAVENOUS | Status: DC
  Administered 2015-08-20: 125 mg via INTRAVENOUS
  Filled 2015-08-16 (×2): qty 10

## 2015-08-16 MED ORDER — VANCOMYCIN HCL 10 G IV SOLR
2500.0000 mg | Freq: Once | INTRAVENOUS | Status: AC
  Administered 2015-08-16: 2500 mg via INTRAVENOUS
  Filled 2015-08-16: qty 2500

## 2015-08-16 MED ORDER — ALTEPLASE 2 MG IJ SOLR
2.0000 mg | Freq: Once | INTRAMUSCULAR | Status: DC | PRN
  Filled 2015-08-16: qty 2

## 2015-08-16 MED ORDER — PENTAFLUOROPROP-TETRAFLUOROETH EX AERO: 1.0000 "application " | INHALATION_SPRAY | CUTANEOUS | Status: DC | PRN

## 2015-08-16 MED ORDER — METRONIDAZOLE IN NACL 5-0.79 MG/ML-% IV SOLN
500.0000 mg | Freq: Three times a day (TID) | INTRAVENOUS | Status: DC
  Administered 2015-08-16: 500 mg via INTRAVENOUS
  Filled 2015-08-16: qty 100

## 2015-08-16 MED ORDER — HEPARIN SODIUM (PORCINE) 1000 UNIT/ML DIALYSIS: 20.0000 [IU]/kg | INTRAMUSCULAR | Status: DC | PRN

## 2015-08-16 NOTE — Progress Notes (Signed)
Patient Demographics  Judy Green, is a 49 y.o. female, DOB - [birth year], PIR:[id number]  Admit date - 08/15/2015   Admitting Physician Rise Patience, MD  Outpatient Primary MD for the patient is Kandice Hams, MD  LOS - 1   Chief Complaint  Patient presents with  . Abdominal Pain         Subjective:   Judy Green today has, No headache, No chest pain, No new weakness tingling or numbness, No Cough - SOB, reports some abd pain but improving.  Assessment & Plan    Principal Problem:   Colitis Active Problems:   ESRD on hemodialysis   Chronic hypotension  Abdominal pain and diarrhea - As is most likely related to colitis, CT abdomen and pelvis showing colitis involving cecum and terminal ileal area. - Abdominal pain significantly improved, tolerating clear liquid diet. - GI consult appreciated, plan is for colonoscopy this coming Friday, will hold aspirin and Plavix. - C. difficile negative - Continue IV Cipro and Flagyl  Questionable pneumonia on imaging - CT abdomen showing bibasilar infiltrate/atelectasis, no clinical evidence of pneumonia, will discontinue IV vancomycin and Zosyn.  Chronic hypotension - Continue with fludrocortisone and midodrine  End-stage renal disease - Nephrology consulted  History of failed renal transplant  Anemia -  related to renal disease Code Status: Full  Family Communication: Discussed with patient, none at bedside  Disposition Plan: Home when stable   Procedures  none   Consults   Renal GI   Medications  Scheduled Meds: . calcium acetate  2,001 mg Oral TID WC  . enoxaparin (LOVENOX) injection  30 mg Subcutaneous Q24H  . fludrocortisone  0.2 mg Oral Daily  . gabapentin  100 mg Oral QHS  . metronidazole  500 mg Intravenous Q8H  . midodrine  10 mg Oral TID WC  . sodium chloride  3 mL Intravenous Q12H    Continuous Infusions:  PRN Meds:.acetaminophen **OR** acetaminophen, calcium acetate, fentaNYL (SUBLIMAZE) injection, ondansetron **OR** ondansetron (ZOFRAN) IV  DVT Prophylaxis   Heparin -  Lab Results  Component Value Date   PLT 172 08/16/2015    Antibiotics    Anti-infectives    Start     Dose/Rate Route Frequency Ordered Stop   08/17/15 1200  vancomycin (VANCOCIN) IVPB 1000 mg/200 mL premix  Status:  Discontinued     1,000 mg 200 mL/hr over 60 Minutes Intravenous Every T-Th-Sa (Hemodialysis) 08/16/15 0646 08/16/15 1220   08/16/15 2200  ciprofloxacin (CIPRO) IVPB 400 mg  Status:  Discontinued     400 mg 200 mL/hr over 60 Minutes Intravenous Every 24 hours 08/15/15 2329 08/16/15 0825   08/16/15 1230  metroNIDAZOLE (FLAGYL) IVPB 500 mg     500 mg 100 mL/hr over 60 Minutes Intravenous Every 8 hours 08/16/15 1221     08/16/15 0800  piperacillin-tazobactam (ZOSYN) IVPB 2.25 g  Status:  Discontinued     2.25 g 100 mL/hr over 30 Minutes Intravenous Every 8 hours 08/16/15 0645 08/16/15 1220   08/16/15 0700  vancomycin (VANCOCIN) 2,500 mg in sodium chloride 0.9 % 500 mL IVPB     2,500 mg 250 mL/hr over 120 Minutes Intravenous  Once 08/16/15 0645 08/16/15 0941   08/16/15 0600  metroNIDAZOLE (FLAGYL) IVPB 500  mg  Status:  Discontinued     500 mg 100 mL/hr over 60 Minutes Intravenous Every 8 hours 08/15/15 2310 08/16/15 0636   08/15/15 2130  ciprofloxacin (CIPRO) IVPB 400 mg     400 mg 200 mL/hr over 60 Minutes Intravenous  Once 08/15/15 2123 08/15/15 2317   08/15/15 2130  metroNIDAZOLE (FLAGYL) IVPB 500 mg     500 mg 100 mL/hr over 60 Minutes Intravenous  Once 08/15/15 2123 08/15/15 2317          Objective:   Filed Vitals:   08/15/15 2224 08/15/15 2256 08/16/15 0413 08/16/15 0927  BP: 103/54 113/49 102/53 104/68  Pulse: 91 95 97 77  Temp:  99.1 F (37.3 C) 99 F (37.2 C) 98.5 F (36.9 C)  TempSrc:  Oral Oral Oral  Resp: 20 17 20 18   Height:  5\' 7"  (1.702 m)     Weight:  112.3 kg (247 lb 9.2 oz)    SpO2: 100% 98% 100% 97%    Wt Readings from Last 3 Encounters:  08/15/15 112.3 kg (247 lb 9.2 oz)  09/23/14 112 kg (246 lb 14.6 oz)  09/22/14 113.5 kg (250 lb 3.6 oz)     Intake/Output Summary (Last 24 hours) at 08/16/15 1222 Last data filed at 08/16/15 0923  Gross per 24 hour  Intake    500 ml  Output    100 ml  Net    400 ml     Physical Exam  Awake Alert, Oriented X 3, No new F.N deficits, Normal affect [place].AT,PERRAL Supple Neck,No JVD, No cervical lymphadenopathy appriciated.  Symmetrical Chest wall movement, Good air movement bilaterally, CTAB RRR,No Gallops,Rubs or new Murmurs, No Parasternal Heave +ve B.Sounds, Abd Soft, minimal tenderness, No organomegaly appriciated. No Cyanosis, Clubbing or edema, No new Rash or bruise     Data Review   Micro Results Recent Results (from the past 240 hour(s))  C difficile quick scan w PCR reflex     Status: None   Collection Time: 08/15/15 11:14 PM  Result Value Ref Range Status   C Diff antigen NEGATIVE NEGATIVE Final   C Diff toxin NEGATIVE NEGATIVE Final   C Diff interpretation Negative for toxigenic C. difficile  Final    Radiology Reports Ct Abdomen Pelvis Wo Contrast  08/15/2015   CLINICAL DATA:  Generalized abdominal pain, chronic but worsening over the past several days  EXAM: CT ABDOMEN AND PELVIS WITHOUT CONTRAST  TECHNIQUE: Multidetector CT imaging of the abdomen and pelvis was performed following the standard protocol without IV contrast.  COMPARISON:  09/30/2014  FINDINGS: Lower chest: Left greater than right patchy dependent bibasilar consolidation is noted. Trace pleural effusions. Early pneumonia could appear similar to atelectasis.  Hepatobiliary: Unenhanced liver and gallbladder appear normal.  Pancreas: Normal  Spleen: Normal  Adrenals/Urinary Tract: Bilateral renal atrophy with low-density renal cortical cysts reidentified. No hydroureteronephrosis. No radiopaque renal or  ureteral calculus. The bladder is decompressed.  Stomach/Bowel: There is short segmental terminal ileal wall thickening with mild surrounding stranding and abnormal hypodense thickening of this cecum wall. No pericolonic fluid collection to suggest abscess.  Vascular/Lymphatic: Vascular collaterals are present within the anterior abdominal wall. No aortic aneurysm. No lymphadenopathy. Lower extremity vascular bypass is partly visualized.  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 Uterus and ovaries appear unremarkable.  Musculoskeletal: Bilateral L5 pars interarticularis defects. No acute osseous abnormality.  IMPRESSION: Interval increase in terminal ileal wall thickening with contiguous abnormal cecal mural low density most likely indicating  inflammatory bowel disease such as Crohn's disease. Colitis/enteritis could appear similar. The appearance is much less typical for ischemia. Lymphoma could appear similar given the presumed previous history of immunosuppression but is less likely.  Probable dependent bibasilar atelectasis. Early pneumonia could appear similar.   Electronically Signed   By: Conchita Paris M.D.   On: 08/15/2015 21:18     CBC  Recent Labs Lab 08/15/15 1609 08/16/15 0241  WBC 10.3 10.2  HGB 12.3 10.7*  HCT 37.7 32.9*  PLT 185 172  MCV 98.2 96.8  MCH 32.0 31.5  MCHC 32.6 32.5  RDW 16.0* 15.7*  LYMPHSABS 1.0 1.2  MONOABS 0.7 0.8  EOSABS 0.2 0.3  BASOSABS 0.0 0.0    Chemistries   Recent Labs Lab 08/15/15 1609 08/16/15 0241  NA 134* 130*  K 4.1 4.2  CL 93* 91*  CO2 22 20*  GLUCOSE 86 88  BUN 68* 73*  CREATININE 16.24* 16.75*  CALCIUM 8.1* 7.7*  AST 24 16  ALT 17 16  ALKPHOS 106 94  BILITOT 1.2 1.0   ------------------------------------------------------------------------------------------------------------------ estimated creatinine clearance is 5.1 mL/min (by C-G formula based on Cr of  16.75). ------------------------------------------------------------------------------------------------------------------ No results for input(s): HGBA1C in the last 72 hours. ------------------------------------------------------------------------------------------------------------------ No results for input(s): CHOL, HDL, LDLCALC, TRIG, CHOLHDL, LDLDIRECT in the last 72 hours. ------------------------------------------------------------------------------------------------------------------ No results for input(s): TSH, T4TOTAL, T3FREE, THYROIDAB in the last 72 hours.  Invalid input(s): FREET3 ------------------------------------------------------------------------------------------------------------------ No results for input(s): VITAMINB12, FOLATE, FERRITIN, TIBC, IRON, RETICCTPCT in the last 72 hours.  Coagulation profile No results for input(s): INR, PROTIME in the last 168 hours.  No results for input(s): DDIMER in the last 72 hours.  Cardiac Enzymes No results for input(s): CKMB, TROPONINI, MYOGLOBIN in the last 168 hours.  Invalid input(s): CK ------------------------------------------------------------------------------------------------------------------ Invalid input(s): POCBNP     Time Spent in minutes   30 minutes    Luiscarlos Kaczmarczyk M.D on 08/16/2015 at 12:22 PM  Between 7am to 7pm - Pager - [phone]  After 7pm go to [email or web address] - password [facility]  [facility]   Office  [phone]

## 2015-08-16 NOTE — Progress Notes (Signed)
ANTIBIOTIC CONSULT NOTE  Pharmacy Consult for Ciprofloxacin Indication: Intra-abdominal infection  No Known Allergies  Patient Measurements: Height: 5\' 7"  (170.2 cm) Weight: 247 lb 9.2 oz (112.3 kg) IBW/kg (Calculated) : 61.6  Vital Signs: Temp: 98.5 F (36.9 C) (08/31 0927) Temp Source: Oral (08/31 0927) BP: 104/68 mmHg (08/31 0927) Pulse Rate: 77 (08/31 0927)  Labs:  Recent Labs  08/15/15 1609 08/16/15 0241  WBC 10.3 10.2  HGB 12.3 10.7*  PLT 185 172  CREATININE 16.24* 16.75*   Estimated Creatinine Clearance: 5.1 mL/min (by C-G formula based on Cr of 16.75).   Assessment: 49 y/o F with abdominal pain, CT abdomen with possible inflammatory bowel disease, also showed incidental possible PNA. Was briefly changed to vancomycin and Zosyn to cover HCAP, now to resume ciprofloxacin and Flagyl for intra-abdominal infection. ESRD on HD TTSat.  WBC nml, afebrile.  Plan:  -Cipro 400 mg IV q24h -Flagyl 500mg  IV q8h per MD (correct dosing)  Pharmacy will sign off as no dose adjustments will be required in HD patient. Will follow peripherally. Please reconsult if needed.   Daulton Harbaugh D. Jaymes Hang, PharmD, BCPS Clinical Pharmacist Pager: [phone] 08/16/2015 12:59 PM

## 2015-08-16 NOTE — Consult Note (Signed)
[facility] Consult: 9:11 AM 08/16/2015  LOS: 1 day    Referring Provider: Dr Waldron Labs  Primary Care Physician:  Kandice Hams, MD Primary Gastroenterologist:  Dr. Ardis Hughs    Reason for Consultation:  Diarrhea, abd pain.    HPI: Judy Green is a 49 y.o. female.  Hx ESRD (TTS dialysis), failed renal transplant.  Chronic hypotension. Anemia of renal failure/chronic disease. Chronic Plavix (last dose 8/29) for problems with dialysis access/vascular clotting.  02/2014 Colonoscopy: for rectal bleeding, abd pain, constipation. Sessile polyp at IC valve (tubular adenoma), int/ext rrhoids.   Has had periodic abdominal pain over 2 to 3 years, treated in ED and no admissions required.  Had routine, uneventful HD on Saturday, Sunday developed pain in abdomen, began in lower mid region but spread across entire abdomen, no radiation to chest or back.  Stool was large, loose, black.  No n/v, fever, chest pain Due to ongoing pain, refractory to Tylenol, she came to ED CT shows inflammation of TI, increased c/w 09/2014 (no mention of inflammation on that CT however). Suspicious for IBD/Chrohns. Started on cipro/flagyl/vanc.  WBCs not elevated.  No fever.     Past Medical History  Diagnosis Date  . 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 Went back on HD Aug 2007 and remains on HD at [facility] on TTS schedule. Just got back on Tx list as of fall 2015.   Marland Kitchen Rectal bleeding 2007    hemorrhoids/diverticular disease  . Retroperitoneal hematoma 9/07    transfusion  . H/O parathyroidectomy 1999  . UTI (lower urinary tract infection)   . Adrenal insufficiency 2009  . Sleep apnea     does not have cpap at this time  . Oral contraceptive use    patient states has not had menstral cycle in 1 year.  . Hypertension     Dr. Seward Carol [phone]  . Chronic headaches   . H/O syncope   . Breast cancer     RT  . Anemia     hx of  . History of multiple failed HD accesses 09/19/2014    Has had multiple failed HD accesses, including but not limited to LUA AVF, RUA AVG, two LUA HeRO accesses, L thigh AVG, R thigh AVG and two RUA HeRO accesses.  As of fall 2015 her 2nd RUA HeRO occluded and attempt to salvage is pending. Access work is mainly done in [place] with [facility] also involved.    . Chronic hypotension 07/22/2012    Past Surgical History  Procedure Laterality Date  . Av fistula placement Right 05/08/2007  . Hero avg Left 12/14/2008  . Hero  04/17/2009    Removed clotted Left Hero  2) New Right Hero placed and Stent to SVC  . Breast lumpectomy Left   . Exploration renal s/p transplantation    . Tubal ligation    . Avgg removal  08/07/2012    Procedure: REMOVAL OF ARTERIOVENOUS GORETEX GRAFT ([place]);  Surgeon: Rosetta Posner, MD;  Location: [facility];  Service: Vascular;  Laterality: Right;    Prior to Admission medications   Medication Sig Start Date End Date Taking? Authorizing Provider  aspirin EC 325 MG tablet Take 325 mg by mouth daily.   Yes Historical Provider, MD  Calcium Acetate 667 MG TABS Take 1,334-2,001 mg by mouth 3 (three) times daily with meals. Take 2001 mg by mouth 3 times daily with meals. Take 1334 mg by mouth twice daily with snacks or deserts.   Yes Historical Provider, MD  clopidogrel (PLAVIX) 75 MG tablet Take 75 mg by mouth daily.     Yes Historical Provider, MD  fludrocortisone (FLORINEF) 0.1 MG tablet Take 2 tablets (0.2 mg total) by mouth daily. 12/12/14  Yes Renato Shin, MD  gabapentin (NEURONTIN) 100 MG capsule Take 100 mg by mouth at bedtime.  11/18/13  Yes Historical Provider, MD  midodrine (PROAMATINE) 10 MG tablet Take 1 tablet (10 mg total) by mouth 3 (three) times daily. 09/06/12  Yes Theodis Blaze, MD  norethindrone (MICRONOR,CAMILA,ERRIN) 0.35 MG tablet Take 1 tablet (0.35 mg total) by mouth daily. 11/23/14  Yes Lahoma Crocker, MD  SENSIPAR 30 MG tablet Take 30 mg by mouth daily.  07/08/12  Yes Historical Provider, MD  cyclobenzaprine (FLEXERIL) 5 MG tablet Take 1 tablet (5 mg total) by mouth 3 (three) times daily as needed for muscle spasms. Patient not taking: Reported on 11/23/2014 09/23/14   Delfina Redwood, MD    Scheduled Meds: . aspirin EC  325 mg Oral Daily  . calcium acetate  2,001 mg Oral TID WC  . clopidogrel  75 mg Oral Daily  . enoxaparin (LOVENOX) injection  30 mg Subcutaneous Q24H  . fludrocortisone  0.2 mg Oral Daily  . gabapentin  100 mg Oral QHS  . midodrine  10 mg Oral TID WC  . piperacillin-tazobactam (ZOSYN)  IV  2.25 g Intravenous Q8H  . sodium chloride  3 mL Intravenous Q12H  . vancomycin  2,500 mg Intravenous Once  . [START ON 08/17/2015] vancomycin  1,000 mg Intravenous Q T,Th,Sa-HD   Infusions:   PRN Meds: acetaminophen **OR** acetaminophen, calcium acetate, fentaNYL (SUBLIMAZE) injection, ondansetron **OR** ondansetron (ZOFRAN) IV   Allergies as of 08/15/2015  . (No Known Allergies)    Family History  Problem Relation Age of Onset  . Diabetes Mother   . Diabetes Maternal Aunt     x 3  . Colon cancer Maternal Uncle   . Heart disease Maternal Grandmother   . Diabetes Maternal Grandmother   . Stroke Maternal Grandmother   . Hypertension Brother   . Hypertension Brother   . Hypertension Brother   . Prostate cancer Maternal Uncle         Hemorrhoids                                    Brother  Social History   Social History  . Marital Status: Married    Spouse Name: N/A  . Number of Children: 2  . Years of Education: N/A   Occupational History  . 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 Smoking status: Former Smoker    Quit date: 12/16/2002  . Smokeless tobacco: Never Used  . Alcohol Use: Yes      Comment: 2-3x/week  . Drug Use: No  . Sexual Activity: Not Currently  Other Topics Concern  . Not on file   Social History Narrative   Regular exercise-yes   Caffeine Use-yes          REVIEW OF SYSTEMS: Constitutional:  Stable weight.  Generally fair level of energy ENT:  No nose bleeds Pulm:  No SOB or cough CV:  No palpitations, no LE edema.  GU:  Anuric.  GI:  Per HPI.  No heartburn, no dysphagia Gyn.  On birth control pills for hot flashes.  Heme:  Periodic parenteral iron infusions, these tend to cause constipation. Last infusion was a few weeks ago Transfusions:  none Neuro:  No headaches, no peripheral tingling or numbness Derm:  Has scalp itching related to hot flashes/sweating.   Endocrine:  No sweats or chills.  No polyuria or dysuria Immunization:  Previous Hepatitis vaccination  Travel:  None beyond local counties in last few months.    PHYSICAL EXAM: Vital signs in last 24 hours: Filed Vitals:   08/16/15 0413  BP: 102/53  Pulse: 97  Temp: 99 F (37.2 C)  Resp: 20   Wt Readings from Last 3 Encounters:  08/15/15 247 lb 9.2 oz (112.3 kg)  09/23/14 246 lb 14.6 oz (112 kg)  09/22/14 250 lb 3.6 oz (113.5 kg)   General: pleasant, comfortable, intelligent. Does not look ill Head:  No asymmetry.  Slightly cushingoid Eyes:  No icterus or pallor Ears:  Not HOH Nose:  No congestion or discharge Mouth:  Clear, moist MM.  Upper partial plate.  Not wearing lower partial plate so many teeth missing Neck:  No jvd, mass or tmg Lungs:  Clear bil.  Nocough or dyspnea.   Heart: RRR.  No mrg.  S1/s2 audible Abdomen:  Soft, difusely tender but no guard or rebound.  BS present, no tinkling or tympanitic BS.  No HSM.  Do not palpate transplanted kidney Rectal: scant, brown stool smear, is FOBT negative.  No mass Musc/Skeltl: no joint contracture, swelling or deformity Extremities:  No CCE Neurologic:  Oriented x 3.  Moves all 4.  Full limb strength.  No tremor or  gross deficits Skin:  No rash, sores or telangectasia Tattoos:  none Nodes:  No cervical adenopathy.  Psych:  Pleasant, relaxed,   Intake/Output from previous day: 08/30 0701 - 08/31 0700 In: 500 [I.V.:500] Out: 100 [Stool:100] Intake/Output this shift:    LAB RESULTS:  Recent Labs  08/15/15 1609 08/16/15 0241  WBC 10.3 10.2  HGB 12.3 10.7*  HCT 37.7 32.9*  PLT 185 172   BMET Lab Results  Component Value Date   NA 130* 08/16/2015   NA 134* 08/15/2015   NA 137 09/23/2014   K 4.2 08/16/2015   K 4.1 08/15/2015   K 5.3* 08/05/2015   CL 91* 08/16/2015   CL 93* 08/15/2015   CL 95* 09/23/2014   CO2 20* 08/16/2015   CO2 22 08/15/2015   CO2 23 09/23/2014   GLUCOSE 88 08/16/2015   GLUCOSE 86 08/15/2015   GLUCOSE 96 09/23/2014   BUN 73* 08/16/2015   BUN 68* 08/15/2015   BUN 35* 09/23/2014   CREATININE 16.75* 08/16/2015   CREATININE 16.24* 08/15/2015   CREATININE 10.01* 09/23/2014   CALCIUM 7.7* 08/16/2015   CALCIUM 8.1* 08/15/2015   CALCIUM 8.8 09/23/2014   LFT  Recent Labs  08/15/15 1609 08/16/15 0241  PROT 7.2 6.5  ALBUMIN 3.8 3.3*  AST 24 16  ALT 17 16  ALKPHOS 106 94  BILITOT 1.2 1.0   PT/INR Lab  Results  Component Value Date   INR 1.16 09/19/2014   INR 1.11 08/07/2012   INR 1.0 06/02/2009   Hepatitis Panel No results for input(s): HEPBSAG, HCVAB, HEPAIGM, HEPBIGM in the last 72 hours. C-Diff No components found for: CDIFF Lipase     Component Value Date/Time   LIPASE 23 08/15/2015 1609    Drugs of Abuse  No results found for: LABOPIA, COCAINSCRNUR, LABBENZ, AMPHETMU, THCU, LABBARB   RADIOLOGY STUDIES: Ct Abdomen Pelvis Wo Contrast  08/15/2015   CLINICAL DATA:  Generalized abdominal pain, chronic but worsening over the past several days  EXAM: CT ABDOMEN AND PELVIS WITHOUT CONTRAST  TECHNIQUE: Multidetector CT imaging of the abdomen and pelvis was performed following the standard protocol without IV contrast.  COMPARISON:  09/30/2014   FINDINGS: Lower chest: Left greater than right patchy dependent bibasilar consolidation is noted. Trace pleural effusions. Early pneumonia could appear similar to atelectasis.  Hepatobiliary: Unenhanced liver and gallbladder appear normal.  Pancreas: Normal  Spleen: Normal  Adrenals/Urinary Tract: Bilateral renal atrophy with low-density renal cortical cysts reidentified. No hydroureteronephrosis. No radiopaque renal or ureteral calculus. The bladder is decompressed.  Stomach/Bowel: There is short segmental terminal ileal wall thickening with mild surrounding stranding and abnormal hypodense thickening of this cecum wall. No pericolonic fluid collection to suggest abscess.  Vascular/Lymphatic: Vascular collaterals are present within the anterior abdominal wall. No aortic aneurysm. No lymphadenopathy. Lower extremity vascular bypass is partly visualized.  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 Uterus and ovaries appear unremarkable.  Musculoskeletal: Bilateral L5 pars interarticularis defects. No acute osseous abnormality.  IMPRESSION: Interval increase in terminal ileal wall thickening with contiguous abnormal cecal mural low density most likely indicating inflammatory bowel disease such as Crohn's disease. Colitis/enteritis could appear similar. The appearance is much less typical for ischemia. Lymphoma could appear similar given the presumed previous history of immunosuppression but is less likely.  Probable dependent bibasilar atelectasis. Early pneumonia could appear similar.   Electronically Signed   By: Conchita Paris M.D.   On: 08/15/2015 21:18    ENDOSCOPIC STUDIES: Per HPI  IMPRESSION:   *  Abdominal pain with dark stool.  Terminal ileal/cecal inflammation.  Rule out IBD/Crohn's.  Though atypical location for ischemic effect, given her chronic hypotension and HD requirement, ischemic insult also a possiblity. Less likely infectious etiology but admitting MD initiated broad  spectrum abx   *  ESRD.  Transplanted 2000, failed 2007.  On transplant list at [facility].   *  Anemia. Normocytic.  Requires periodic parenteral iron given at HD.   Says she has not been receiving epogen like infusions in several months.   *  Hyponatremia  *  Chronic Plavix for hx vascular thrombosis. Last dose was 8/29.     PLAN:     *  ? Colonoscopy with intubation of TI?  Dr Fuller Plan to see pt.   *  Ok to have clears today, though not very hungry.   *  Not clear she needs Cipro/flagyl/vanco (no leukocytosis or fever).  Will leave in place for now.    Azucena Freed  08/16/2015, 9:11 AM Pager: [phone]      Attending physician's note   I have taken a history, examined the patient and reviewed the chart. I agree with the Advanced Practitioner's note, impression and recommendations. Abdominal pain with TI/cecal wall thickening on CT without IV contrast. Diarrhea has improved. Possible ischemic event. Need to R/O IBD/ Crohn's/etc. Schedule colonoscopy for Friday to allow an additional day  for Plavix wash out. Clear liquids for now.   Ladene Artist, MD Marval Regal [phone] pager Mon-Fri 8a-5p [phone] weekends, holidays and 5p-8a or per [facility]

## 2015-08-16 NOTE — Progress Notes (Signed)
ANTIBIOTIC CONSULT NOTE - INITIAL  Pharmacy Consult for Vancocin and Zosyn Indication: rule out pneumonia and intra-abdominal infection  No Known Allergies  Patient Measurements: Height: 5\' 7"  (170.2 cm) Weight: 247 lb 9.2 oz (112.3 kg) IBW/kg (Calculated) : 61.6  Vital Signs: Temp: 99 F (37.2 C) (08/31 0413) Temp Source: Oral (08/31 0413) BP: 102/53 mmHg (08/31 0413) Pulse Rate: 97 (08/31 0413)  Labs:  Recent Labs  08/15/15 1609 08/16/15 0241  WBC 10.3 10.2  HGB 12.3 10.7*  PLT 185 172  CREATININE 16.24* 16.75*   Estimated Creatinine Clearance: 5.1 mL/min (by C-G formula based on Cr of 16.75).   Microbiology: Recent Results (from the past 720 hour(s))  C difficile quick scan w PCR reflex     Status: None   Collection Time: 08/15/15 11:14 PM  Result Value Ref Range Status   C Diff antigen NEGATIVE NEGATIVE Final   C Diff toxin NEGATIVE NEGATIVE Final   C Diff interpretation Negative for toxigenic C. difficile  Final    Medical History: Past Medical History  Diagnosis Date  . 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 Went back on HD Aug 2007 and remains on HD at [facility] on TTS schedule. Just got back on Tx list as of fall 2015.   Marland Kitchen Rectal bleeding 2007    hemorrhoids/diverticular disease  . Retroperitoneal hematoma 9/07    transfusion  . H/O parathyroidectomy 1999  . UTI (lower urinary tract infection)   . Adrenal insufficiency 2009  . Sleep apnea     does not have cpap at this time  . Oral contraceptive use     patient states has not had menstral cycle in 1 year.  . Hypertension     Dr. Seward Carol [phone]  . Chronic headaches   . H/O syncope   . Breast cancer     RT  . Anemia     hx of  . History of multiple failed HD accesses 09/19/2014    Has had multiple failed HD accesses, including but not limited to LUA AVF, RUA AVG, two LUA HeRO accesses, L thigh AVG, R thigh AVG  and two RUA HeRO accesses.  As of fall 2015 her 2nd RUA HeRO occluded and attempt to salvage is pending. Access work is mainly done in [place] with [facility] also involved.    . Chronic hypotension 07/22/2012    Medications:  Prescriptions prior to admission  Medication Sig Dispense Refill Last Dose  . aspirin EC 325 MG tablet Take 325 mg by mouth daily.   08/14/2015 at Unknown time  . Calcium Acetate 667 MG TABS Take 1,334-2,001 mg by mouth 3 (three) times daily with meals. Take 2001 mg by mouth 3 times daily with meals. Take 1334 mg by mouth twice daily with snacks or deserts.   08/14/2015 at Unknown time  . clopidogrel (PLAVIX) 75 MG tablet Take 75 mg by mouth daily.     08/14/2015 at Unknown time  . fludrocortisone (FLORINEF) 0.1 MG tablet Take 2 tablets (0.2 mg total) by mouth daily. 60 tablet 0 08/14/2015 at Unknown time  . gabapentin (NEURONTIN) 100 MG capsule Take 100 mg by mouth at bedtime.    08/14/2015 at Unknown time  . midodrine (PROAMATINE) 10 MG tablet Take 1 tablet (10 mg total) by mouth 3 (three) times daily. 30 tablet 1 08/14/2015 at Unknown time  . norethindrone (MICRONOR,CAMILA,ERRIN) 0.35  MG tablet Take 1 tablet (0.35 mg total) by mouth daily. 1 Package 5 Past Week at Unknown time  . SENSIPAR 30 MG tablet Take 30 mg by mouth daily.    08/14/2015 at Unknown time  . cyclobenzaprine (FLEXERIL) 5 MG tablet Take 1 tablet (5 mg total) by mouth 3 (three) times daily as needed for muscle spasms. (Patient not taking: Reported on 11/23/2014) 15 tablet 0 Not Taking at Unknown time   Scheduled:  . aspirin EC  325 mg Oral Daily  . calcium acetate  2,001 mg Oral TID WC  . ciprofloxacin  400 mg Intravenous Q24H  . clopidogrel  75 mg Oral Daily  . enoxaparin (LOVENOX) injection  30 mg Subcutaneous Q24H  . fludrocortisone  0.2 mg Oral Daily  . gabapentin  100 mg Oral QHS  . midodrine  10 mg Oral TID WC  . sodium chloride  3 mL Intravenous Q12H    Assessment: 49yo female c/o worsening  abdominal pain, CT of abdomen showed colitis/enteritis and incidentally showed possible early PNA, to begin IV ABX.  Goal of Therapy:  Pre-HD vanc level 15-25  Plan:  Will give vancomycin 2500mg  IV x1 followed by 1000mg  after each HD as well as Zosyn 2.25g IV Q8H and monitor CBC, Cx, levels prn.  Wynona Neat, PharmD, BCPS  08/16/2015,6:40 AM

## 2015-08-16 NOTE — Consult Note (Signed)
[facility] Renal Consultation Note    Indication for Consultation:  Management of ESRD/hemodialysis; anemia, hypertension/volume and secondary hyperparathyroidism PCP: Dr. Delfina Redwood  HPI: Judy EREN Green is a 49 y.o. 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  She had an uneventful HD Saturday without associated N, V, fever. She did have a large loose black stool.  She went to the beach on Saturday and Sunday started feeling bad. She ate very little while there, though was able to walk on the beach.  Her pain worsened and now feels like contractions and comes in waves with little relief from pain meds.  She has no SOB or CP though her abdominal pain hurts more when she takes a deep breath. She was unable to go to dialysis Tuesday due to pain and instead came to the ED for evaluation and subsequent admission.  Monthly labs at dialysis show a decline in tsat from 23% in July to 15% in August with ferritin 1133 in July.  Hgb 11.2 8/25.  Evaluation in the ED showed normal WBC, pt afebrile. CT showed inflammation of the termial ileum suspicious for Chrohn's or IBD. FOBT by GI was negative.   Past Medical History  Diagnosis Date  . 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 Went back on HD Aug 2007 and remains on HD at [facility] on TTS schedule. Just got back on Tx list as of fall 2015.   Marland Kitchen Rectal bleeding 2007    hemorrhoids/diverticular disease  . Retroperitoneal hematoma 9/07    transfusion  . H/O parathyroidectomy 1999  . UTI (lower urinary tract infection)   . Adrenal  insufficiency 2009  . Sleep apnea     does not have cpap at this time  . Oral contraceptive use     patient states has not had menstral cycle in 1 year.  . Hypertension     Dr. Seward Carol [phone]  . Chronic headaches   . H/O syncope   . Breast cancer     RT  . Anemia     hx of  . History of multiple failed HD accesses 09/19/2014    Has had multiple failed HD accesses, including but not limited to LUA AVF, RUA AVG, two LUA HeRO accesses, L thigh AVG, R thigh AVG and two RUA HeRO accesses.  As of fall 2015 her 2nd RUA HeRO occluded and attempt to salvage is pending. Access work is mainly done in [place] with [facility] also involved.    . Chronic hypotension 07/22/2012   Past Surgical History  Procedure Laterality Date  . Av fistula placement Right 05/08/2007  . Hero avg Left 12/14/2008  . Hero  04/17/2009    Removed clotted Left Hero  2) New Right Hero placed and Stent to SVC  . Breast lumpectomy Left   . Exploration renal s/p transplantation    . Tubal ligation    . Avgg removal  08/07/2012    Procedure: REMOVAL OF ARTERIOVENOUS GORETEX GRAFT ([place]);  Surgeon: Rosetta Posner, MD;  Location: [place] [facility];  Service: Vascular;  Laterality: Right;  Family History  Problem Relation Age of Onset  . Diabetes Mother   . Diabetes Maternal Aunt     x 3  . Colon cancer Maternal Uncle   . Heart disease Maternal Grandmother   . Diabetes Maternal Grandmother   . Stroke Maternal Grandmother   . Hypertension Brother   . Hypertension Brother   . Hypertension Brother   . Prostate cancer Maternal Uncle    Social History:  reports that she quit smoking about 12 years ago. She has never used smokeless tobacco. She reports that she drinks alcohol. She reports that she does not use illicit drugs. No Known Allergies Prior to Admission medications   Medication Sig Start Date End Date Taking? Authorizing Provider  aspirin EC 325 MG tablet Take 325 mg by mouth daily.   Yes Historical Provider, MD   Calcium Acetate 667 MG TABS Take 1,334-2,001 mg by mouth 3 (three) times daily with meals. Take 2001 mg by mouth 3 times daily with meals. Take 1334 mg by mouth twice daily with snacks or deserts.   Yes Historical Provider, MD  clopidogrel (PLAVIX) 75 MG tablet Take 75 mg by mouth daily.     Yes Historical Provider, MD  fludrocortisone (FLORINEF) 0.1 MG tablet Take 2 tablets (0.2 mg total) by mouth daily. 12/12/14  Yes Renato Shin, MD  gabapentin (NEURONTIN) 100 MG capsule Take 100 mg by mouth at bedtime.  11/18/13  Yes Historical Provider, MD  midodrine (PROAMATINE) 10 MG tablet Take 1 tablet (10 mg total) by mouth 3 (three) times daily. 09/06/12  Yes Theodis Blaze, MD  norethindrone (MICRONOR,CAMILA,ERRIN) 0.35 MG tablet Take 1 tablet (0.35 mg total) by mouth daily. 11/23/14  Yes Lahoma Crocker, MD  SENSIPAR 30 MG tablet Take 30 mg by mouth daily.  07/08/12  Yes Historical Provider, MD  cyclobenzaprine (FLEXERIL) 5 MG tablet Take 1 tablet (5 mg total) by mouth 3 (three) times daily as needed for muscle spasms. Patient not taking: Reported on 11/23/2014 09/23/14   Delfina Redwood, MD   Current Facility-Administered Medications  Medication Dose Route Frequency Provider Last Rate Last Dose  . acetaminophen (TYLENOL) tablet 650 mg  650 mg Oral Q6H PRN Rise Patience, MD       Or  . acetaminophen (TYLENOL) suppository 650 mg  650 mg Rectal Q6H PRN Rise Patience, MD      . calcium acetate (PHOSLO) capsule 1,334 mg  1,334 mg Oral BID PRN Rise Patience, MD      . calcium acetate (PHOSLO) capsule 2,001 mg  2,001 mg Oral TID WC Rise Patience, MD   2,001 mg at 08/16/15 0800  . enoxaparin (LOVENOX) injection 30 mg  30 mg Subcutaneous Q24H Rise Patience, MD   30 mg at 08/16/15 0935  . fentaNYL (SUBLIMAZE) injection 25 mcg  25 mcg Intravenous Q2H PRN Rise Patience, MD   25 mcg at 08/16/15 1042  . fludrocortisone (FLORINEF) tablet 0.2 mg  0.2 mg Oral Daily Rise Patience, MD      . gabapentin (NEURONTIN) capsule 100 mg  100 mg Oral QHS Rise Patience, MD   100 mg at 08/16/15 0011  . metroNIDAZOLE (FLAGYL) IVPB 500 mg  500 mg Intravenous Q8H Dawood S Elgergawy, MD      . midodrine (PROAMATINE) tablet 10 mg  10 mg Oral TID WC Rise Patience, MD   10 mg at 08/16/15 0741  . ondansetron (ZOFRAN) tablet 4 mg  4  mg Oral Q6H PRN Rise Patience, MD       Or  . ondansetron [facility]) injection 4 mg  4 mg Intravenous Q6H PRN Rise Patience, MD      . sodium chloride 0.9 % injection 3 mL  3 mL Intravenous Q12H Rise Patience, MD   3 mL at 08/16/15 0012   Labs: Basic Metabolic Panel:  Recent Labs Lab 08/15/15 1609 08/16/15 0241  NA 134* 130*  K 4.1 4.2  CL 93* 91*  CO2 22 20*  GLUCOSE 86 88  BUN 68* 73*  CREATININE 16.24* 16.75*  CALCIUM 8.1* 7.7*   Liver Function Tests:  Recent Labs Lab 08/15/15 1609 08/16/15 0241  AST 24 16  ALT 17 16  ALKPHOS 106 94  BILITOT 1.2 1.0  PROT 7.2 6.5  ALBUMIN 3.8 3.3*    Recent Labs Lab 08/15/15 1609  LIPASE 23   CBC:  Recent Labs Lab 08/15/15 1609 08/16/15 0241  WBC 10.3 10.2  NEUTROABS 8.4* 7.9*  HGB 12.3 10.7*  HCT 37.7 32.9*  MCV 98.2 96.8  PLT 185 172   CBG:  Recent Labs Lab 08/16/15 0019 08/16/15 0411 08/16/15 0748 08/16/15 1153  GLUCAP 94 85 85 96   Studies/Results: Ct Abdomen Pelvis Wo Contrast  08/15/2015   CLINICAL DATA:  Generalized abdominal pain, chronic but worsening over the past several days  EXAM: CT ABDOMEN AND PELVIS WITHOUT CONTRAST  TECHNIQUE: Multidetector CT imaging of the abdomen and pelvis was performed following the standard protocol without IV contrast.  COMPARISON:  09/30/2014  FINDINGS: Lower chest: Left greater than right patchy dependent bibasilar consolidation is noted. Trace pleural effusions. Early pneumonia could appear similar to atelectasis.  Hepatobiliary: Unenhanced liver and gallbladder appear normal.  Pancreas: Normal   Spleen: Normal  Adrenals/Urinary Tract: Bilateral renal atrophy with low-density renal cortical cysts reidentified. No hydroureteronephrosis. No radiopaque renal or ureteral calculus. The bladder is decompressed.  Stomach/Bowel: There is short segmental terminal ileal wall thickening with mild surrounding stranding and abnormal hypodense thickening of this cecum wall. No pericolonic fluid collection to suggest abscess.  Vascular/Lymphatic: Vascular collaterals are present within the anterior abdominal wall. No aortic aneurysm. No lymphadenopathy. Lower extremity vascular bypass is partly visualized.  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 Uterus and ovaries appear unremarkable.  Musculoskeletal: Bilateral L5 pars interarticularis defects. No acute osseous abnormality.  IMPRESSION: Interval increase in terminal ileal wall thickening with contiguous abnormal cecal mural low density most likely indicating inflammatory bowel disease such as Crohn's disease. Colitis/enteritis could appear similar. The appearance is much less typical for ischemia. Lymphoma could appear similar given the presumed previous history of immunosuppression but is less likely.  Probable dependent bibasilar atelectasis. Early pneumonia could appear similar.   Electronically Signed   By: Conchita Paris M.D.   On: 08/15/2015 21:18    ROS: As per HPI otherwise negative. Physical Exam: Filed Vitals:   08/15/15 2224 08/15/15 2256 08/16/15 0413 08/16/15 0927  BP: 103/54 113/49 102/53 104/68  Pulse: 91 95 97 77  Temp:  99.1 F (37.3 C) 99 F (37.2 C) 98.5 F (36.9 C)  TempSrc:  Oral Oral Oral  Resp: 20 17 20 18   Height:  5\' 7"  (1.702 m)    Weight:  112.3 kg (247 lb 9.2 oz)    SpO2: 100% 98% 100% 97%     General: Well developed, well nourished, in no acute distress. Head: Normocephalic, atraumatic, sclera non-icteric, mucus membranes are moist Neck: Supple. JVD  not elevated. Lungs: Clear bilaterally to  auscultation without wheezes, rales, or rhonchi. Breathing is unlabored. Heart: RRR with S1 S2. No murmurs, rubs, or gallops appreciated. Abdomen: obese Soft,Rsided and RLQ-tender, non-distended with normoactive bowel sounds.  M-S:  Strength and tone appear normal for age. Lower extremities: no overt edema or ischemic changes, no open wounds  Neuro: Alert and oriented X 3. Moves all extremities spontaneously. Psych:  Responds to questions appropriately with a normal affect. Dialysis Access:right arm HERO + bruit  Dialysis Orders: [facility] 4 hr 450/800 right HERO 2 K 2 Ca profile heparin 3000 with 3000 min tmt, Mircera 75 last 8/16 venofer 100 x 5 start 9/1 calcitriol 0.75 Recent labs:  Hgb 11.2 8/25 15% sat ferritin 1133 July 23% sat July iPTH 456 coming down with normal Ca/P  Assessment/Plan: 1. Abdominal pain with CT suspicious for IBD/Crohn's.  GI seeing. Empiric Cipro, flagyl and Vanc started. 2. ESRD -  TTS - missed HD yesterday K 4.2 today Cr 16.75 CO2 20 - try short HD today due to high pt load and again on Thursday - to get some volume down. 3. BP/volume  - midodrine - for BP support , volume up 5 kg  if weights are correct - able to get to edw but cannot UF much more than 4 L at a time. 4. Anemia  - Hgb 10.7 - continue Fe and ESA 5. Metabolic bone disease -  Ca 7.7 -continue calcitriol and binders 6. Nutrition - Alb 3.3 - on CL due to need for colonoscopy 7. Adrenal insufficiency -on fludrocortisone - generally not as effective as solucortef, though she has been on this for quite some time  Myriam Jacobson, PA-C [place] [phone] 08/16/2015, 12:25 PM

## 2015-08-16 NOTE — Progress Notes (Signed)
Utilization Review Completed.Donne Anon T8/31/2016

## 2015-08-17 DIAGNOSIS — R933 Abnormal findings on diagnostic imaging of other parts of digestive tract: Secondary | ICD-10-CM | POA: Insufficient documentation

## 2015-08-17 DIAGNOSIS — D631 Anemia in chronic kidney disease: Secondary | ICD-10-CM | POA: Insufficient documentation

## 2015-08-17 DIAGNOSIS — N189 Chronic kidney disease, unspecified: Secondary | ICD-10-CM

## 2015-08-17 LAB — GLUCOSE, CAPILLARY
GLUCOSE-CAPILLARY: 81 mg/dL (ref 65–99)
GLUCOSE-CAPILLARY: 84 mg/dL (ref 65–99)
GLUCOSE-CAPILLARY: 92 mg/dL (ref 65–99)
Glucose-Capillary: 94 mg/dL (ref 65–99)
Glucose-Capillary: 90 mg/dL (ref 65–99)
Glucose-Capillary: 89 mg/dL (ref 65–99)

## 2015-08-17 LAB — PROTIME-INR
INR: 1.45 (ref 0.00–1.49)
Prothrombin Time: 17.7 seconds — ABNORMAL HIGH (ref 11.6–15.2)

## 2015-08-17 MED ORDER — METRONIDAZOLE IN NACL 5-0.79 MG/ML-% IV SOLN
500.0000 mg | Freq: Three times a day (TID) | INTRAVENOUS | Status: DC
  Administered 2015-08-17: 500 mg via INTRAVENOUS
  Filled 2015-08-17: qty 100

## 2015-08-17 MED ORDER — CIPROFLOXACIN IN D5W 400 MG/200ML IV SOLN: 400.0000 mg | INTRAVENOUS | Status: DC

## 2015-08-17 MED ORDER — METOCLOPRAMIDE HCL 5 MG/ML IJ SOLN: 10.0000 mg | Freq: Once | INTRAMUSCULAR | Status: DC

## 2015-08-17 MED ORDER — DARBEPOETIN ALFA 60 MCG/0.3ML IJ SOSY
60.0000 ug | PREFILLED_SYRINGE | INTRAMUSCULAR | Status: DC
  Filled 2015-08-17: qty 0.3

## 2015-08-17 MED ORDER — METOCLOPRAMIDE HCL 5 MG/ML IJ SOLN
10.0000 mg | Freq: Once | INTRAMUSCULAR | Status: AC
  Administered 2015-08-17: 10 mg via INTRAVENOUS
  Filled 2015-08-17: qty 2

## 2015-08-17 MED ORDER — BISACODYL 5 MG PO TBEC
5.0000 mg | DELAYED_RELEASE_TABLET | Freq: Three times a day (TID) | ORAL | Status: AC
  Administered 2015-08-17 (×2): 5 mg via ORAL
  Filled 2015-08-17 (×2): qty 1

## 2015-08-17 MED ORDER — PEG-KCL-NACL-NASULF-NA ASC-C 100 G PO SOLR
0.5000 | Freq: Once | ORAL | Status: AC
  Administered 2015-08-17: 100 g via ORAL
  Filled 2015-08-17: qty 1

## 2015-08-17 MED ORDER — ASPIRIN EC 325 MG PO TBEC
325.0000 mg | DELAYED_RELEASE_TABLET | Freq: Every day | ORAL | Status: DC
  Administered 2015-08-17 – 2015-08-21 (×4): 325 mg via ORAL
  Filled 2015-08-17 (×4): qty 1

## 2015-08-17 MED ORDER — METOCLOPRAMIDE HCL 5 MG/ML IJ SOLN
10.0000 mg | Freq: Once | INTRAMUSCULAR | Status: AC
  Administered 2015-08-18: 10 mg via INTRAVENOUS
  Filled 2015-08-17: qty 2

## 2015-08-17 MED ORDER — PEG-KCL-NACL-NASULF-NA ASC-C 100 G PO SOLR: 1.0000 | Freq: Once | ORAL | Status: DC

## 2015-08-17 MED ORDER — PEG-KCL-NACL-NASULF-NA ASC-C 100 G PO SOLR
0.5000 | Freq: Once | ORAL | Status: AC
  Administered 2015-08-18: 0.5 g via ORAL
  Filled 2015-08-17: qty 1

## 2015-08-17 MED ORDER — DARBEPOETIN ALFA 60 MCG/0.3ML IJ SOSY: 60.0000 ug | PREFILLED_SYRINGE | INTRAMUSCULAR | Status: DC

## 2015-08-17 NOTE — Progress Notes (Signed)
[facility] ROUNDING NOTE   Subjective:   Interval History: Pain better  Objective:  Vital signs in last 24 hours:  Temp:  [97.9 F (36.6 C)-98.2 F (36.8 C)] 98.2 F (36.8 C) (09/01 0918) Pulse Rate:  [70-91] 91 (09/01 0918) Resp:  [16-19] 18 (09/01 0918) BP: (77-104)/(40-65) 100/40 mmHg (09/01 0918) SpO2:  [93 %-100 %] 94 % (09/01 0918) Weight:  [109.7 kg (241 lb 13.5 oz)-112.9 kg (248 lb 14.4 oz)] 109.7 kg (241 lb 13.5 oz) (08/31 1802)  Weight change: 1.678 kg (3 lb 11.2 oz) Filed Weights   08/15/15 2256 08/16/15 1500 08/16/15 1802  Weight: 112.3 kg (247 lb 9.2 oz) 112.9 kg (248 lb 14.4 oz) 109.7 kg (241 lb 13.5 oz)    Intake/Output: I/O last 3 completed shifts: In: 4098 [P.O.:960; I.V.:503] Out: 3092 [Other:2992; Stool:100]   Intake/Output this shift:     CVS- RRR RS- CTA ABD- BS present soft non-distended   AVG right clotted EXT- no edema   Basic Metabolic Panel:  Recent Labs Lab 08/15/15 1609 08/16/15 0241 08/16/15 1500  NA 134* 130* 132*  K 4.1 4.2 4.6  CL 93* 91* 95*  CO2 22 20* 20*  GLUCOSE 86 88 83  BUN 68* 73* 80*  CREATININE 16.24* 16.75* 17.49*  CALCIUM 8.1* 7.7* 7.7*  PHOS  --   --  7.6*    Liver Function Tests:  Recent Labs Lab 08/15/15 1609 08/16/15 0241 08/16/15 1500  AST 24 16  --   ALT 17 16  --   ALKPHOS 106 94  --   BILITOT 1.2 1.0  --   PROT 7.2 6.5  --   ALBUMIN 3.8 3.3* 3.1*    Recent Labs Lab 08/15/15 1609  LIPASE 23   No results for input(s): AMMONIA in the last 168 hours.  CBC:  Recent Labs Lab 08/15/15 1609 08/16/15 0241 08/16/15 1500  WBC 10.3 10.2 9.0  NEUTROABS 8.4* 7.9*  --   HGB 12.3 10.7* 10.3*  HCT 37.7 32.9* 31.1*  MCV 98.2 96.8 95.1  PLT 185 172 210    Cardiac Enzymes: No results for input(s): CKTOTAL, CKMB, CKMBINDEX, TROPONINI in the last 168 hours.  BNP: Invalid input(s): POCBNP  CBG:  Recent Labs Lab 08/16/15 1849 08/16/15 2003 08/17/15 0009 08/17/15 0404  08/17/15 0739  GLUCAP 53* 118* 81 92 94    Microbiology: Results for orders placed or performed during the hospital encounter of 08/15/15  C difficile quick scan w PCR reflex     Status: None   Collection Time: 08/15/15 11:14 PM  Result Value Ref Range Status   C Diff antigen NEGATIVE NEGATIVE Final   C Diff toxin NEGATIVE NEGATIVE Final   C Diff interpretation Negative for toxigenic C. difficile  Final    Coagulation Studies: No results for input(s): LABPROT, INR in the last 72 hours.  Urinalysis: No results for input(s): COLORURINE, LABSPEC, PHURINE, GLUCOSEU, HGBUR, BILIRUBINUR, KETONESUR, PROTEINUR, UROBILINOGEN, NITRITE, LEUKOCYTESUR in the last 72 hours.  Invalid input(s): APPERANCEUR    Imaging: Ct Abdomen Pelvis Wo Contrast  08/15/2015   CLINICAL DATA:  Generalized abdominal pain, chronic but worsening over the past several days  EXAM: CT ABDOMEN AND PELVIS WITHOUT CONTRAST  TECHNIQUE: Multidetector CT imaging of the abdomen and pelvis was performed following the standard protocol without IV contrast.  COMPARISON:  09/30/2014  FINDINGS: Lower chest: Left greater than right patchy dependent bibasilar consolidation is noted. Trace pleural effusions. Early pneumonia could appear similar to atelectasis.  Hepatobiliary: Unenhanced liver and gallbladder appear normal.  Pancreas: Normal  Spleen: Normal  Adrenals/Urinary Tract: Bilateral renal atrophy with low-density renal cortical cysts reidentified. No hydroureteronephrosis. No radiopaque renal or ureteral calculus. The bladder is decompressed.  Stomach/Bowel: There is short segmental terminal ileal wall thickening with mild surrounding stranding and abnormal hypodense thickening of this cecum wall. No pericolonic fluid collection to suggest abscess.  Vascular/Lymphatic: Vascular collaterals are present within the anterior abdominal wall. No aortic aneurysm. No lymphadenopathy. Lower extremity vascular bypass is partly visualized.   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 Uterus and ovaries appear unremarkable.  Musculoskeletal: Bilateral L5 pars interarticularis defects. No acute osseous abnormality.  IMPRESSION: Interval increase in terminal ileal wall thickening with contiguous abnormal cecal mural low density most likely indicating inflammatory bowel disease such as Crohn's disease. Colitis/enteritis could appear similar. The appearance is much less typical for ischemia. Lymphoma could appear similar given the presumed previous history of immunosuppression but is less likely.  Probable dependent bibasilar atelectasis. Early pneumonia could appear similar.   Electronically Signed   By: Conchita Paris M.D.   On: 08/15/2015 21:18     Medications:     . calcitRIOL  0.75 mcg Oral Q T,Th,Sa-HD  . calcium acetate  2,001 mg Oral TID WC  . darbepoetin (ARANESP) injection - DIALYSIS  60 mcg Intravenous Q Thu-HD  . ferric gluconate (FERRLECIT/NULECIT) IV  125 mg Intravenous Q T,Th,Sa-HD  . fludrocortisone  0.2 mg Oral Daily  . gabapentin  100 mg Oral QHS  . midodrine  10 mg Oral TID WC  . sodium chloride  3 mL Intravenous Q12H   sodium chloride, sodium chloride, acetaminophen **OR** acetaminophen, alteplase, calcium acetate, fentaNYL (SUBLIMAZE) injection, heparin, heparin, lidocaine (PF), lidocaine-prilocaine, ondansetron **OR** ondansetron (ZOFRAN) IV, pentafluoroprop-tetrafluoroeth  Assessment/ Plan:   ESRD- HD MWF  ANEMIA- Hb 10.3  MBD- Vit D Ca 7.7  HTN/VOL- chronic hypotension Midodrine  ACCESS- AVF  Appears clotted IR to declot  ( plavix stopped for procedure )  Admitted with colitis  --  Per priimary team      LOS: 2 Tylea Hise W @TODAY @9 :48 AM

## 2015-08-17 NOTE — Progress Notes (Signed)
[facility] Progress Note  Assessment/Plan: 1. Abdominal pain with CT suspicious for IBD/Crohn's. GI seeing. Empiric Cipro, flagyl and Vanc started; colonoscopy for Friday am, plavix  Asa on hold 2. ESRD - TTS - missed HD Tuesday - had Wed for 3 hours and clotted today; plan check BMP in and schedule for HD Friday after declot 3. BP/volume - midodrine - for BP support , Net UF Wed 2.9 with post weight 109.7 - still above EDW 4. Anemia - Hgb 10.1 - continue Fe and ESA- moved Aranesp to Friday 5. Metabolic bone disease - Ca 7.7 -continue calcitriol and binders 6. Nutrition - Alb 3.1 - on CL due to need for colonoscopy 7. Adrenal insufficiency -on fludrocortisone - generally not as effective as solucortef, though she has been on this for quite some time 8. Clotted right upper HERO - IR unable to declot today last declot 8/22 post declot AF not improved Had PTA of venous intragraft 50% stenosis to < 10% by Dr. Augustin Coupe- have ordered for tomorrow with HD to follow - need to do after colonoscopy  Myriam Jacobson, PA-C [place] [phone] 08/17/2015,9:47 AM  LOS: 2 days   Subjective:   I think my access is clotted. Can always feel it.  Objective Filed Vitals:   08/16/15 1844 08/16/15 2006 08/17/15 0406 08/17/15 0918  BP: 99/65 88/50 99/53  100/40  Pulse: 82 77 80 91  Temp: 98 F (36.7 C) 98.2 F (36.8 C) 98.2 F (36.8 C) 98.2 F (36.8 C)  TempSrc: Oral   Oral  Resp: 18 18 19 18   Height:      Weight:      SpO2: 95% 99% 100% 94%   Physical Exam General: NAD Heart: RRR Lungs: no rales Abdomen: soft, right sided tenderness Extremities: no sig edema Dialysis Access: right upper HERO no bruit or thrill  Dialysis Orders: [facility] 4 hr 450/800 right HERO 2 K 2 Ca profile heparin 3000 with 3000 min tmt, Mircera 75 last 8/16 venofer 100 x 5 start 9/1 calcitriol 0.75 Recent labs: Hgb 11.2 8/25 15% sat ferritin 1133 July 23% sat July iPTH 456 coming  down with normal Ca/P  Additional Objective Labs: Basic Metabolic Panel:  Recent Labs Lab 08/15/15 1609 08/16/15 0241 08/16/15 1500  NA 134* 130* 132*  K 4.1 4.2 4.6  CL 93* 91* 95*  CO2 22 20* 20*  GLUCOSE 86 88 83  BUN 68* 73* 80*  CREATININE 16.24* 16.75* 17.49*  CALCIUM 8.1* 7.7* 7.7*  PHOS  --   --  7.6*   Liver Function Tests:  Recent Labs Lab 08/15/15 1609 08/16/15 0241 08/16/15 1500  AST 24 16  --   ALT 17 16  --   ALKPHOS 106 94  --   BILITOT 1.2 1.0  --   PROT 7.2 6.5  --   ALBUMIN 3.8 3.3* 3.1*    Recent Labs Lab 08/15/15 1609  LIPASE 23   CBC:  Recent Labs Lab 08/15/15 1609 08/16/15 0241 08/16/15 1500  WBC 10.3 10.2 9.0  NEUTROABS 8.4* 7.9*  --   HGB 12.3 10.7* 10.3*  HCT 37.7 32.9* 31.1*  MCV 98.2 96.8 95.1  PLT 185 172 210    Studies/Results: Ct Abdomen Pelvis Wo Contrast  08/15/2015   CLINICAL DATA:  Generalized abdominal pain, chronic but worsening over the past several days  EXAM: CT ABDOMEN AND PELVIS WITHOUT CONTRAST  TECHNIQUE: Multidetector CT imaging of the abdomen and pelvis was performed following  the standard protocol without IV contrast.  COMPARISON:  09/30/2014  FINDINGS: Lower chest: Left greater than right patchy dependent bibasilar consolidation is noted. Trace pleural effusions. Early pneumonia could appear similar to atelectasis.  Hepatobiliary: Unenhanced liver and gallbladder appear normal.  Pancreas: Normal  Spleen: Normal  Adrenals/Urinary Tract: Bilateral renal atrophy with low-density renal cortical cysts reidentified. No hydroureteronephrosis. No radiopaque renal or ureteral calculus. The bladder is decompressed.  Stomach/Bowel: There is short segmental terminal ileal wall thickening with mild surrounding stranding and abnormal hypodense thickening of this cecum wall. No pericolonic fluid collection to suggest abscess.  Vascular/Lymphatic: Vascular collaterals are present within the anterior abdominal wall. No aortic  aneurysm. No lymphadenopathy. Lower extremity vascular bypass is partly visualized.  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 Uterus and ovaries appear unremarkable.  Musculoskeletal: Bilateral L5 pars interarticularis defects. No acute osseous abnormality.  IMPRESSION: Interval increase in terminal ileal wall thickening with contiguous abnormal cecal mural low density most likely indicating inflammatory bowel disease such as Crohn's disease. Colitis/enteritis could appear similar. The appearance is much less typical for ischemia. Lymphoma could appear similar given the presumed previous history of immunosuppression but is less likely.  Probable dependent bibasilar atelectasis. Early pneumonia could appear similar.   Electronically Signed   By: Conchita Paris M.D.   On: 08/15/2015 21:18   Medications:   . calcitRIOL  0.75 mcg Oral Q T,Th,Sa-HD  . calcium acetate  2,001 mg Oral TID WC  . darbepoetin (ARANESP) injection - DIALYSIS  60 mcg Intravenous Q Thu-HD  . ferric gluconate (FERRLECIT/NULECIT) IV  125 mg Intravenous Q T,Th,Sa-HD  . fludrocortisone  0.2 mg Oral Daily  . gabapentin  100 mg Oral QHS  . midodrine  10 mg Oral TID WC  . sodium chloride  3 mL Intravenous Q12H

## 2015-08-17 NOTE — Progress Notes (Signed)
Patient Demographics  Judy Green, is a 49 y.o. female, DOB - 05-22-1966, QHU:[id number]  Admit date - 08/15/2015   Admitting Physician Rise Patience, MD  Outpatient Primary MD for the patient is Judy Hams, MD  LOS - 2   Chief Complaint  Patient presents with  . Abdominal Pain         Subjective:   [name] today has, No headache, No chest pain, No new weakness tingling or numbness, No Cough - SOB, reports some abd pain .  Assessment & Plan    Principal Problem:   Colitis Active Problems:   ESRD on hemodialysis   Chronic hypotension  Abdominal pain and diarrhea -  related to colitis, CT abdomen and pelvis showing colitis involving cecum and terminal ileal area. - Abdominal pain significantly improved, tolerating clear liquid diet. - GI consult appreciated, plan is for colonoscopy tomorrow , will continue to hold Plavix, kit resume aspirin per GI. - C. difficile negative - Continue IV Cipro and Flagyl  Questionable pneumonia on imaging - CT abdomen showing bibasilar infiltrate/atelectasis, no clinical evidence of pneumonia, will discontinue IV vancomycin and Zosyn.  Chronic hypotension - Continue with fludrocortisone and midodrine  End-stage renal disease - Nephrology consulted - On TTS schedule, missed Tuesday, last Wednesday, cannot be dialyzed today secondary to clotted graft(HERO), plan is for dialysis tomorrow after declot.  History of failed renal transplant  Anemia -  related to renal disease  Code Status: Full  Family Communication: Discussed with patient, none at bedside  Disposition Plan: Home when stable   Procedures  none   Consults   Renal GI   Medications  Scheduled Meds: . aspirin EC  325 mg Oral Daily  . calcitRIOL  0.75 mcg Oral Q T,Th,Sa-HD  . calcium acetate  2,001 mg Oral TID WC  . ciprofloxacin  400 mg  Intravenous Q24H  . [START ON 08/18/2015] darbepoetin (ARANESP) injection - DIALYSIS  60 mcg Intravenous Q Fri-HD  . [START ON 09/13/2015] darbepoetin (ARANESP) injection - DIALYSIS  60 mcg Intravenous Q Thu-HD  . ferric gluconate (FERRLECIT/NULECIT) IV  125 mg Intravenous Q T,Th,Sa-HD  . fludrocortisone  0.2 mg Oral Daily  . gabapentin  100 mg Oral QHS  . metoCLOPramide (REGLAN) injection  10 mg Intravenous Once   And  . [START ON 08/18/2015] metoCLOPramide (REGLAN) injection  10 mg Intravenous Once  . metronidazole  500 mg Intravenous Q8H  . midodrine  10 mg Oral TID WC  . peg 3350 powder  0.5 kit Oral Once   And  . [START ON 08/18/2015] peg 3350 powder  0.5 kit Oral Once  . sodium chloride  3 mL Intravenous Q12H   Continuous Infusions:  PRN Meds:.sodium chloride, sodium chloride, acetaminophen **OR** acetaminophen, alteplase, calcium acetate, fentaNYL (SUBLIMAZE) injection, heparin, heparin, lidocaine (PF), lidocaine-prilocaine, ondansetron **OR** ondansetron (ZOFRAN) IV, pentafluoroprop-tetrafluoroeth  DVT Prophylaxis   Heparin -  Lab Results  Component Value Date   PLT 210 08/16/2015    Antibiotics    Anti-infectives    Start     Dose/Rate Route Frequency Ordered Stop   08/17/15 2000  ciprofloxacin (CIPRO) IVPB 400 mg     400 mg 200 mL/hr over 60 Minutes Intravenous Every 24 hours 08/17/15 1007  08/17/15 1200  vancomycin (VANCOCIN) IVPB 1000 mg/200 mL premix  Status:  Discontinued     1,000 mg 200 mL/hr over 60 Minutes Intravenous Every T-Th-Sa (Hemodialysis) 08/16/15 0646 08/16/15 1220   08/17/15 1200  metroNIDAZOLE (FLAGYL) IVPB 500 mg     500 mg 100 mL/hr over 60 Minutes Intravenous Every 8 hours 08/17/15 1007     08/16/15 2200  ciprofloxacin (CIPRO) IVPB 400 mg  Status:  Discontinued     400 mg 200 mL/hr over 60 Minutes Intravenous Every 24 hours 08/15/15 2329 08/16/15 0825   08/16/15 1800  ciprofloxacin (CIPRO) IVPB 400 mg  Status:  Discontinued     400 mg 200 mL/hr  over 60 Minutes Intravenous Every 24 hours 08/16/15 1300 08/16/15 1605   08/16/15 1300  metroNIDAZOLE (FLAGYL) IVPB 500 mg  Status:  Discontinued     500 mg 100 mL/hr over 60 Minutes Intravenous Every 8 hours 08/16/15 1221 08/16/15 1605   08/16/15 0800  piperacillin-tazobactam (ZOSYN) IVPB 2.25 g  Status:  Discontinued     2.25 g 100 mL/hr over 30 Minutes Intravenous Every 8 hours 08/16/15 0645 08/16/15 1220   08/16/15 0700  vancomycin (VANCOCIN) 2,500 mg in sodium chloride 0.9 % 500 mL IVPB     2,500 mg 250 mL/hr over 120 Minutes Intravenous  Once 08/16/15 0645 08/16/15 0941   08/16/15 0600  metroNIDAZOLE (FLAGYL) IVPB 500 mg  Status:  Discontinued     500 mg 100 mL/hr over 60 Minutes Intravenous Every 8 hours 08/15/15 2310 08/16/15 0636   08/15/15 2130  ciprofloxacin (CIPRO) IVPB 400 mg     400 mg 200 mL/hr over 60 Minutes Intravenous  Once 08/15/15 2123 08/15/15 2317   08/15/15 2130  metroNIDAZOLE (FLAGYL) IVPB 500 mg     500 mg 100 mL/hr over 60 Minutes Intravenous  Once 08/15/15 2123 08/15/15 2317          Objective:   Filed Vitals:   08/16/15 1844 08/16/15 2006 08/17/15 0406 08/17/15 0918  BP: 99/65 88/50 99/53  100/40  Pulse: 82 77 80 91  Temp: 98 F (36.7 C) 98.2 F (36.8 C) 98.2 F (36.8 C) 98.2 F (36.8 C)  TempSrc: Oral   Oral  Resp: 18 18 19 18   Height:      Weight:      SpO2: 95% 99% 100% 94%    Wt Readings from Last 3 Encounters:  08/16/15 109.7 kg (241 lb 13.5 oz)  09/23/14 112 kg (246 lb 14.6 oz)  09/22/14 113.5 kg (250 lb 3.6 oz)     Intake/Output Summary (Last 24 hours) at 08/17/15 1122 Last data filed at 08/16/15 2047  Gross per 24 hour  Intake    963 ml  Output   2992 ml  Net  -2029 ml     Physical Exam  Awake Alert, Oriented X 3, No new F.N deficits, Normal affect [place].AT,PERRAL Supple Neck,No JVD, No cervical lymphadenopathy appriciated.  Symmetrical Chest wall movement, Good air movement bilaterally, CTAB RRR,No Gallops,Rubs or new  Murmurs, No Parasternal Heave +ve B.Sounds, Abd Soft, minimal tenderness, No organomegaly appriciated. No Cyanosis, Clubbing or edema, No new Rash or bruise     Data Review   Micro Results Recent Results (from the past 240 hour(s))  C difficile quick scan w PCR reflex     Status: None   Collection Time: 08/15/15 11:14 PM  Result Value Ref Range Status   C Diff antigen NEGATIVE NEGATIVE Final   C Diff toxin NEGATIVE NEGATIVE  Final   C Diff interpretation Negative for toxigenic C. difficile  Final    Radiology Reports Ct Abdomen Pelvis Wo Contrast  08/15/2015   CLINICAL DATA:  Generalized abdominal pain, chronic but worsening over the past several days  EXAM: CT ABDOMEN AND PELVIS WITHOUT CONTRAST  TECHNIQUE: Multidetector CT imaging of the abdomen and pelvis was performed following the standard protocol without IV contrast.  COMPARISON:  09/30/2014  FINDINGS: Lower chest: Left greater than right patchy dependent bibasilar consolidation is noted. Trace pleural effusions. Early pneumonia could appear similar to atelectasis.  Hepatobiliary: Unenhanced liver and gallbladder appear normal.  Pancreas: Normal  Spleen: Normal  Adrenals/Urinary Tract: Bilateral renal atrophy with low-density renal cortical cysts reidentified. No hydroureteronephrosis. No radiopaque renal or ureteral calculus. The bladder is decompressed.  Stomach/Bowel: There is short segmental terminal ileal wall thickening with mild surrounding stranding and abnormal hypodense thickening of this cecum wall. No pericolonic fluid collection to suggest abscess.  Vascular/Lymphatic: Vascular collaterals are present within the anterior abdominal wall. No aortic aneurysm. No lymphadenopathy. Lower extremity vascular bypass is partly visualized.  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 Uterus and ovaries appear unremarkable.  Musculoskeletal: Bilateral L5 pars interarticularis defects. No acute osseous abnormality.   IMPRESSION: Interval increase in terminal ileal wall thickening with contiguous abnormal cecal mural low density most likely indicating inflammatory bowel disease such as Crohn's disease. Colitis/enteritis could appear similar. The appearance is much less typical for ischemia. Lymphoma could appear similar given the presumed previous history of immunosuppression but is less likely.  Probable dependent bibasilar atelectasis. Early pneumonia could appear similar.   Electronically Signed   By: Conchita Paris M.D.   On: 08/15/2015 21:18     CBC  Recent Labs Lab 08/15/15 1609 08/16/15 0241 08/16/15 1500  WBC 10.3 10.2 9.0  HGB 12.3 10.7* 10.3*  HCT 37.7 32.9* 31.1*  PLT 185 172 210  MCV 98.2 96.8 95.1  MCH 32.0 31.5 31.5  MCHC 32.6 32.5 33.1  RDW 16.0* 15.7* 15.4  LYMPHSABS 1.0 1.2  --   MONOABS 0.7 0.8  --   EOSABS 0.2 0.3  --   BASOSABS 0.0 0.0  --     Chemistries   Recent Labs Lab 08/15/15 1609 08/16/15 0241 08/16/15 1500  NA 134* 130* 132*  K 4.1 4.2 4.6  CL 93* 91* 95*  CO2 22 20* 20*  GLUCOSE 86 88 83  BUN 68* 73* 80*  CREATININE 16.24* 16.75* 17.49*  CALCIUM 8.1* 7.7* 7.7*  AST 24 16  --   ALT 17 16  --   ALKPHOS 106 94  --   BILITOT 1.2 1.0  --    ------------------------------------------------------------------------------------------------------------------ estimated creatinine clearance is 4.9 mL/min (by C-G formula based on Cr of 17.49). ------------------------------------------------------------------------------------------------------------------ No results for input(s): HGBA1C in the last 72 hours. ------------------------------------------------------------------------------------------------------------------ No results for input(s): CHOL, HDL, LDLCALC, TRIG, CHOLHDL, LDLDIRECT in the last 72 hours. ------------------------------------------------------------------------------------------------------------------ No results for input(s): TSH, T4TOTAL,  T3FREE, THYROIDAB in the last 72 hours.  Invalid input(s): FREET3 ------------------------------------------------------------------------------------------------------------------ No results for input(s): VITAMINB12, FOLATE, FERRITIN, TIBC, IRON, RETICCTPCT in the last 72 hours.  Coagulation profile No results for input(s): INR, PROTIME in the last 168 hours.  No results for input(s): DDIMER in the last 72 hours.  Cardiac Enzymes No results for input(s): CKMB, TROPONINI, MYOGLOBIN in the last 168 hours.  Invalid input(s): CK ------------------------------------------------------------------------------------------------------------------ Invalid input(s): POCBNP     Time Spent in minutes   30 minutes    Jashae Wiggs,  Franki Stemen M.D on 08/17/2015 at 11:22 AM  Between 7am to 7pm - Pager - [phone]  After 7pm go to [email or web address] - password [facility]  [facility]   Office  [phone]

## 2015-08-17 NOTE — Progress Notes (Signed)
Daily Rounding Note  08/17/2015, 9:54 AM  LOS: 2 days   SUBJECTIVE:       Dialysis graft has clotted and she will need intervention for this.  Dialysis postponed until tomorrow afternoon.  Still has abdominal pain, somewhat improved but still needing meds.  Some nausea.  On clears.   OBJECTIVE:         Vital signs in last 24 hours:    Temp:  [97.9 F (36.6 C)-98.2 F (36.8 C)] 98.2 F (36.8 C) (09/01 0918) Pulse Rate:  [70-91] 91 (09/01 0918) Resp:  [16-19] 18 (09/01 0918) BP: (77-104)/(40-65) 100/40 mmHg (09/01 0918) SpO2:  [93 %-100 %] 94 % (09/01 0918) Weight:  [241 lb 13.5 oz (109.7 kg)-248 lb 14.4 oz (112.9 kg)] 241 lb 13.5 oz (109.7 kg) (08/31 1802) Last BM Date: 08/17/15 Filed Weights   08/15/15 2256 08/16/15 1500 08/16/15 1802  Weight: 247 lb 9.2 oz (112.3 kg) 248 lb 14.4 oz (112.9 kg) 241 lb 13.5 oz (109.7 kg)   General: pleasant, NAD   Heart: RRR.  No mrg Chest: clear bil.  No dyspnea Abdomen: soft, active BS.  Tender on right, no guard or rebound.   Extremities: no CCE.  Dialysis access on upper right arm Neuro/Psych:  Pleasant, oriented x 3.  Calm.    Intake/Output from previous day: 08/31 0701 - 09/01 0700 In: 963 [P.O.:960; I.V.:3] Out: 2992   Intake/Output this shift:    Lab Results:  Recent Labs  08/15/15 1609 08/16/15 0241 08/16/15 1500  WBC 10.3 10.2 9.0  HGB 12.3 10.7* 10.3*  HCT 37.7 32.9* 31.1*  PLT 185 172 210   BMET  Recent Labs  08/15/15 1609 08/16/15 0241 08/16/15 1500  NA 134* 130* 132*  K 4.1 4.2 4.6  CL 93* 91* 95*  CO2 22 20* 20*  GLUCOSE 86 88 83  BUN 68* 73* 80*  CREATININE 16.24* 16.75* 17.49*  CALCIUM 8.1* 7.7* 7.7*   LFT  Recent Labs  08/15/15 1609 08/16/15 0241 08/16/15 1500  PROT 7.2 6.5  --   ALBUMIN 3.8 3.3* 3.1*  AST 24 16  --   ALT 17 16  --   ALKPHOS 106 94  --   BILITOT 1.2 1.0  --     Studies/Results: Ct Abdomen Pelvis Wo  Contrast  08/15/2015   CLINICAL DATA:  Generalized abdominal pain, chronic but worsening over the past several days  EXAM: CT ABDOMEN AND PELVIS WITHOUT CONTRAST  TECHNIQUE: Multidetector CT imaging of the abdomen and pelvis was performed following the standard protocol without IV contrast.  COMPARISON:  09/30/2014  FINDINGS: Lower chest: Left greater than right patchy dependent bibasilar consolidation is noted. Trace pleural effusions. Early pneumonia could appear similar to atelectasis.  Hepatobiliary: Unenhanced liver and gallbladder appear normal.  Pancreas: Normal  Spleen: Normal  Adrenals/Urinary Tract: Bilateral renal atrophy with low-density renal cortical cysts reidentified. No hydroureteronephrosis. No radiopaque renal or ureteral calculus. The bladder is decompressed.  Stomach/Bowel: There is short segmental terminal ileal wall thickening with mild surrounding stranding and abnormal hypodense thickening of this cecum wall. No pericolonic fluid collection to suggest abscess.  Vascular/Lymphatic: Vascular collaterals are present within the anterior abdominal wall. No aortic aneurysm. No lymphadenopathy. Lower extremity vascular bypass is partly visualized.  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 Uterus and ovaries appear unremarkable.  Musculoskeletal: Bilateral L5 pars interarticularis defects. No acute osseous abnormality.  IMPRESSION: Interval increase in terminal ileal wall  thickening with contiguous abnormal cecal mural low density most likely indicating inflammatory bowel disease such as Crohn's disease. Colitis/enteritis could appear similar. The appearance is much less typical for ischemia. Lymphoma could appear similar given the presumed previous history of immunosuppression but is less likely.  Probable dependent bibasilar atelectasis. Early pneumonia could appear similar.   Electronically Signed   By: Conchita Paris M.D.   On: 08/15/2015 21:18    ASSESMENT:   *  Abdominal pain with dark stool. Terminal ileal/cecal inflammation. Rule out IBD/Crohn's. Though atypical location for ischemic effect, given her chronic hypotension and HD requirement, ischemic insult also a possiblity. Less likely infectious etiology but admitting MD initiated broad spectrum abx   * ESRD. Transplanted 2000, failed 2007. On [facility] list at [facility]. HD postoponed to tomorrow due to clotted graft.   * Anemia. Normocytic. Requires periodic parenteral iron given at HD. Says she has not been receiving epogen like infusions in several months.   * Hyponatremia  * Chronic Plavix for hx vascular thrombosis. Last dose was 8/29.   Her dialysis catheter has clotted.  IR will address today.    PLAN   *  Slot for colonoscopy 9/2 ~ 0945. Added Reglan to prep orders.    Azucena Freed  08/17/2015, 9:54 AM Pager: [phone]     Attending physician's note   I have taken an interval history, reviewed the chart and examined the patient. I agree with the Advanced Practitioner's note, impression and recommendations. Colonoscopy tomorrow for evaluation of TI/cecal wall thickening and anemia with Hb=10.3 today. Continue to hold Plavix.   Pricilla Riffle. Fuller Plan, MD [facility] [phone] pager Mon-Fri 8a-5p [phone] weekends, holidays and 5p-8a or per [facility]

## 2015-08-18 ENCOUNTER — Inpatient Hospital Stay (HOSPITAL_COMMUNITY): Payer: Medicare Other | Admitting: Anesthesiology

## 2015-08-18 ENCOUNTER — Inpatient Hospital Stay (HOSPITAL_COMMUNITY): Payer: Medicare Other

## 2015-08-18 ENCOUNTER — Encounter (HOSPITAL_COMMUNITY): Payer: Self-pay | Admitting: *Deleted

## 2015-08-18 ENCOUNTER — Encounter (HOSPITAL_COMMUNITY)
Admission: EM | Disposition: A | Discharge status: Home / Self Care | Payer: Self-pay | Source: Home / Self Care | Attending: Internal Medicine

## 2015-08-18 HISTORY — PX: COLONOSCOPY: SHX5424

## 2015-08-18 LAB — BASIC METABOLIC PANEL
Anion gap: 19 — ABNORMAL HIGH (ref 5–15)
BUN: 40 mg/dL — ABNORMAL HIGH (ref 6–20)
CO2: 22 mmol/L (ref 22–32)
Calcium: 8.2 mg/dL — ABNORMAL LOW (ref 8.9–10.3)
Chloride: 97 mmol/L — ABNORMAL LOW (ref 101–111)
Creatinine, Ser: 15.2 mg/dL — ABNORMAL HIGH (ref 0.44–1.00)
GFR calc Af Amer: 3 mL/min — ABNORMAL LOW (ref >60)
GFR calc non Af Amer: 2 mL/min — ABNORMAL LOW (ref >60)
Glucose, Bld: 88 mg/dL (ref 65–99)
Potassium: 4 mmol/L (ref 3.5–5.1)
Sodium: 138 mmol/L (ref 135–145)

## 2015-08-18 LAB — GLUCOSE, CAPILLARY
GLUCOSE-CAPILLARY: 99 mg/dL (ref 65–99)
GLUCOSE-CAPILLARY: 88 mg/dL (ref 65–99)
Glucose-Capillary: 76 mg/dL (ref 65–99)
Glucose-Capillary: 83 mg/dL (ref 65–99)
Glucose-Capillary: 88 mg/dL (ref 65–99)
Glucose-Capillary: 98 mg/dL (ref 65–99)

## 2015-08-18 LAB — CBC
HCT: 32.2 % — ABNORMAL LOW (ref 36.0–46.0)
HEMOGLOBIN: 10.2 g/dL — AB (ref 12.0–15.0)
MCH: 31 pg (ref 26.0–34.0)
MCHC: 31.7 g/dL (ref 30.0–36.0)
MCV: 97.9 fL (ref 78.0–100.0)
PLATELETS: 252 10*3/uL (ref 150–400)
RBC: 3.29 MIL/uL — AB (ref 3.87–5.11)
RDW: 15.4 % (ref 11.5–15.5)
WBC: 7.3 10*3/uL (ref 4.0–10.5)

## 2015-08-18 SURGERY — COLONOSCOPY
Anesthesia: Monitor Anesthesia Care

## 2015-08-18 MED ORDER — MIDAZOLAM HCL 2 MG/2ML IJ SOLN
INTRAMUSCULAR | Status: AC
Start: 2015-08-18 — End: 2015-08-19
  Filled 2015-08-18: qty 4

## 2015-08-18 MED ORDER — SODIUM CHLORIDE 0.9 % IV SOLN
INTRAVENOUS | Status: DC
  Administered 2015-08-18: 1000 mL via INTRAVENOUS

## 2015-08-18 MED ORDER — IOHEXOL 300 MG/ML  SOLN
100.0000 mL | Freq: Once | INTRAMUSCULAR | Status: DC | PRN
  Administered 2015-08-18: 50 mL via INTRAVENOUS
  Filled 2015-08-18: qty 100

## 2015-08-18 MED ORDER — FENTANYL CITRATE (PF) 100 MCG/2ML IJ SOLN
INTRAMUSCULAR | Status: AC
  Filled 2015-08-18: qty 4

## 2015-08-18 MED ORDER — PROPOFOL INFUSION 10 MG/ML OPTIME
INTRAVENOUS | Status: DC | PRN
  Administered 2015-08-18: 200 ug/kg/min via INTRAVENOUS

## 2015-08-18 MED ORDER — LIDOCAINE HCL (CARDIAC) 20 MG/ML IV SOLN
INTRAVENOUS | Status: DC | PRN
  Administered 2015-08-18: 80 mg via INTRAVENOUS

## 2015-08-18 MED ORDER — HEPARIN SODIUM (PORCINE) 1000 UNIT/ML IJ SOLN
INTRAMUSCULAR | Status: AC | PRN
  Administered 2015-08-18: 5000 [IU] via INTRAVENOUS

## 2015-08-18 MED ORDER — MIDAZOLAM HCL 2 MG/2ML IJ SOLN
INTRAMUSCULAR | Status: AC | PRN
  Administered 2015-08-18: 1 mg via INTRAVENOUS
  Administered 2015-08-18: 0.5 mg via INTRAVENOUS

## 2015-08-18 MED ORDER — FENTANYL CITRATE (PF) 100 MCG/2ML IJ SOLN
INTRAMUSCULAR | Status: AC | PRN
  Administered 2015-08-18: 50 ug via INTRAVENOUS
  Administered 2015-08-18: 25 ug via INTRAVENOUS

## 2015-08-18 MED ORDER — ALTEPLASE 100 MG IV SOLR
4.0000 mg | Freq: Once | INTRAVENOUS | Status: AC
  Administered 2015-08-18: 4 mg
  Filled 2015-08-18: qty 4

## 2015-08-18 MED ORDER — PHENYLEPHRINE HCL 10 MG/ML IJ SOLN
INTRAMUSCULAR | Status: DC | PRN
  Administered 2015-08-18: 80 ug via INTRAVENOUS
  Administered 2015-08-18 (×2): 120 ug via INTRAVENOUS

## 2015-08-18 MED ORDER — LIDOCAINE HCL 1 % IJ SOLN
INTRAMUSCULAR | Status: AC
  Filled 2015-08-18: qty 20

## 2015-08-18 MED ORDER — HEPARIN SODIUM (PORCINE) 1000 UNIT/ML IJ SOLN
INTRAMUSCULAR | Status: AC
  Filled 2015-08-18: qty 1

## 2015-08-18 NOTE — Anesthesia Postprocedure Evaluation (Signed)
  Anesthesia Post-op Note  Patient: Judy Green  Procedure(s) Performed: Procedure(s): COLONOSCOPY (N/A)  Patient Location: PACU  Anesthesia Type:MAC  Level of Consciousness: awake  Airway and Oxygen Therapy: Patient Spontanous Breathing  Post-op Pain: mild  Post-op Assessment: Post-op Vital signs reviewed              Post-op Vital Signs: Reviewed  Last Vitals:  Filed Vitals:   08/18/15 1153  BP: 112/58  Pulse: 81  Temp:   Resp:     Complications: No apparent anesthesia complications

## 2015-08-18 NOTE — Sedation Documentation (Signed)
Patient denies pain and is resting comfortably.  

## 2015-08-18 NOTE — Care Management Important Message (Signed)
Important Message  Patient Details  Name: Judy Green MRN: [id number] Date of Birth: 08-20-1966   Medicare Important Message Given:  Yes-second notification given    Delorse Lek 08/18/2015, 10:21 AM

## 2015-08-18 NOTE — Transfer of Care (Signed)
Immediate Anesthesia Transfer of Care Note  Patient: Judy Green  Procedure(s) Performed: Procedure(s): COLONOSCOPY (N/A)  Patient Location: Endoscopy Unit  Anesthesia Type:MAC  Level of Consciousness: sedated and responds to stimulation  Airway & Oxygen Therapy: Patient Spontanous Breathing and Patient connected to face mask oxygen  Post-op Assessment: Report given to RN, Post -op Vital signs reviewed and stable and Patient moving all extremities  Post vital signs: Reviewed and stable  Last Vitals:  Filed Vitals:   08/18/15 0844  BP: 95/51  Pulse:   Temp: 36.7 C  Resp: 15    Complications: No apparent anesthesia complications

## 2015-08-18 NOTE — Procedures (Signed)
I have seen and examined this patient and agree with the plan of care   Patient doing well with dialysis  K 4.0   Hb 10.2  Judy Green W 08/18/2015, 6:16 PM

## 2015-08-18 NOTE — Anesthesia Preprocedure Evaluation (Signed)
Anesthesia Evaluation  Patient identified by MRN, date of birth, ID band Patient awake    Reviewed: Allergy & Precautions, NPO status , Patient's Chart, lab work & pertinent test results  Airway Mallampati: II  TM Distance: >3 FB Neck ROM: Full    Dental   Pulmonary sleep apnea , former smoker,  breath sounds clear to auscultation        Cardiovascular hypertension, Rhythm:Regular Rate:Normal     Neuro/Psych    GI/Hepatic negative GI ROS, Neg liver ROS,   Endo/Other    Renal/GU Renal disease     Musculoskeletal   Abdominal   Peds  Hematology   Anesthesia Other Findings   Reproductive/Obstetrics                             Anesthesia Physical Anesthesia Plan  ASA: II  Anesthesia Plan: MAC   Post-op Pain Management:    Induction: Intravenous  Airway Management Planned: Nasal Cannula  Additional Equipment:   Intra-op Plan:   Post-operative Plan:   Informed Consent: I have reviewed the patients History and Physical, chart, labs and discussed the procedure including the risks, benefits and alternatives for the proposed anesthesia with the patient or authorized representative who has indicated his/her understanding and acceptance.   Dental advisory given  Plan Discussed with: CRNA and Anesthesiologist  Anesthesia Plan Comments:         Anesthesia Quick Evaluation

## 2015-08-18 NOTE — Interval H&P Note (Signed)
History and Physical Interval Note:  08/18/2015 10:02 AM  Judy Green  has presented today for surgery, with the diagnosis of Cecal and ileal inflammation on CT scan, abdominal pain and dark stools.  The various methods of treatment have been discussed with the patient and family. After consideration of risks, benefits and other options for treatment, the patient has consented to  Procedure(s): COLONOSCOPY (N/A) as a surgical intervention .  The patient's history has been reviewed, patient examined, no change in status, stable for surgery.  I have reviewed the patient's chart and labs.  Questions were answered to the patient's satisfaction.     Pricilla Riffle. Fuller Plan

## 2015-08-18 NOTE — Consult Note (Signed)
Judy Green Jun 20, 1966  [id number].   Primary Care MD: Dr. Seward Carol Requesting MD: Dr. Lucio Edward Chief Complaint/Reason for Consult: cecal mass HPI: This is a 49 yo black female who has a h/o ESRD secondary to HTN.  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  She has not been on anti-rejection medications or prednisone since then.  In the interim she has had breast cancer and undergone a left mastectomy as well as radiation.  Dr. Alvy Bimler is her oncologist.    Ever since she had her transplant she has intermittently had horrible crampy abdominal pain after having a good bowel movement.  This has worsened over the last 5-6 years.  Occasionally, she would noticed some blood present.  She has seen her primary GI doctor, Dr. Owens Loffler.  She has had colonoscopies before that were benign.  She normally takes tylenol and goes about her business.  She was at the beach [place] days ago and after having a BM had this same RLQ horrible abdominal pain.  This time it did not go away and was a little bit different than before.  She noticed her stool was very dark, but no frank red blood.  She immediately drove back and came straight to [facility].  She had a CT scan that revealed TI wall thickening contiguous with the cecum, concerning for crohn's disease, colitis/enteritis, or lymphoma which was felt least likely.  She was admitted and GI was consulted.  She had a colonoscopy today and is found to have a near obstructing mass at her IVC.  It does not frankly look malignant, but does not appear normal either.  Multiple biopsies were taken.  She denies any nausea, vomiting, night sweats, or unintentional weight loss.  We have been asked to evaluate this patient for further recommendations.  ROS : Please see HPI, otherwise all other systems are currently negative except she has a clotted off dialysis graft right now and has missed several sessions of dialysis.  Family  History  Problem Relation Age of Onset  . Diabetes Mother   . Diabetes Maternal Aunt     x 3  . Colon cancer Maternal Uncle   . Heart disease Maternal Grandmother   . Diabetes Maternal Grandmother   . Stroke Maternal Grandmother   . Hypertension Brother   . Hypertension Brother   . Hypertension Brother   . Prostate cancer Maternal Uncle     Past Medical History  Diagnosis Date  . 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 Went back on HD Aug 2007 and remains on HD at [facility] on TTS schedule. Just got back on Tx list as of fall 2015.   Marland Kitchen Rectal bleeding 2007    hemorrhoids/diverticular disease  . Retroperitoneal hematoma 9/07    transfusion  . H/O parathyroidectomy 1999  . UTI (lower urinary tract infection)   . Adrenal insufficiency 2009  . Sleep apnea     does not have cpap at this time  . Oral contraceptive use     patient states has not had menstral cycle in 1 year.  . Hypertension     Dr. Seward Carol [phone]  . Chronic headaches   . H/O syncope   . Breast cancer     RT  . Anemia     hx of  . History of multiple failed  HD accesses 09/19/2014    Has had multiple failed HD accesses, including but not limited to LUA AVF, RUA AVG, two LUA HeRO accesses, L thigh AVG, R thigh AVG and two RUA HeRO accesses.  As of fall 2015 her 2nd RUA HeRO occluded and attempt to salvage is pending. Access work is mainly done in [place] with [facility] also involved.    . Chronic hypotension 07/22/2012    Past Surgical History  Procedure Laterality Date  . Av fistula placement Right 05/08/2007  . Hero avg Left 12/14/2008  . Hero  04/17/2009    Removed clotted Left Hero  2) New Right Hero placed and Stent to SVC  . Mastectomy Left   . Exploration renal s/p transplantation    . Tubal ligation    . Avgg removal  08/07/2012    Procedure: REMOVAL OF ARTERIOVENOUS GORETEX GRAFT (Nectar);  Surgeon: Rosetta Posner, MD;   Location: [facility] OR;  Service: Vascular;  Laterality: Right;  . Right lower extremity vascular bypass      Social History:  reports that she quit smoking about 12 years ago. She has never used smokeless tobacco. She reports that she drinks alcohol. She reports that she does not use illicit drugs.  Allergies: No Known Allergies  Medications Prior to Admission  Medication Sig Dispense Refill  . aspirin EC 325 MG tablet Take 325 mg by mouth daily.    . Calcium Acetate 667 MG TABS Take 1,334-2,001 mg by mouth 3 (three) times daily with meals. Take 2001 mg by mouth 3 times daily with meals. Take 1334 mg by mouth twice daily with snacks or deserts.    . clopidogrel (PLAVIX) 75 MG tablet Take 75 mg by mouth daily.      . fludrocortisone (FLORINEF) 0.1 MG tablet Take 2 tablets (0.2 mg total) by mouth daily. 60 tablet 0  . gabapentin (NEURONTIN) 100 MG capsule Take 100 mg by mouth at bedtime.     . midodrine (PROAMATINE) 10 MG tablet Take 1 tablet (10 mg total) by mouth 3 (three) times daily. 30 tablet 1  . norethindrone (MICRONOR,CAMILA,ERRIN) 0.35 MG tablet Take 1 tablet (0.35 mg total) by mouth daily. 1 Package 5  . SENSIPAR 30 MG tablet Take 30 mg by mouth daily.     . cyclobenzaprine (FLEXERIL) 5 MG tablet Take 1 tablet (5 mg total) by mouth 3 (three) times daily as needed for muscle spasms. (Patient not taking: Reported on 11/23/2014) 15 tablet 0    Blood pressure 112/58, pulse 81, temperature 98 F (36.7 C), temperature source Oral, resp. rate 13, height 5' 7"  (1.702 m), weight 110.043 kg (242 lb 9.6 oz), SpO2 99 %. Physical Exam: General: pleasant, obese black female who is laying in bed in NAD HEENT: head is normocephalic, atraumatic.  Sclera are noninjected.  PERRL.  Ears and nose without any masses or lesions.  Mouth is pink and moist Heart: regular, rate, and rhythm.  Normal s1,s2. No obvious murmurs, gallops, or rubs noted.  Palpable radial and pedal pulses bilaterally Lungs: CTAB, no  wheezes, rhonchi, or rales noted.  Respiratory effort nonlabored Abd: soft, tender along her right side, ND, but obese +BS, no masses, hernias, or organomegaly.  Her renal transplant scar is in her RLQ, but her kidney is not palpable and has likely deteriorated. MS: all 4 extremities are symmetrical with no cyanosis, clubbing, or edema.  She does have an IV in her left foot.  She also has a dialysis graft in  her right forearm that is nonfunctioning as well as one in her right upper arm, but no thrill is present Skin: warm and dry with no masses, lesions, or rashes Psych: A&Ox3 with an appropriate affect.    Results for orders placed or performed during the hospital encounter of 08/15/15 (from the past 48 hour(s))  Glucose, capillary     Status: Abnormal   Collection Time: 08/16/15  6:49 PM  Result Value Ref Range   Glucose-Capillary 53 (L) 65 - 99 mg/dL  Glucose, capillary     Status: Abnormal   Collection Time: 08/16/15  8:03 PM  Result Value Ref Range   Glucose-Capillary 118 (H) 65 - 99 mg/dL  Glucose, capillary     Status: None   Collection Time: 08/17/15 12:09 AM  Result Value Ref Range   Glucose-Capillary 81 65 - 99 mg/dL  Glucose, capillary     Status: None   Collection Time: 08/17/15  4:04 AM  Result Value Ref Range   Glucose-Capillary 92 65 - 99 mg/dL  Glucose, capillary     Status: None   Collection Time: 08/17/15  7:39 AM  Result Value Ref Range   Glucose-Capillary 94 65 - 99 mg/dL  Protime-INR     Status: Abnormal   Collection Time: 08/17/15 11:20 AM  Result Value Ref Range   Prothrombin Time 17.7 (H) 11.6 - 15.2 seconds   INR 1.45 0.00 - 1.49  Glucose, capillary     Status: None   Collection Time: 08/17/15 11:32 AM  Result Value Ref Range   Glucose-Capillary 84 65 - 99 mg/dL  Glucose, capillary     Status: None   Collection Time: 08/17/15  4:35 PM  Result Value Ref Range   Glucose-Capillary 90 65 - 99 mg/dL  Glucose, capillary     Status: None   Collection Time:  08/17/15  8:01 PM  Result Value Ref Range   Glucose-Capillary 89 65 - 99 mg/dL  Glucose, capillary     Status: None   Collection Time: 08/18/15 12:03 AM  Result Value Ref Range   Glucose-Capillary 88 65 - 99 mg/dL  Glucose, capillary     Status: None   Collection Time: 08/18/15  4:16 AM  Result Value Ref Range   Glucose-Capillary 76 65 - 99 mg/dL  Basic metabolic panel     Status: Abnormal   Collection Time: 08/18/15  5:18 AM  Result Value Ref Range   Sodium 138 135 - 145 mmol/L   Potassium 4.0 3.5 - 5.1 mmol/L   Chloride 97 (L) 101 - 111 mmol/L   CO2 22 22 - 32 mmol/L   Glucose, Bld 88 65 - 99 mg/dL   BUN 40 (H) 6 - 20 mg/dL   Creatinine, Ser 15.20 (H) 0.44 - 1.00 mg/dL   Calcium 8.2 (L) 8.9 - 10.3 mg/dL   GFR calc non Af Amer 2 (L) >60 mL/min   GFR calc Af Amer 3 (L) >60 mL/min    Comment: (NOTE) The eGFR has been calculated using the CKD EPI equation. This calculation has not been validated in all clinical situations. eGFR's persistently <60 mL/min signify possible Chronic Kidney Disease.    Anion gap 19 (H) 5 - 15  CBC     Status: Abnormal   Collection Time: 08/18/15  5:18 AM  Result Value Ref Range   WBC 7.3 4.0 - 10.5 K/uL   RBC 3.29 (L) 3.87 - 5.11 MIL/uL   Hemoglobin 10.2 (L) 12.0 - 15.0 g/dL  HCT 32.2 (L) 36.0 - 46.0 %   MCV 97.9 78.0 - 100.0 fL   MCH 31.0 26.0 - 34.0 pg   MCHC 31.7 30.0 - 36.0 g/dL   RDW 15.4 11.5 - 15.5 %   Platelets 252 150 - 400 K/uL  Glucose, capillary     Status: None   Collection Time: 08/18/15  7:22 AM  Result Value Ref Range   Glucose-Capillary 99 65 - 99 mg/dL  Glucose, capillary     Status: None   Collection Time: 08/18/15 11:27 AM  Result Value Ref Range   Glucose-Capillary 88 65 - 99 mg/dL   No results found.     Assessment/Plan 1. Colon mass, cecum -it is unclear whether this is a malignant or benign near obstructing mass of her colon.  She will need this area resected regardless of pathology.  I will have to discuss  with the oncoming surgeons this weekend whether they feel as if they need her pathology back before proceeding with an operation or if they would feel comfortable to go ahead and proceed, sans pathology.  I will check a CEA level for a baseline incase. -cont on clear liquids for now so she does not have to be repreped.  -I have discussed this with the patient.  She understands and is agreeable to proceed. -she does take plavix and this has been on hold.  Cont to hold this for now, surgery pending -given her missed sessions of HD, she will need HD prior to surgery this weekend if she is unable to get it today due to her clotted graft. -thank you for this consultation.  We will follow along.  Haik Mahoney E 08/18/2015, 4:02 PM Pager: [phone]

## 2015-08-18 NOTE — Progress Notes (Signed)
Called by Dr. Tillman Sers with pathology results on recent colonoscopy with bx --Dx: ulcerated ileocecal valve tissue, most consistent with ischemic injury.  Less likely infectious, but this cannot be excluded from the differential diagnosis.  No evidence of dysplasia or malignancy.  Not consistent with IBD. --I have assumed care from Dr. Fuller Plan and have not yet seen the patient.  Will followup tomorrow.  Care with be supportive from GI standpoint. --Pathology result was called directly to me and will not be finalized or available in the EMR until Tuesday, 08/22/2015

## 2015-08-18 NOTE — Progress Notes (Addendum)
Patient Demographics  Judy Green, is a 49 y.o. female, DOB - Sep 09, 1966, PYP:[id number]  Admit date - 08/15/2015   Admitting Physician Judy Patience, MD  Outpatient Primary MD for the patient is Judy Hams, MD  LOS - 3   Chief Complaint  Patient presents with  . Abdominal Pain         Subjective:   Judy Green today has, No headache, No chest pain, No new weakness tingling or numbness, No Cough - SOB, reports abd pain has resolved.  Assessment & Plan    Principal Problem:   Colitis Active Problems:   ESRD on hemodialysis   Chronic hypotension   Abnormal CT scan, gastrointestinal tract   Anemia in chronic kidney disease  Abdominal pain and diarrhea - CT abdomen and pelvis showing colitis involving cecum and terminal ileal area. - Abdominal pain significantly improved, tolerating clear liquid diet, advance to renal diet today. - GI consult appreciated, colonoscopy 08/18/15, finding significant for ulcerated cecal mass, biopsies were sent. Will discuss further plan with GI. - C. difficile negative   Questionable pneumonia on imaging - CT abdomen showing bibasilar infiltrate/atelectasis, no clinical evidence of pneumonia, stopped IV vancomycin and Zosyn.  Chronic hypotension - Continue with fludrocortisone and midodrine  End-stage renal disease - Nephrology consulted - On TTS schedule, missed Tuesday, last Wednesday, could not be dialyzed yesterday secondary to clotted graft(HERO), plan is for dialysis today after declot.  History of failed renal transplant  Anemia -  related to renal disease  Code Status: Full  Family Communication: Discussed with patient, none at bedside  Disposition Plan: Home when stable   Procedures  none   Consults   Renal GI   Medications  Scheduled Meds: . alteplase  4 mg Intracatheter Once  . aspirin EC  325 mg  Oral Daily  . calcitRIOL  0.75 mcg Oral Q T,Th,Sa-HD  . calcium acetate  2,001 mg Oral TID WC  . darbepoetin (ARANESP) injection - DIALYSIS  60 mcg Intravenous Q Fri-HD  . [START ON 09/07/2015] darbepoetin (ARANESP) injection - DIALYSIS  60 mcg Intravenous Q Thu-HD  . ferric gluconate (FERRLECIT/NULECIT) IV  125 mg Intravenous Q T,Th,Sa-HD  . fludrocortisone  0.2 mg Oral Daily  . gabapentin  100 mg Oral QHS  . midodrine  10 mg Oral TID WC  . sodium chloride  3 mL Intravenous Q12H   Continuous Infusions: . sodium chloride 1,000 mL (08/18/15 1001)   PRN Meds:.sodium chloride, sodium chloride, acetaminophen **OR** acetaminophen, alteplase, calcium acetate, fentaNYL (SUBLIMAZE) injection, heparin, heparin, lidocaine (PF), lidocaine-prilocaine, ondansetron **OR** ondansetron (ZOFRAN) IV, pentafluoroprop-tetrafluoroeth  DVT Prophylaxis   Heparin -  Lab Results  Component Value Date   PLT 252 08/18/2015    Antibiotics    Anti-infectives    Start     Dose/Rate Route Frequency Ordered Stop   08/17/15 2000  ciprofloxacin (CIPRO) IVPB 400 mg  Status:  Discontinued     400 mg 200 mL/hr over 60 Minutes Intravenous Every 24 hours 08/17/15 1007 08/17/15 1437   08/17/15 1200  vancomycin (VANCOCIN) IVPB 1000 mg/200 mL premix  Status:  Discontinued     1,000 mg 200 mL/hr over 60 Minutes Intravenous Every T-Th-Sa (Hemodialysis) 08/16/15 0646 08/16/15 1220   08/17/15  1200  metroNIDAZOLE (FLAGYL) IVPB 500 mg  Status:  Discontinued     500 mg 100 mL/hr over 60 Minutes Intravenous Every 8 hours 08/17/15 1007 08/17/15 1437   08/16/15 2200  ciprofloxacin (CIPRO) IVPB 400 mg  Status:  Discontinued     400 mg 200 mL/hr over 60 Minutes Intravenous Every 24 hours 08/15/15 2329 08/16/15 0825   08/16/15 1800  ciprofloxacin (CIPRO) IVPB 400 mg  Status:  Discontinued     400 mg 200 mL/hr over 60 Minutes Intravenous Every 24 hours 08/16/15 1300 08/16/15 1605   08/16/15 1300  metroNIDAZOLE (FLAGYL) IVPB 500 mg   Status:  Discontinued     500 mg 100 mL/hr over 60 Minutes Intravenous Every 8 hours 08/16/15 1221 08/16/15 1605   08/16/15 0800  piperacillin-tazobactam (ZOSYN) IVPB 2.25 g  Status:  Discontinued     2.25 g 100 mL/hr over 30 Minutes Intravenous Every 8 hours 08/16/15 0645 08/16/15 1220   08/16/15 0700  vancomycin (VANCOCIN) 2,500 mg in sodium chloride 0.9 % 500 mL IVPB     2,500 mg 250 mL/hr over 120 Minutes Intravenous  Once 08/16/15 0645 08/16/15 0941   08/16/15 0600  metroNIDAZOLE (FLAGYL) IVPB 500 mg  Status:  Discontinued     500 mg 100 mL/hr over 60 Minutes Intravenous Every 8 hours 08/15/15 2310 08/16/15 0636   08/15/15 2130  ciprofloxacin (CIPRO) IVPB 400 mg     400 mg 200 mL/hr over 60 Minutes Intravenous  Once 08/15/15 2123 08/15/15 2317   08/15/15 2130  metroNIDAZOLE (FLAGYL) IVPB 500 mg     500 mg 100 mL/hr over 60 Minutes Intravenous  Once 08/15/15 2123 08/15/15 2317          Objective:   Filed Vitals:   08/18/15 1055 08/18/15 1100 08/18/15 1105 08/18/15 1153  BP: 89/43 87/47 91/45  112/58  Pulse: 79 81 81 81  Temp:      TempSrc:      Resp: 22 24 13    Height:      Weight:      SpO2: 100% 100% 99%     Wt Readings from Last 3 Encounters:  08/17/15 110.043 kg (242 lb 9.6 oz)  09/23/14 112 kg (246 lb 14.6 oz)  09/22/14 113.5 kg (250 lb 3.6 oz)     Intake/Output Summary (Last 24 hours) at 08/18/15 1435 Last data filed at 08/18/15 1037  Gross per 24 hour  Intake    200 ml  Output      0 ml  Net    200 ml     Physical Exam  Awake Alert, Oriented X 3, No new F.N deficits, Normal affect Caspian.AT,PERRAL Supple Neck,No JVD, No cervical lymphadenopathy appriciated.  Symmetrical Chest wall movement, Good air movement bilaterally, CTAB RRR,No Gallops,Rubs or new Murmurs, No Parasternal Heave +ve B.Sounds, Abd Soft, minimal tenderness, No organomegaly appriciated. No Cyanosis, Clubbing or edema, No new Rash or bruise     Data Review   Micro  Results Recent Results (from the past 240 hour(s))  C difficile quick scan w PCR reflex     Status: None   Collection Time: 08/15/15 11:14 PM  Result Value Ref Range Status   C Diff antigen NEGATIVE NEGATIVE Final   C Diff toxin NEGATIVE NEGATIVE Final   C Diff interpretation Negative for toxigenic C. difficile  Final    Radiology Reports Ct Abdomen Pelvis Wo Contrast  08/15/2015   CLINICAL DATA:  Generalized abdominal pain, chronic but worsening over the past  several days  EXAM: CT ABDOMEN AND PELVIS WITHOUT CONTRAST  TECHNIQUE: Multidetector CT imaging of the abdomen and pelvis was performed following the standard protocol without IV contrast.  COMPARISON:  09/30/2014  FINDINGS: Lower chest: Left greater than right patchy dependent bibasilar consolidation is noted. Trace pleural effusions. Early pneumonia could appear similar to atelectasis.  Hepatobiliary: Unenhanced liver and gallbladder appear normal.  Pancreas: Normal  Spleen: Normal  Adrenals/Urinary Tract: Bilateral renal atrophy with low-density renal cortical cysts reidentified. No hydroureteronephrosis. No radiopaque renal or ureteral calculus. The bladder is decompressed.  Stomach/Bowel: There is short segmental terminal ileal wall thickening with mild surrounding stranding and abnormal hypodense thickening of this cecum wall. No pericolonic fluid collection to suggest abscess.  Vascular/Lymphatic: Vascular collaterals are present within the anterior abdominal wall. No aortic aneurysm. No lymphadenopathy. Lower extremity vascular bypass is partly visualized.  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 Uterus and ovaries appear unremarkable.  Musculoskeletal: Bilateral L5 pars interarticularis defects. No acute osseous abnormality.  IMPRESSION: Interval increase in terminal ileal wall thickening with contiguous abnormal cecal mural low density most likely indicating inflammatory bowel disease such as Crohn's disease.  Colitis/enteritis could appear similar. The appearance is much less typical for ischemia. Lymphoma could appear similar given the presumed previous history of immunosuppression but is less likely.  Probable dependent bibasilar atelectasis. Early pneumonia could appear similar.   Electronically Signed   By: Conchita Paris M.D.   On: 08/15/2015 21:18     CBC  Recent Labs Lab 08/15/15 1609 08/16/15 0241 08/16/15 1500 08/18/15 0518  WBC 10.3 10.2 9.0 7.3  HGB 12.3 10.7* 10.3* 10.2*  HCT 37.7 32.9* 31.1* 32.2*  PLT 185 172 210 252  MCV 98.2 96.8 95.1 97.9  MCH 32.0 31.5 31.5 31.0  MCHC 32.6 32.5 33.1 31.7  RDW 16.0* 15.7* 15.4 15.4  LYMPHSABS 1.0 1.2  --   --   MONOABS 0.7 0.8  --   --   EOSABS 0.2 0.3  --   --   BASOSABS 0.0 0.0  --   --     Chemistries   Recent Labs Lab 08/15/15 1609 08/16/15 0241 08/16/15 1500 08/18/15 0518  NA 134* 130* 132* 138  K 4.1 4.2 4.6 4.0  CL 93* 91* 95* 97*  CO2 22 20* 20* 22  GLUCOSE 86 88 83 88  BUN 68* 73* 80* 40*  CREATININE 16.24* 16.75* 17.49* 15.20*  CALCIUM 8.1* 7.7* 7.7* 8.2*  AST 24 16  --   --   ALT 17 16  --   --   ALKPHOS 106 94  --   --   BILITOT 1.2 1.0  --   --    ------------------------------------------------------------------------------------------------------------------ estimated creatinine clearance is 5.6 mL/min (by C-G formula based on Cr of 15.2). ------------------------------------------------------------------------------------------------------------------ No results for input(s): HGBA1C in the last 72 hours. ------------------------------------------------------------------------------------------------------------------ No results for input(s): CHOL, HDL, LDLCALC, TRIG, CHOLHDL, LDLDIRECT in the last 72 hours. ------------------------------------------------------------------------------------------------------------------ No results for input(s): TSH, T4TOTAL, T3FREE, THYROIDAB in the last 72  hours.  Invalid input(s): FREET3 ------------------------------------------------------------------------------------------------------------------ No results for input(s): VITAMINB12, FOLATE, FERRITIN, TIBC, IRON, RETICCTPCT in the last 72 hours.  Coagulation profile  Recent Labs Lab 08/17/15 1120  INR 1.45    No results for input(s): DDIMER in the last 72 hours.  Cardiac Enzymes No results for input(s): CKMB, TROPONINI, MYOGLOBIN in the last 168 hours.  Invalid input(s): CK ------------------------------------------------------------------------------------------------------------------ Invalid input(s): POCBNP     Time Spent in minutes   30  minutes    Ejay Lashley M.D on 08/18/2015 at 2:35 PM  Between 7am to 7pm - Pager - [phone]  After 7pm go to [email or web address] - password [facility]  [facility]   Office  [phone]

## 2015-08-18 NOTE — Op Note (Signed)
[facility] [place], [id number]   COLONOSCOPY PROCEDURE REPORT  PATIENT: Judy Green, Judy Green  MR#: [id number] BIRTHDATE: 28-Jan-1966 , 49  yrs. old GENDER: female ENDOSCOPIST: Ladene Artist, MD, [facility] REFERRED XB:[id number] PROCEDURE DATE:  08/18/2015 PROCEDURE:   Colonoscopy, diagnostic and Colonoscopy with biopsy First Screening Colonoscopy - Avg.  risk and is 50 yrs.  old or older - No.  Prior Negative Screening - Now for repeat screening. N/A  History of Adenoma - Now for follow-up colonoscopy & has been > or = to 3 yrs.  N/A  Polyps removed today? No Recommend repeat exam, <10 yrs? No ASA CLASS:   Class III INDICATIONS:Evaluation of barium enema or other imaging study of an abnormality that is likely to be clinically significant, such as filling defect or stricture and anemia, non-specific. MEDICATIONS: Monitored anesthesia care and Per Anesthesia DESCRIPTION OF PROCEDURE:   After the risks benefits and alternatives of the procedure were thoroughly explained, informed consent was obtained.  The digital rectal exam revealed no abnormalities of the rectum.   The Pentax Ped Colon [id number] endoscope was introduced through the anus and advanced to the cecum, which was identified by the ileocecal valve. No adverse events experienced.   The quality of the prep was excellent. (MoviPrep was used)  The instrument was then slowly withdrawn as the colon was fully examined. Estimated blood loss is zero unless otherwise noted in this procedure report.    COLON FINDINGS: A firm, smooth and ulcerated mass was found at the ileocecal valve and cecum. Unable to advance into the cecum or the TI due to the mass. Multiple biopsies of the lesion were performed. Non-bleeding ulcerations were found in the proximal ascending colon adjancent to the IC valvle. Multiple biopsies were performed. There was mild diverticulosis noted in the sigmoid colon  with associated muscular hypertrophy and petechiae.   The examination was otherwise normal.  Retroflexed views revealed internal Grade I hemorrhoids. The time to cecum = 3.0 Withdrawal time = 13.5   The scope was withdrawn and the procedure completed. COMPLICATIONS: There were no immediate complications.  ENDOSCOPIC IMPRESSION: 1.   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   Ulcerations in the ascending colon; multiple biopsies performed  3.   Mild diverticulosis noted in the sigmoid colon 4.   Grade l internal hemorrhoids  RECOMMENDATIONS: 1.  Await pathology results  eSigned:  Ladene Artist, MD, [facility] 08/18/2015 10:50 AM

## 2015-08-18 NOTE — H&P (View-Only) (Signed)
[facility] Consult: 9:11 AM 08/16/2015  LOS: 1 day    Referring Provider: Dr Waldron Labs  Primary Care Physician:  Kandice Hams, MD Primary Gastroenterologist:  Dr. Ardis Hughs    Reason for Consultation:  Diarrhea, abd pain.    HPI: Judy Green is a 49 y.o. female.  Hx ESRD (TTS dialysis), failed renal transplant.  Chronic hypotension. Anemia of renal failure/chronic disease. Chronic Plavix (last dose 8/29) for problems with dialysis access/vascular clotting.  02/2014 Colonoscopy: for rectal bleeding, abd pain, constipation. Sessile polyp at IC valve (tubular adenoma), int/ext rrhoids.   Has had periodic abdominal pain over 2 to 3 years, treated in ED and no admissions required.  Had routine, uneventful HD on Saturday, Sunday developed pain in abdomen, began in lower mid region but spread across entire abdomen, no radiation to chest or back.  Stool was large, loose, black.  No n/v, fever, chest pain Due to ongoing pain, refractory to Tylenol, she came to ED CT shows inflammation of TI, increased c/w 09/2014 (no mention of inflammation on that CT however). Suspicious for IBD/Chrohns. Started on cipro/flagyl/vanc.  WBCs not elevated.  No fever.     Past Medical History  Diagnosis Date  . 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 Went back on HD Aug 2007 and remains on HD at [facility] on TTS schedule. Just got back on Tx list as of fall 2015.   Marland Kitchen Rectal bleeding 2007    hemorrhoids/diverticular disease  . Retroperitoneal hematoma 9/07    transfusion  . H/O parathyroidectomy 1999  . UTI (lower urinary tract infection)   . Adrenal insufficiency 2009  . Sleep apnea     does not have cpap at this time  . Oral contraceptive use    patient states has not had menstral cycle in 1 year.  . Hypertension     Dr. Seward Carol [phone]  . Chronic headaches   . H/O syncope   . Breast cancer     RT  . Anemia     hx of  . History of multiple failed HD accesses 09/19/2014    Has had multiple failed HD accesses, including but not limited to LUA AVF, RUA AVG, two LUA HeRO accesses, L thigh AVG, R thigh AVG and two RUA HeRO accesses.  As of fall 2015 her 2nd RUA HeRO occluded and attempt to salvage is pending. Access work is mainly done in [place] with [facility] also involved.    . Chronic hypotension 07/22/2012    Past Surgical History  Procedure Laterality Date  . Av fistula placement Right 05/08/2007  . Hero avg Left 12/14/2008  . Hero  04/17/2009    Removed clotted Left Hero  2) New Right Hero placed and Stent to SVC  . Breast lumpectomy Left   . Exploration renal s/p transplantation    . Tubal ligation    . Avgg removal  08/07/2012    Procedure: REMOVAL OF ARTERIOVENOUS GORETEX GRAFT ([place]);  Surgeon: Rosetta Posner, MD;  Location: [facility];  Service: Vascular;  Laterality: Right;    Prior to Admission medications   Medication Sig Start Date End Date Taking? Authorizing Provider  aspirin EC 325 MG tablet Take 325 mg by mouth daily.   Yes Historical Provider, MD  Calcium Acetate 667 MG TABS Take 1,334-2,001 mg by mouth 3 (three) times daily with meals. Take 2001 mg by mouth 3 times daily with meals. Take 1334 mg by mouth twice daily with snacks or deserts.   Yes Historical Provider, MD  clopidogrel (PLAVIX) 75 MG tablet Take 75 mg by mouth daily.     Yes Historical Provider, MD  fludrocortisone (FLORINEF) 0.1 MG tablet Take 2 tablets (0.2 mg total) by mouth daily. 12/12/14  Yes Renato Shin, MD  gabapentin (NEURONTIN) 100 MG capsule Take 100 mg by mouth at bedtime.  11/18/13  Yes Historical Provider, MD  midodrine (PROAMATINE) 10 MG tablet Take 1 tablet (10 mg total) by mouth 3 (three) times daily. 09/06/12  Yes Theodis Blaze, MD  norethindrone (MICRONOR,CAMILA,ERRIN) 0.35 MG tablet Take 1 tablet (0.35 mg total) by mouth daily. 11/23/14  Yes Lahoma Crocker, MD  SENSIPAR 30 MG tablet Take 30 mg by mouth daily.  07/08/12  Yes Historical Provider, MD  cyclobenzaprine (FLEXERIL) 5 MG tablet Take 1 tablet (5 mg total) by mouth 3 (three) times daily as needed for muscle spasms. Patient not taking: Reported on 11/23/2014 09/23/14   Delfina Redwood, MD    Scheduled Meds: . aspirin EC  325 mg Oral Daily  . calcium acetate  2,001 mg Oral TID WC  . clopidogrel  75 mg Oral Daily  . enoxaparin (LOVENOX) injection  30 mg Subcutaneous Q24H  . fludrocortisone  0.2 mg Oral Daily  . gabapentin  100 mg Oral QHS  . midodrine  10 mg Oral TID WC  . piperacillin-tazobactam (ZOSYN)  IV  2.25 g Intravenous Q8H  . sodium chloride  3 mL Intravenous Q12H  . vancomycin  2,500 mg Intravenous Once  . [START ON 08/17/2015] vancomycin  1,000 mg Intravenous Q T,Th,Sa-HD   Infusions:   PRN Meds: acetaminophen **OR** acetaminophen, calcium acetate, fentaNYL (SUBLIMAZE) injection, ondansetron **OR** ondansetron (ZOFRAN) IV   Allergies as of 08/15/2015  . (No Known Allergies)    Family History  Problem Relation Age of Onset  . Diabetes Mother   . Diabetes Maternal Aunt     x 3  . Colon cancer Maternal Uncle   . Heart disease Maternal Grandmother   . Diabetes Maternal Grandmother   . Stroke Maternal Grandmother   . Hypertension Brother   . Hypertension Brother   . Hypertension Brother   . Prostate cancer Maternal Uncle         Hemorrhoids                                    Brother  Social History   Social History  . Marital Status: Married    Spouse Name: N/A  . Number of Children: 2  . Years of Education: N/A   Occupational History  . 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 Smoking status: Former Smoker    Quit date: 12/16/2002  . Smokeless tobacco: Never Used  . Alcohol Use: Yes      Comment: 2-3x/week  . Drug Use: No  . Sexual Activity: Not Currently  Other Topics Concern  . Not on file   Social History Narrative   Regular exercise-yes   Caffeine Use-yes          REVIEW OF SYSTEMS: Constitutional:  Stable weight.  Generally fair level of energy ENT:  No nose bleeds Pulm:  No SOB or cough CV:  No palpitations, no LE edema.  GU:  Anuric.  GI:  Per HPI.  No heartburn, no dysphagia Gyn.  On birth control pills for hot flashes.  Heme:  Periodic parenteral iron infusions, these tend to cause constipation. Last infusion was a few weeks ago Transfusions:  none Neuro:  No headaches, no peripheral tingling or numbness Derm:  Has scalp itching related to hot flashes/sweating.   Endocrine:  No sweats or chills.  No polyuria or dysuria Immunization:  Previous Hepatitis vaccination  Travel:  None beyond local counties in last few months.    PHYSICAL EXAM: Vital signs in last 24 hours: Filed Vitals:   08/16/15 0413  BP: 102/53  Pulse: 97  Temp: 99 F (37.2 C)  Resp: 20   Wt Readings from Last 3 Encounters:  08/15/15 247 lb 9.2 oz (112.3 kg)  09/23/14 246 lb 14.6 oz (112 kg)  09/22/14 250 lb 3.6 oz (113.5 kg)   General: pleasant, comfortable, intelligent. Does not look ill Head:  No asymmetry.  Slightly cushingoid Eyes:  No icterus or pallor Ears:  Not HOH Nose:  No congestion or discharge Mouth:  Clear, moist MM.  Upper partial plate.  Not wearing lower partial plate so many teeth missing Neck:  No jvd, mass or tmg Lungs:  Clear bil.  Nocough or dyspnea.   Heart: RRR.  No mrg.  S1/s2 audible Abdomen:  Soft, difusely tender but no guard or rebound.  BS present, no tinkling or tympanitic BS.  No HSM.  Do not palpate transplanted kidney Rectal: scant, brown stool smear, is FOBT negative.  No mass Musc/Skeltl: no joint contracture, swelling or deformity Extremities:  No CCE Neurologic:  Oriented x 3.  Moves all 4.  Full limb strength.  No tremor or  gross deficits Skin:  No rash, sores or telangectasia Tattoos:  none Nodes:  No cervical adenopathy.  Psych:  Pleasant, relaxed,   Intake/Output from previous day: 08/30 0701 - 08/31 0700 In: 500 [I.V.:500] Out: 100 [Stool:100] Intake/Output this shift:    LAB RESULTS:  Recent Labs  08/15/15 1609 08/16/15 0241  WBC 10.3 10.2  HGB 12.3 10.7*  HCT 37.7 32.9*  PLT 185 172   BMET Lab Results  Component Value Date   NA 130* 08/16/2015   NA 134* 08/15/2015   NA 137 09/23/2014   K 4.2 08/16/2015   K 4.1 08/15/2015   K 5.3* 08/05/2015   CL 91* 08/16/2015   CL 93* 08/15/2015   CL 95* 09/23/2014   CO2 20* 08/16/2015   CO2 22 08/15/2015   CO2 23 09/23/2014   GLUCOSE 88 08/16/2015   GLUCOSE 86 08/15/2015   GLUCOSE 96 09/23/2014   BUN 73* 08/16/2015   BUN 68* 08/15/2015   BUN 35* 09/23/2014   CREATININE 16.75* 08/16/2015   CREATININE 16.24* 08/15/2015   CREATININE 10.01* 09/23/2014   CALCIUM 7.7* 08/16/2015   CALCIUM 8.1* 08/15/2015   CALCIUM 8.8 09/23/2014   LFT  Recent Labs  08/15/15 1609 08/16/15 0241  PROT 7.2 6.5  ALBUMIN 3.8 3.3*  AST 24 16  ALT 17 16  ALKPHOS 106 94  BILITOT 1.2 1.0   PT/INR Lab  Results  Component Value Date   INR 1.16 09/19/2014   INR 1.11 08/07/2012   INR 1.0 06/02/2009   Hepatitis Panel No results for input(s): HEPBSAG, HCVAB, HEPAIGM, HEPBIGM in the last 72 hours. C-Diff No components found for: CDIFF Lipase     Component Value Date/Time   LIPASE 23 08/15/2015 1609    Drugs of Abuse  No results found for: LABOPIA, COCAINSCRNUR, LABBENZ, AMPHETMU, THCU, LABBARB   RADIOLOGY STUDIES: Ct Abdomen Pelvis Wo Contrast  08/15/2015   CLINICAL DATA:  Generalized abdominal pain, chronic but worsening over the past several days  EXAM: CT ABDOMEN AND PELVIS WITHOUT CONTRAST  TECHNIQUE: Multidetector CT imaging of the abdomen and pelvis was performed following the standard protocol without IV contrast.  COMPARISON:  09/30/2014   FINDINGS: Lower chest: Left greater than right patchy dependent bibasilar consolidation is noted. Trace pleural effusions. Early pneumonia could appear similar to atelectasis.  Hepatobiliary: Unenhanced liver and gallbladder appear normal.  Pancreas: Normal  Spleen: Normal  Adrenals/Urinary Tract: Bilateral renal atrophy with low-density renal cortical cysts reidentified. No hydroureteronephrosis. No radiopaque renal or ureteral calculus. The bladder is decompressed.  Stomach/Bowel: There is short segmental terminal ileal wall thickening with mild surrounding stranding and abnormal hypodense thickening of this cecum wall. No pericolonic fluid collection to suggest abscess.  Vascular/Lymphatic: Vascular collaterals are present within the anterior abdominal wall. No aortic aneurysm. No lymphadenopathy. Lower extremity vascular bypass is partly visualized.  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 Uterus and ovaries appear unremarkable.  Musculoskeletal: Bilateral L5 pars interarticularis defects. No acute osseous abnormality.  IMPRESSION: Interval increase in terminal ileal wall thickening with contiguous abnormal cecal mural low density most likely indicating inflammatory bowel disease such as Crohn's disease. Colitis/enteritis could appear similar. The appearance is much less typical for ischemia. Lymphoma could appear similar given the presumed previous history of immunosuppression but is less likely.  Probable dependent bibasilar atelectasis. Early pneumonia could appear similar.   Electronically Signed   By: Conchita Paris M.D.   On: 08/15/2015 21:18    ENDOSCOPIC STUDIES: Per HPI  IMPRESSION:   *  Abdominal pain with dark stool.  Terminal ileal/cecal inflammation.  Rule out IBD/Crohn's.  Though atypical location for ischemic effect, given her chronic hypotension and HD requirement, ischemic insult also a possiblity. Less likely infectious etiology but admitting MD initiated broad  spectrum abx   *  ESRD.  Transplanted 2000, failed 2007.  On transplant list at [facility].   *  Anemia. Normocytic.  Requires periodic parenteral iron given at HD.   Says she has not been receiving epogen like infusions in several months.   *  Hyponatremia  *  Chronic Plavix for hx vascular thrombosis. Last dose was 8/29.     PLAN:     *  ? Colonoscopy with intubation of TI?  Dr Fuller Plan to see pt.   *  Ok to have clears today, though not very hungry.   *  Not clear she needs Cipro/flagyl/vanco (no leukocytosis or fever).  Will leave in place for now.    Azucena Freed  08/16/2015, 9:11 AM Pager: [phone]      Attending physician's note   I have taken a history, examined the patient and reviewed the chart. I agree with the Advanced Practitioner's note, impression and recommendations. Abdominal pain with TI/cecal wall thickening on CT without IV contrast. Diarrhea has improved. Possible ischemic event. Need to R/O IBD/ Crohn's/etc. Schedule colonoscopy for Friday to allow an additional day  for Plavix wash out. Clear liquids for now.   Ladene Artist, MD [facility] [phone] pager Mon-Fri 8a-5p [phone] weekends, holidays and 5p-8a or per [facility]

## 2015-08-19 ENCOUNTER — Inpatient Hospital Stay (HOSPITAL_COMMUNITY): Payer: Medicare Other

## 2015-08-19 DIAGNOSIS — T82868A Thrombosis of vascular prosthetic devices, implants and grafts, initial encounter: Secondary | ICD-10-CM | POA: Insufficient documentation

## 2015-08-19 DIAGNOSIS — K550 Acute vascular disorders of intestine: Principal | ICD-10-CM

## 2015-08-19 DIAGNOSIS — K55039 Acute (reversible) ischemia of large intestine, extent unspecified: Secondary | ICD-10-CM | POA: Insufficient documentation

## 2015-08-19 DIAGNOSIS — N186 End stage renal disease: Secondary | ICD-10-CM | POA: Insufficient documentation

## 2015-08-19 LAB — GLUCOSE, CAPILLARY
GLUCOSE-CAPILLARY: 80 mg/dL (ref 65–99)
Glucose-Capillary: 82 mg/dL (ref 65–99)
Glucose-Capillary: 73 mg/dL (ref 65–99)

## 2015-08-19 LAB — CEA: CEA: 2.8 ng/mL (ref 0.0–4.7)

## 2015-08-19 MED ORDER — SODIUM CHLORIDE 0.9 % IV SOLN
INTRAVENOUS | Status: AC | PRN
  Administered 2015-08-19: 10 mL/h via INTRAVENOUS

## 2015-08-19 MED ORDER — MIDAZOLAM HCL 2 MG/2ML IJ SOLN
INTRAMUSCULAR | Status: AC | PRN
  Administered 2015-08-19 (×2): 1 mg via INTRAVENOUS

## 2015-08-19 MED ORDER — LIDOCAINE-EPINEPHRINE (PF) 1 %-1:200000 IJ SOLN
INTRAMUSCULAR | Status: AC
  Filled 2015-08-19: qty 30

## 2015-08-19 MED ORDER — FENTANYL CITRATE (PF) 100 MCG/2ML IJ SOLN
INTRAMUSCULAR | Status: AC | PRN
  Administered 2015-08-19 (×2): 50 ug via INTRAVENOUS

## 2015-08-19 MED ORDER — BOOST / RESOURCE BREEZE PO LIQD
1.0000 | Freq: Three times a day (TID) | ORAL | Status: DC
  Administered 2015-08-19 – 2015-08-20 (×4): 1 via ORAL

## 2015-08-19 MED ORDER — CEFAZOLIN SODIUM-DEXTROSE 2-3 GM-% IV SOLR
2.0000 g | INTRAVENOUS | Status: AC
  Administered 2015-08-19: 2 g via INTRAVENOUS
  Filled 2015-08-19: qty 50

## 2015-08-19 MED ORDER — HEPARIN SODIUM (PORCINE) 1000 UNIT/ML IJ SOLN
INTRAMUSCULAR | Status: AC
  Filled 2015-08-19: qty 1

## 2015-08-19 MED ORDER — HEPARIN SODIUM (PORCINE) 5000 UNIT/ML IJ SOLN
5000.0000 [IU] | Freq: Three times a day (TID) | INTRAMUSCULAR | Status: DC
  Filled 2015-08-19: qty 1

## 2015-08-19 MED ORDER — FENTANYL CITRATE (PF) 100 MCG/2ML IJ SOLN
INTRAMUSCULAR | Status: AC
  Filled 2015-08-19: qty 4

## 2015-08-19 MED ORDER — MIDAZOLAM HCL 2 MG/2ML IJ SOLN
INTRAMUSCULAR | Status: AC
  Filled 2015-08-19: qty 4

## 2015-08-19 NOTE — Progress Notes (Signed)
Patient ID: Judy Green, female   DOB: September 01, 1966, 49 y.o.   MRN: [id number] 1 Day Post-Op  Subjective: Pt feels well today.  Less pain.  Catheter was declotted, but still won't work.  Has not received her HD yet.  Objective: Vital signs in last 24 hours: Temp:  [97.7 F (36.5 C)-98.6 F (37 C)] 98.2 F (36.8 C) (09/03 0931) Pulse Rate:  [71-100] 79 (09/03 0931) Resp:  [13-26] 18 (09/03 0931) BP: (80-133)/(40-72) 109/59 mmHg (09/03 0931) SpO2:  [95 %-100 %] 96 % (09/03 0931) Weight:  [109.77 kg (242 lb)] 109.77 kg (242 lb) (09/03 0511) Last BM Date: 08/18/15  Intake/Output from previous day: 09/02 0701 - 09/03 0700 In: 200 [I.V.:200] Out: 1 [Urine:1] Intake/Output this shift: Total I/O In: 240 [P.O.:240] Out: 0   PE: Abd: soft, minimally tender, +BS, obese Heart: regular LungS: CTAB  Lab Results:   Recent Labs  08/16/15 1500 08/18/15 0518  WBC 9.0 7.3  HGB 10.3* 10.2*  HCT 31.1* 32.2*  PLT 210 252   BMET  Recent Labs  08/16/15 1500 08/18/15 0518  NA 132* 138  K 4.6 4.0  CL 95* 97*  CO2 20* 22  GLUCOSE 83 88  BUN 80* 40*  CREATININE 17.49* 15.20*  CALCIUM 7.7* 8.2*   PT/INR  Recent Labs  08/17/15 1120  LABPROT 17.7*  INR 1.45   CMP     Component Value Date/Time   NA 138 08/18/2015 0518   K 4.0 08/18/2015 0518   CL 97* 08/18/2015 0518   CO2 22 08/18/2015 0518   GLUCOSE 88 08/18/2015 0518   BUN 40* 08/18/2015 0518   CREATININE 15.20* 08/18/2015 0518   CALCIUM 8.2* 08/18/2015 0518   PROT 6.5 08/16/2015 0241   ALBUMIN 3.1* 08/16/2015 1500   AST 16 08/16/2015 0241   ALT 16 08/16/2015 0241   ALKPHOS 94 08/16/2015 0241   BILITOT 1.0 08/16/2015 0241   GFRNONAA 2* 08/18/2015 0518   GFRAA 3* 08/18/2015 0518   Lipase     Component Value Date/Time   LIPASE 23 08/15/2015 1609       Studies/Results: Ir Angio Av Shunt Addl Access  08/19/2015   CLINICAL DATA:  49 year old female with a long history of end-stage renal disease on  hemodialysis. She currently has dialysis via a right upper extremity HERO graft. The graft is currently clotted. Per the patient, she had issues with the graft clotting earlier this month and [facility] by Dr. Augustin Coupe.  EXAM: IR RIGHT DECLOT; IR ULTRASOUND GUIDANCE VASC ACCESS RIGHT; ARTERIOVENOUS SHUNT FOR DIALYSIS; ADDITIONAL ACCESS  Date: 08/19/2015  PROCEDURE: 1. Ultrasound-guided antegrade puncture of arteriovenous graft 2. Ultrasound-guided retrograde puncture of arteriovenous graft 3. Pharmacomechanical thrombolysis/thrombectomy procedure 4. Shuntogram Interventional Radiologist:  Criselda Peaches, MD  ANESTHESIA/SEDATION: Moderate (conscious) sedation was used. 1.5 mg Versed, 75 mcg Fentanyl were administered intravenously. The patient's vital signs were monitored continuously by radiology nursing throughout the procedure.  Sedation Time: 35 minutes  MEDICATIONS: None additional  FLUOROSCOPY TIME:  7 minutes 54 seconds  185.8 mGy  CONTRAST:  25mL OMNIPAQUE IOHEXOL 300 MG/ML  SOLN  TECHNIQUE: Informed consent was obtained from the patient following explanation of the procedure, risks, benefits and alternatives. The patient understands, agrees and consents for the procedure. All questions were addressed. A time out was performed.  Maximal barrier sterile technique utilized including caps, mask, sterile gowns, sterile gloves, large sterile drape, hand hygiene, and Betadine skin prep.  The right upper  extremity arteriovenous graft was interrogated with ultrasound. The graft is completely thrombosed. A suitable antegrade puncture site was identified. Local anesthesia was attained by infiltration with 1% lidocaine. Under real-time ultrasound guidance, the graft was punctured with a 21 gauge micropuncture needle. An image was obtained and stored for the medical record. A transitional 5 French micro sheath was advanced over a micro wire.  A suitable retrograde puncture site was next  identified. Again, local anesthesia was attained by infiltration with 1% lidocaine. Under real-time sonographic guidance, the graft was punctured with a 21 gauge micropuncture needle. Again, a transitional 5 [place] micro sheath was advanced over a micro wire. A total of 4 mg tPA was then injected into the thrombus through the 2 sheaths. This sheath were then exchanged over a short Amplatz wire for working 6 [place] vascular sheath. A 5 [place] Kum[place] the catheter was navigated through the retrograde sheath and into the arterial anastomosis. The artery is widely patent. This also identified the site of the arterial anastomosis. A tear full a device was then advanced from antegrade access into the distal portion of the catheter. Mechanical thrombectomy was then performed throughout the length of the catheter and the venous limb of the graft. The catheter was then inserted through the retrograde sheath and carefully placed through the arterial anastomosis. The thrombectomy device was then used in basket fashion to pull the arterial plug back into the arterial limb of the graft. This was then macerated with the device. Aspiration was performed through the two 6 French sheaths. Abundant clot was aspirated. Free bleed back through the sheath confirmed restoration of flow. The Kumpe the catheter was then advanced into the arterial anastomosis and a shuntogram was performed. The arterial anastomosis is widely patent. There is no residual thrombus. Flow is brisk.  The sheaths were removed and hemostasis attained with the assistance of 2-0 nylon pursestring sutures. The patient tolerated the procedure well.  COMPLICATIONS: None  Estimated blood loss: Approximately 100 mL  IMPRESSION: 1. Completely thrombosed hero graft. 2. Successful pharmaco mechanical thrombolysis/thrombectomy procedure. There is no evidence of inciting stenosis or other abnormality to explain thrombosis of this access. 3. Flow is restored and brisk.  May  resume hemodialysis as needed.  Signed,  Criselda Peaches, MD  [facility]  [facility]   Electronically Signed   By: Jacqulynn Cadet M.D.   On: 08/19/2015 09:13   Ir US Guide Vasc Access Right  08/19/2015   CLINICAL DATA:  49 year old female with a long history of end-stage renal disease on hemodialysis. She currently has dialysis via a right upper extremity HERO graft. The graft is currently clotted. Per the patient, she had issues with the graft clotting earlier this month and was [facility] by Dr. Augustin Coupe.  EXAM: IR RIGHT DECLOT; IR ULTRASOUND GUIDANCE VASC ACCESS RIGHT; ARTERIOVENOUS SHUNT FOR DIALYSIS; ADDITIONAL ACCESS  Date: 08/19/2015  PROCEDURE: 1. Ultrasound-guided antegrade puncture of arteriovenous graft 2. Ultrasound-guided retrograde puncture of arteriovenous graft 3. Pharmacomechanical thrombolysis/thrombectomy procedure 4. Shuntogram Interventional Radiologist:  Criselda Peaches, MD  ANESTHESIA/SEDATION: Moderate (conscious) sedation was used. 1.5 mg Versed, 75 mcg Fentanyl were administered intravenously. The patient's vital signs were monitored continuously by radiology nursing throughout the procedure.  Sedation Time: 35 minutes  MEDICATIONS: None additional  FLUOROSCOPY TIME:  7 minutes 54 seconds  185.8 mGy  CONTRAST:  69mL OMNIPAQUE IOHEXOL 300 MG/ML  SOLN  TECHNIQUE: Informed consent was obtained from the patient following explanation  of the procedure, risks, benefits and alternatives. The patient understands, agrees and consents for the procedure. All questions were addressed. A time out was performed.  Maximal barrier sterile technique utilized including caps, mask, sterile gowns, sterile gloves, large sterile drape, hand hygiene, and Betadine skin prep.  The right upper extremity arteriovenous graft was interrogated with ultrasound. The graft is completely thrombosed. A suitable antegrade puncture site was  identified. Local anesthesia was attained by infiltration with 1% lidocaine. Under real-time ultrasound guidance, the graft was punctured with a 21 gauge micropuncture needle. An image was obtained and stored for the medical record. A transitional 5 French micro sheath was advanced over a micro wire.  A suitable retrograde puncture site was next identified. Again, local anesthesia was attained by infiltration with 1% lidocaine. Under real-time sonographic guidance, the graft was punctured with a 21 gauge micropuncture needle. Again, a transitional 5 [place] micro sheath was advanced over a micro wire. A total of 4 mg tPA was then injected into the thrombus through the 2 sheaths. This sheath were then exchanged over a short Amplatz wire for working 6 [place] vascular sheath. A 5 [place] Kum[place] the catheter was navigated through the retrograde sheath and into the arterial anastomosis. The artery is widely patent. This also identified the site of the arterial anastomosis. A tear full a device was then advanced from antegrade access into the distal portion of the catheter. Mechanical thrombectomy was then performed throughout the length of the catheter and the venous limb of the graft. The catheter was then inserted through the retrograde sheath and carefully placed through the arterial anastomosis. The thrombectomy device was then used in basket fashion to pull the arterial plug back into the arterial limb of the graft. This was then macerated with the device. Aspiration was performed through the two 6 French sheaths. Abundant clot was aspirated. Free bleed back through the sheath confirmed restoration of flow. The Kumpe the catheter was then advanced into the arterial anastomosis and a shuntogram was performed. The arterial anastomosis is widely patent. There is no residual thrombus. Flow is brisk.  The sheaths were removed and hemostasis attained with the assistance of 2-0 nylon pursestring sutures. The patient tolerated  the procedure well.  COMPLICATIONS: None  Estimated blood loss: Approximately 100 mL  IMPRESSION: 1. Completely thrombosed hero graft. 2. Successful pharmaco mechanical thrombolysis/thrombectomy procedure. There is no evidence of inciting stenosis or other abnormality to explain thrombosis of this access. 3. Flow is restored and brisk.  May resume hemodialysis as needed.  Signed,  Criselda Peaches, MD  [facility]  [facility]   Electronically Signed   By: Jacqulynn Cadet M.D.   On: 08/19/2015 09:13   Ir Declot Right Mod Sed  08/19/2015   CLINICAL DATA:  49 year old female with a long history of end-stage renal disease on hemodialysis. She currently has dialysis via a right upper extremity HERO graft. The graft is currently clotted. Per the patient, she had issues with the graft clotting earlier this month and was [facility] by Dr. Augustin Coupe.  EXAM: IR RIGHT DECLOT; IR ULTRASOUND GUIDANCE VASC ACCESS RIGHT; ARTERIOVENOUS SHUNT FOR DIALYSIS; ADDITIONAL ACCESS  Date: 08/19/2015  PROCEDURE: 1. Ultrasound-guided antegrade puncture of arteriovenous graft 2. Ultrasound-guided retrograde puncture of arteriovenous graft 3. Pharmacomechanical thrombolysis/thrombectomy procedure 4. Shuntogram Interventional Radiologist:  Criselda Peaches, MD  ANESTHESIA/SEDATION: Moderate (conscious) sedation was used. 1.5 mg Versed, 75 mcg Fentanyl were administered intravenously. The patient's vital signs  were monitored continuously by radiology nursing throughout the procedure.  Sedation Time: 35 minutes  MEDICATIONS: None additional  FLUOROSCOPY TIME:  7 minutes 54 seconds  185.8 mGy  CONTRAST:  104mL OMNIPAQUE IOHEXOL 300 MG/ML  SOLN  TECHNIQUE: Informed consent was obtained from the patient following explanation of the procedure, risks, benefits and alternatives. The patient understands, agrees and consents for the procedure. All questions were addressed. A  time out was performed.  Maximal barrier sterile technique utilized including caps, mask, sterile gowns, sterile gloves, large sterile drape, hand hygiene, and Betadine skin prep.  The right upper extremity arteriovenous graft was interrogated with ultrasound. The graft is completely thrombosed. A suitable antegrade puncture site was identified. Local anesthesia was attained by infiltration with 1% lidocaine. Under real-time ultrasound guidance, the graft was punctured with a 21 gauge micropuncture needle. An image was obtained and stored for the medical record. A transitional 5 French micro sheath was advanced over a micro wire.  A suitable retrograde puncture site was next identified. Again, local anesthesia was attained by infiltration with 1% lidocaine. Under real-time sonographic guidance, the graft was punctured with a 21 gauge micropuncture needle. Again, a transitional 5 [place] micro sheath was advanced over a micro wire. A total of 4 mg tPA was then injected into the thrombus through the 2 sheaths. This sheath were then exchanged over a short Amplatz wire for working 6 [place] vascular sheath. A 5 [place] Kum[place] the catheter was navigated through the retrograde sheath and into the arterial anastomosis. The artery is widely patent. This also identified the site of the arterial anastomosis. A tear full a device was then advanced from antegrade access into the distal portion of the catheter. Mechanical thrombectomy was then performed throughout the length of the catheter and the venous limb of the graft. The catheter was then inserted through the retrograde sheath and carefully placed through the arterial anastomosis. The thrombectomy device was then used in basket fashion to pull the arterial plug back into the arterial limb of the graft. This was then macerated with the device. Aspiration was performed through the two 6 French sheaths. Abundant clot was aspirated. Free bleed back through the sheath confirmed  restoration of flow. The Kumpe the catheter was then advanced into the arterial anastomosis and a shuntogram was performed. The arterial anastomosis is widely patent. There is no residual thrombus. Flow is brisk.  The sheaths were removed and hemostasis attained with the assistance of 2-0 nylon pursestring sutures. The patient tolerated the procedure well.  COMPLICATIONS: None  Estimated blood loss: Approximately 100 mL  IMPRESSION: 1. Completely thrombosed hero graft. 2. Successful pharmaco mechanical thrombolysis/thrombectomy procedure. There is no evidence of inciting stenosis or other abnormality to explain thrombosis of this access. 3. Flow is restored and brisk.  May resume hemodialysis as needed.  Signed,  Criselda Peaches, MD  [facility]  [facility] Radiology   Electronically Signed   By: Jacqulynn Cadet M.D.   On: 08/19/2015 09:13    Anti-infectives: Anti-infectives    Start     Dose/Rate Route Frequency Ordered Stop   08/17/15 2000  ciprofloxacin (CIPRO) IVPB 400 mg  Status:  Discontinued     400 mg 200 mL/hr over 60 Minutes Intravenous Every 24 hours 08/17/15 1007 08/17/15 1437   08/17/15 1200  vancomycin (VANCOCIN) IVPB 1000 mg/200 mL premix  Status:  Discontinued     1,000 mg 200 mL/hr over 60 Minutes Intravenous Every T-Th-Sa (Hemodialysis) 08/16/15 0646 08/16/15  1220   08/17/15 1200  metroNIDAZOLE (FLAGYL) IVPB 500 mg  Status:  Discontinued     500 mg 100 mL/hr over 60 Minutes Intravenous Every 8 hours 08/17/15 1007 08/17/15 1437   08/16/15 2200  ciprofloxacin (CIPRO) IVPB 400 mg  Status:  Discontinued     400 mg 200 mL/hr over 60 Minutes Intravenous Every 24 hours 08/15/15 2329 08/16/15 0825   08/16/15 1800  ciprofloxacin (CIPRO) IVPB 400 mg  Status:  Discontinued     400 mg 200 mL/hr over 60 Minutes Intravenous Every 24 hours 08/16/15 1300 08/16/15 1605   08/16/15 1300  metroNIDAZOLE (FLAGYL) IVPB 500 mg  Status:  Discontinued     500  mg 100 mL/hr over 60 Minutes Intravenous Every 8 hours 08/16/15 1221 08/16/15 1605   08/16/15 0800  piperacillin-tazobactam (ZOSYN) IVPB 2.25 g  Status:  Discontinued     2.25 g 100 mL/hr over 30 Minutes Intravenous Every 8 hours 08/16/15 0645 08/16/15 1220   08/16/15 0700  vancomycin (VANCOCIN) 2,500 mg in sodium chloride 0.9 % 500 mL IVPB     2,500 mg 250 mL/hr over 120 Minutes Intravenous  Once 08/16/15 0645 08/16/15 0941   08/16/15 0600  metroNIDAZOLE (FLAGYL) IVPB 500 mg  Status:  Discontinued     500 mg 100 mL/hr over 60 Minutes Intravenous Every 8 hours 08/15/15 2310 08/16/15 0636   08/15/15 2130  ciprofloxacin (CIPRO) IVPB 400 mg     400 mg 200 mL/hr over 60 Minutes Intravenous  Once 08/15/15 2123 08/15/15 2317   08/15/15 2130  metroNIDAZOLE (FLAGYL) IVPB 500 mg     500 mg 100 mL/hr over 60 Minutes Intravenous  Once 08/15/15 2123 08/15/15 2317       Assessment/Plan  1. Colon mass -path reveals ischemic injury changes, no evidence of malignancy or dysplasia -plan for colectomy on Tuesday -cont clear liquids in the meantime.  Will add resource breeze TID for nutritional support -patient will need to get femoral access and HD prior to surgical intervention.  LOS: 4 days    Simonne Boulos E 08/19/2015, 9:48 AM Pager: [phone]

## 2015-08-19 NOTE — Procedures (Signed)
Successful placement of tunneled HD catheter with tips terminating within the inferior aspect of the right atrium.   The patient tolerated the procedure well without immediate post procedural complication.  The catheter is ready for immediate use.   Ronny Bacon, MD Pager #: [phone]

## 2015-08-19 NOTE — Progress Notes (Signed)
Patient ID: Judy Green, female   DOB: 29-May-1966, 49 y.o.   MRN: [id number]    Referring Physician(s): Webb,M  Chief Complaint: Clotted rt arm dialysis access  Subjective: Pt with ESRD and s/p thrombolysis of RUE HERO graft on 9/2; today graft has reclotted and she is now scheduled for tunneled HD cath placement. She is also scheduled for colectomy on 9/6. She is currently without sig c/o.   Allergies: Review of patient's allergies indicates no known allergies.  Medications: Prior to Admission medications   Medication Sig Start Date End Date Taking? Authorizing Provider  aspirin EC 325 MG tablet Take 325 mg by mouth daily.   Yes Historical Provider, MD  Calcium Acetate 667 MG TABS Take 1,334-2,001 mg by mouth 3 (three) times daily with meals. Take 2001 mg by mouth 3 times daily with meals. Take 1334 mg by mouth twice daily with snacks or deserts.   Yes Historical Provider, MD  clopidogrel (PLAVIX) 75 MG tablet Take 75 mg by mouth daily.     Yes Historical Provider, MD  fludrocortisone (FLORINEF) 0.1 MG tablet Take 2 tablets (0.2 mg total) by mouth daily. 12/12/14  Yes Renato Shin, MD  gabapentin (NEURONTIN) 100 MG capsule Take 100 mg by mouth at bedtime.  11/18/13  Yes Historical Provider, MD  midodrine (PROAMATINE) 10 MG tablet Take 1 tablet (10 mg total) by mouth 3 (three) times daily. 09/06/12  Yes Theodis Blaze, MD  norethindrone (MICRONOR,CAMILA,ERRIN) 0.35 MG tablet Take 1 tablet (0.35 mg total) by mouth daily. 11/23/14  Yes Lahoma Crocker, MD  SENSIPAR 30 MG tablet Take 30 mg by mouth daily.  07/08/12  Yes Historical Provider, MD  cyclobenzaprine (FLEXERIL) 5 MG tablet Take 1 tablet (5 mg total) by mouth 3 (three) times daily as needed for muscle spasms. Patient not taking: Reported on 11/23/2014 09/23/14   Delfina Redwood, MD     Vital Signs: BP 109/59 mmHg  Pulse 79  Temp(Src) 98.2 F (36.8 C) (Axillary)  Resp 18  Ht 5\' 7"  (1.702 m)  Wt 242 lb (109.77 kg)   BMI 37.89 kg/m2  SpO2 96%  Physical Exam pt awake/alert; chest- CTA bilat; heart- RRR; abd- soft,+BS,NT; ext- rt upper arm HERO graft with no thrill/bruit; no sig LE edema  Imaging: Ct Abdomen Pelvis Wo Contrast  08/15/2015   CLINICAL DATA:  Generalized abdominal pain, chronic but worsening over the past several days  EXAM: CT ABDOMEN AND PELVIS WITHOUT CONTRAST  TECHNIQUE: Multidetector CT imaging of the abdomen and pelvis was performed following the standard protocol without IV contrast.  COMPARISON:  09/30/2014  FINDINGS: Lower chest: Left greater than right patchy dependent bibasilar consolidation is noted. Trace pleural effusions. Early pneumonia could appear similar to atelectasis.  Hepatobiliary: Unenhanced liver and gallbladder appear normal.  Pancreas: Normal  Spleen: Normal  Adrenals/Urinary Tract: Bilateral renal atrophy with low-density renal cortical cysts reidentified. No hydroureteronephrosis. No radiopaque renal or ureteral calculus. The bladder is decompressed.  Stomach/Bowel: There is short segmental terminal ileal wall thickening with mild surrounding stranding and abnormal hypodense thickening of this cecum wall. No pericolonic fluid collection to suggest abscess.  Vascular/Lymphatic: Vascular collaterals are present within the anterior abdominal wall. No aortic aneurysm. No lymphadenopathy. Lower extremity vascular bypass is partly visualized.  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 Uterus and ovaries appear unremarkable.  Musculoskeletal: Bilateral L5 pars interarticularis defects. No acute osseous abnormality.  IMPRESSION: Interval increase in terminal ileal wall thickening with contiguous abnormal cecal mural low  density most likely indicating inflammatory bowel disease such as Crohn's disease. Colitis/enteritis could appear similar. The appearance is much less typical for ischemia. Lymphoma could appear similar given the presumed previous history of  immunosuppression but is less likely.  Probable dependent bibasilar atelectasis. Early pneumonia could appear similar.   Electronically Signed   By: Conchita Paris M.D.   On: 08/15/2015 21:18   Ir Angio Av Shunt Addl Access  08/19/2015   CLINICAL DATA:  49 year old female with a long history of end-stage renal disease on hemodialysis. She currently has dialysis via a right upper extremity HERO graft. The graft is currently clotted. Per the patient, she had issues with the graft clotting earlier this month and [facility] by Dr. Augustin Coupe.  EXAM: IR RIGHT DECLOT; IR ULTRASOUND GUIDANCE VASC ACCESS RIGHT; ARTERIOVENOUS SHUNT FOR DIALYSIS; ADDITIONAL ACCESS  Date: 08/19/2015  PROCEDURE: 1. Ultrasound-guided antegrade puncture of arteriovenous graft 2. Ultrasound-guided retrograde puncture of arteriovenous graft 3. Pharmacomechanical thrombolysis/thrombectomy procedure 4. Shuntogram Interventional Radiologist:  Criselda Peaches, MD  ANESTHESIA/SEDATION: Moderate (conscious) sedation was used. 1.5 mg Versed, 75 mcg Fentanyl were administered intravenously. The patient's vital signs were monitored continuously by radiology nursing throughout the procedure.  Sedation Time: 35 minutes  MEDICATIONS: None additional  FLUOROSCOPY TIME:  7 minutes 54 seconds  185.8 mGy  CONTRAST:  79mL OMNIPAQUE IOHEXOL 300 MG/ML  SOLN  TECHNIQUE: Informed consent was obtained from the patient following explanation of the procedure, risks, benefits and alternatives. The patient understands, agrees and consents for the procedure. All questions were addressed. A time out was performed.  Maximal barrier sterile technique utilized including caps, mask, sterile gowns, sterile gloves, large sterile drape, hand hygiene, and Betadine skin prep.  The right upper extremity arteriovenous graft was interrogated with ultrasound. The graft is completely thrombosed. A suitable antegrade puncture site was identified. Local  anesthesia was attained by infiltration with 1% lidocaine. Under real-time ultrasound guidance, the graft was punctured with a 21 gauge micropuncture needle. An image was obtained and stored for the medical record. A transitional 5 French micro sheath was advanced over a micro wire.  A suitable retrograde puncture site was next identified. Again, local anesthesia was attained by infiltration with 1% lidocaine. Under real-time sonographic guidance, the graft was punctured with a 21 gauge micropuncture needle. Again, a transitional 5 [place] micro sheath was advanced over a micro wire. A total of 4 mg tPA was then injected into the thrombus through the 2 sheaths. This sheath were then exchanged over a short Amplatz wire for working 6 [place] vascular sheath. A 5 [place] Kumpe the catheter was navigated through the retrograde sheath and into the arterial anastomosis. The artery is widely patent. This also identified the site of the arterial anastomosis. A tear full a device was then advanced from antegrade access into the distal portion of the catheter. Mechanical thrombectomy was then performed throughout the length of the catheter and the venous limb of the graft. The catheter was then inserted through the retrograde sheath and carefully placed through the arterial anastomosis. The thrombectomy device was then used in basket fashion to pull the arterial plug back into the arterial limb of the graft. This was then macerated with the device. Aspiration was performed through the two 6 French sheaths. Abundant clot was aspirated. Free bleed back through the sheath confirmed restoration of flow. The Kumpe the catheter was then advanced into the arterial anastomosis and a shuntogram was performed. The arterial anastomosis is  widely patent. There is no residual thrombus. Flow is brisk.  The sheaths were removed and hemostasis attained with the assistance of 2-0 nylon pursestring sutures. The patient tolerated the procedure  well.  COMPLICATIONS: None  Estimated blood loss: Approximately 100 mL  IMPRESSION: 1. Completely thrombosed hero graft. 2. Successful pharmaco mechanical thrombolysis/thrombectomy procedure. There is no evidence of inciting stenosis or other abnormality to explain thrombosis of this access. 3. Flow is restored and brisk.  May resume hemodialysis as needed.  Signed,  Criselda Peaches, MD  [facility]  [facility]   Electronically Signed   By: Jacqulynn Cadet M.D.   On: 08/19/2015 09:13   Ir US Guide Vasc Access Right  08/19/2015   CLINICAL DATA:  49 year old female with a long history of end-stage renal disease on hemodialysis. She currently has dialysis via a right upper extremity HERO graft. The graft is currently clotted. Per the patient, she had issues with the graft clotting earlier this month and was [facility] by Dr. Augustin Coupe.  EXAM: IR RIGHT DECLOT; IR ULTRASOUND GUIDANCE VASC ACCESS RIGHT; ARTERIOVENOUS SHUNT FOR DIALYSIS; ADDITIONAL ACCESS  Date: 08/19/2015  PROCEDURE: 1. Ultrasound-guided antegrade puncture of arteriovenous graft 2. Ultrasound-guided retrograde puncture of arteriovenous graft 3. Pharmacomechanical thrombolysis/thrombectomy procedure 4. Shuntogram Interventional Radiologist:  Criselda Peaches, MD  ANESTHESIA/SEDATION: Moderate (conscious) sedation was used. 1.5 mg Versed, 75 mcg Fentanyl were administered intravenously. The patient's vital signs were monitored continuously by radiology nursing throughout the procedure.  Sedation Time: 35 minutes  MEDICATIONS: None additional  FLUOROSCOPY TIME:  7 minutes 54 seconds  185.8 mGy  CONTRAST:  58mL OMNIPAQUE IOHEXOL 300 MG/ML  SOLN  TECHNIQUE: Informed consent was obtained from the patient following explanation of the procedure, risks, benefits and alternatives. The patient understands, agrees and consents for the procedure. All questions were addressed. A time  out was performed.  Maximal barrier sterile technique utilized including caps, mask, sterile gowns, sterile gloves, large sterile drape, hand hygiene, and Betadine skin prep.  The right upper extremity arteriovenous graft was interrogated with ultrasound. The graft is completely thrombosed. A suitable antegrade puncture site was identified. Local anesthesia was attained by infiltration with 1% lidocaine. Under real-time ultrasound guidance, the graft was punctured with a 21 gauge micropuncture needle. An image was obtained and stored for the medical record. A transitional 5 French micro sheath was advanced over a micro wire.  A suitable retrograde puncture site was next identified. Again, local anesthesia was attained by infiltration with 1% lidocaine. Under real-time sonographic guidance, the graft was punctured with a 21 gauge micropuncture needle. Again, a transitional 5 [place] micro sheath was advanced over a micro wire. A total of 4 mg tPA was then injected into the thrombus through the 2 sheaths. This sheath were then exchanged over a short Amplatz wire for working 6 [place] vascular sheath. A 5 [place] Kumpe the catheter was navigated through the retrograde sheath and into the arterial anastomosis. The artery is widely patent. This also identified the site of the arterial anastomosis. A tear full a device was then advanced from antegrade access into the distal portion of the catheter. Mechanical thrombectomy was then performed throughout the length of the catheter and the venous limb of the graft. The catheter was then inserted through the retrograde sheath and carefully placed through the arterial anastomosis. The thrombectomy device was then used in basket fashion to pull the arterial plug back into the arterial limb of the  graft. This was then macerated with the device. Aspiration was performed through the two 6 French sheaths. Abundant clot was aspirated. Free bleed back through the sheath confirmed  restoration of flow. The Kumpe the catheter was then advanced into the arterial anastomosis and a shuntogram was performed. The arterial anastomosis is widely patent. There is no residual thrombus. Flow is brisk.  The sheaths were removed and hemostasis attained with the assistance of 2-0 nylon pursestring sutures. The patient tolerated the procedure well.  COMPLICATIONS: None  Estimated blood loss: Approximately 100 mL  IMPRESSION: 1. Completely thrombosed hero graft. 2. Successful pharmaco mechanical thrombolysis/thrombectomy procedure. There is no evidence of inciting stenosis or other abnormality to explain thrombosis of this access. 3. Flow is restored and brisk.  May resume hemodialysis as needed.  Signed,  Criselda Peaches, MD  [facility]  [facility]   Electronically Signed   By: Jacqulynn Cadet M.D.   On: 08/19/2015 09:13   Ir Declot Right Mod Sed  08/19/2015   CLINICAL DATA:  49 year old female with a long history of end-stage renal disease on hemodialysis. She currently has dialysis via a right upper extremity HERO graft. The graft is currently clotted. Per the patient, she had issues with the graft clotting earlier this month and was [facility] by Dr. Augustin Coupe.  EXAM: IR RIGHT DECLOT; IR ULTRASOUND GUIDANCE VASC ACCESS RIGHT; ARTERIOVENOUS SHUNT FOR DIALYSIS; ADDITIONAL ACCESS  Date: 08/19/2015  PROCEDURE: 1. Ultrasound-guided antegrade puncture of arteriovenous graft 2. Ultrasound-guided retrograde puncture of arteriovenous graft 3. Pharmacomechanical thrombolysis/thrombectomy procedure 4. Shuntogram Interventional Radiologist:  Criselda Peaches, MD  ANESTHESIA/SEDATION: Moderate (conscious) sedation was used. 1.5 mg Versed, 75 mcg Fentanyl were administered intravenously. The patient's vital signs were monitored continuously by radiology nursing throughout the procedure.  Sedation Time: 35 minutes  MEDICATIONS: None  additional  FLUOROSCOPY TIME:  7 minutes 54 seconds  185.8 mGy  CONTRAST:  75mL OMNIPAQUE IOHEXOL 300 MG/ML  SOLN  TECHNIQUE: Informed consent was obtained from the patient following explanation of the procedure, risks, benefits and alternatives. The patient understands, agrees and consents for the procedure. All questions were addressed. A time out was performed.  Maximal barrier sterile technique utilized including caps, mask, sterile gowns, sterile gloves, large sterile drape, hand hygiene, and Betadine skin prep.  The right upper extremity arteriovenous graft was interrogated with ultrasound. The graft is completely thrombosed. A suitable antegrade puncture site was identified. Local anesthesia was attained by infiltration with 1% lidocaine. Under real-time ultrasound guidance, the graft was punctured with a 21 gauge micropuncture needle. An image was obtained and stored for the medical record. A transitional 5 French micro sheath was advanced over a micro wire.  A suitable retrograde puncture site was next identified. Again, local anesthesia was attained by infiltration with 1% lidocaine. Under real-time sonographic guidance, the graft was punctured with a 21 gauge micropuncture needle. Again, a transitional 5 [place] micro sheath was advanced over a micro wire. A total of 4 mg tPA was then injected into the thrombus through the 2 sheaths. This sheath were then exchanged over a short Amplatz wire for working 6 [place] vascular sheath. A 5 [place] Kumpe the catheter was navigated through the retrograde sheath and into the arterial anastomosis. The artery is widely patent. This also identified the site of the arterial anastomosis. A tear full a device was then advanced from antegrade access into the distal portion of the catheter. Mechanical thrombectomy was then performed  throughout the length of the catheter and the venous limb of the graft. The catheter was then inserted through the retrograde sheath and  carefully placed through the arterial anastomosis. The thrombectomy device was then used in basket fashion to pull the arterial plug back into the arterial limb of the graft. This was then macerated with the device. Aspiration was performed through the two 6 French sheaths. Abundant clot was aspirated. Free bleed back through the sheath confirmed restoration of flow. The Kumpe the catheter was then advanced into the arterial anastomosis and a shuntogram was performed. The arterial anastomosis is widely patent. There is no residual thrombus. Flow is brisk.  The sheaths were removed and hemostasis attained with the assistance of 2-0 nylon pursestring sutures. The patient tolerated the procedure well.  COMPLICATIONS: None  Estimated blood loss: Approximately 100 mL  IMPRESSION: 1. Completely thrombosed hero graft. 2. Successful pharmaco mechanical thrombolysis/thrombectomy procedure. There is no evidence of inciting stenosis or other abnormality to explain thrombosis of this access. 3. Flow is restored and brisk.  May resume hemodialysis as needed.  Signed,  Criselda Peaches, MD  [facility]  [facility]   Electronically Signed   By: Jacqulynn Cadet M.D.   On: 08/19/2015 09:13    Labs:  CBC:  Recent Labs  08/15/15 1609 08/16/15 0241 08/16/15 1500 08/18/15 0518  WBC 10.3 10.2 9.0 7.3  HGB 12.3 10.7* 10.3* 10.2*  HCT 37.7 32.9* 31.1* 32.2*  PLT 185 172 210 252    COAGS:  Recent Labs  09/19/14 1554 08/17/15 1120  INR 1.16 1.45  APTT 31  --     BMP:  Recent Labs  08/15/15 1609 08/16/15 0241 08/16/15 1500 08/18/15 0518  NA 134* 130* 132* 138  K 4.1 4.2 4.6 4.0  CL 93* 91* 95* 97*  CO2 22 20* 20* 22  GLUCOSE 86 88 83 88  BUN 68* 73* 80* 40*  CALCIUM 8.1* 7.7* 7.7* 8.2*  CREATININE 16.24* 16.75* 17.49* 15.20*  GFRNONAA 2* 2* 2* 2*  GFRAA 3* 2* 2* 3*    LIVER FUNCTION TESTS:  Recent Labs  09/22/14 2055 09/23/14 0017  08/15/15 1609 08/16/15 0241 08/16/15 1500  BILITOT 0.5 0.3 1.2 1.0  --   AST 34 29 24 16   --   ALT 9 7 17 16   --   ALKPHOS 60 56 106 94  --   PROT 9.8* 8.8* 7.2 6.5  --   ALBUMIN 4.4 3.9 3.8 3.3* 3.1*    Assessment and Plan: Pt with ESRD and recurrent thrombosed RUE HERO graft following thrombolysis on 9/2; now scheduled today for tunneled femoral HD cath placement; colectomy scheduled for 9/6. Details/risks of procedure, including but not limited to, internal bleeding, infection, thrombosis, need for additional vascular access procedures d/w pt with her understanding and consent.    Signed: D. Rowe Robert 08/19/2015, 10:39 AM   I spent a total of 15 minutes at the the patient's bedside AND on the patient's hospital floor or unit, greater than 50% of which was counseling/coordinating care for HD catheter placement

## 2015-08-19 NOTE — Progress Notes (Signed)
[facility] Progress Note  Assessment/Plan: 1. Abdominal pain -colonoscopy showed ulcerated ileocecal valve tissue-less likely infections, no evidence of dysplasia or malignancy not c/w IBD per Dr. Hilarie Fredrickson via path - surgery planned for next week 2. ESRD - TTS - missed HD Tuesday - had Wed for 3 hour; missed Thursday HD due to clotted access - had declot Friday and then found to be clotted again late last PM when brought in for HD; needs catheter placed - have contacted IR to see if we can get one today and then plan HD to follow K 4 9/2 3. BP/volume - midodrine - for BP support , Net UF Wed 2.9 with post weight 109.7 - still above EDW; Keep BP >100 due to #1;  4. Anemia - Hgb 10.2 - continue Fe and ESA- moved Aranesp to Friday 5. Metabolic bone disease - Ca 8.2 -continue calcitriol and binders 6. Nutrition - Alb 3.1 - still on CL until surgery - pt agrees to try to drink 3 Resource per day to help with fluid 7. Adrenal insufficiency -on fludrocortisone - generally not as effective as solucortef, though she has been on this for quite some time; consider changing to different steroid 8. Clotted right upper HERO - IR unable to declot today last declot 8/22 post declot AF not improved Had PTA of venous intragraft 50% stenosis to < 10% by Dr. Augustin Coupe- declot yesterday, but found to be clotted again last night, but when cannulated RN just got dark blood and did not have HD- d/w Drs. Wyatts/Webb - plan HD cath - likely  need fem cath  Myriam Jacobson, PA-C [place] [phone] 08/19/2015,8:40 AM  LOS: 4 days   Subjective:   No c/o except graft is clotted  Objective Filed Vitals:   08/18/15 1800 08/18/15 1818 08/18/15 2005 08/19/15 0511  BP: 131/72 133/61 105/62 118/56  Pulse: 83 90 100 74  Temp:   98.6 F (37 C) 97.7 F (36.5 C)  TempSrc:      Resp: 17 18 18 18   Height:      Weight:    109.77 kg (242 lb)  SpO2: 95%  98% 97%   Physical Exam General:  NAD Heart: RRR Lungs: no rales Abdomen: obese Extremities: no sig edema Dialysis Access:  Right upper HERO no bruit  Dialysis Orders: [facility] 4 hr 450/800 right HERO 2 K 2 Ca profile heparin 3000 with 3000 min tmt, Mircera 75 last 8/16 venofer 100 x 5 start 9/1 calcitriol 0.75 Recent labs: Hgb 11.2 8/25 15% sat ferritin 1133 July 23% sat July iPTH 456 coming down with normal Ca/P  Additional Objective Labs: Basic Metabolic Panel:  Recent Labs Lab 08/16/15 0241 08/16/15 1500 08/18/15 0518  NA 130* 132* 138  K 4.2 4.6 4.0  CL 91* 95* 97*  CO2 20* 20* 22  GLUCOSE 88 83 88  BUN 73* 80* 40*  CREATININE 16.75* 17.49* 15.20*  CALCIUM 7.7* 7.7* 8.2*  PHOS  --  7.6*  --    Liver Function Tests:  Recent Labs Lab 08/15/15 1609 08/16/15 0241 08/16/15 1500  AST 24 16  --   ALT 17 16  --   ALKPHOS 106 94  --   BILITOT 1.2 1.0  --   PROT 7.2 6.5  --   ALBUMIN 3.8 3.3* 3.1*    Recent Labs Lab 08/15/15 1609  LIPASE 23   CBC:  Recent Labs Lab 08/15/15 1609 08/16/15 0241 08/16/15 1500 08/18/15 0518  WBC 10.3 10.2 9.0 7.3  NEUTROABS 8.4* 7.9*  --   --   HGB 12.3 10.7* 10.3* 10.2*  HCT 37.7 32.9* 31.1* 32.2*  MCV 98.2 96.8 95.1 97.9  PLT 185 172 210 252   CBG:  Recent Labs Lab 08/18/15 0722 08/18/15 1127 08/18/15 1631 08/18/15 2003 08/19/15 0740  GLUCAP 99 88 83 98 82    . sodium chloride 1,000 mL (08/18/15 1001)   . aspirin EC  325 mg Oral Daily  . calcitRIOL  0.75 mcg Oral Q T,Th,Sa-HD  . calcium acetate  2,001 mg Oral TID WC  . darbepoetin (ARANESP) injection - DIALYSIS  60 mcg Intravenous Q Fri-HD  . [START ON 09/17/2015] darbepoetin (ARANESP) injection - DIALYSIS  60 mcg Intravenous Q Thu-HD  . ferric gluconate (FERRLECIT/NULECIT) IV  125 mg Intravenous Q T,Th,Sa-HD  . fludrocortisone  0.2 mg Oral Daily  . gabapentin  100 mg Oral QHS  . midodrine  10 mg Oral TID WC  . sodium chloride  3 mL Intravenous Q12H

## 2015-08-19 NOTE — Progress Notes (Signed)
Daily Rounding Note  08/19/2015, 10:09 AM  LOS: 4 days   SUBJECTIVE:       Abdominal pain improved, no nausea on clears.  No bloody stools.  OBJECTIVE:         Vital signs in last 24 hours:    Temp:  [97.7 F (36.5 C)-98.6 F (37 C)] 98.2 F (36.8 C) (09/03 0931) Pulse Rate:  [71-100] 79 (09/03 0931) Resp:  [13-26] 18 (09/03 0931) BP: (80-133)/(40-72) 109/59 mmHg (09/03 0931) SpO2:  [95 %-100 %] 96 % (09/03 0931) Weight:  [242 lb (109.77 kg)] 242 lb (109.77 kg) (09/03 0511) Last BM Date: 08/18/15 Filed Weights   08/16/15 1802 08/17/15 1958 08/19/15 0511  Weight: 241 lb 13.5 oz (109.7 kg) 242 lb 9.6 oz (110.043 kg) 242 lb (109.77 kg)   General: comfortable, looks well.    Heart: RRR Chest: clear bil, no dyspnea Abdomen: soft, active BS some right sided tenderness, no guard or rebound   Extremities: no CCE Neuro/Psych:  Pleasant, oriented x 3.  Fully alert.  No gross weakness.   Intake/Output from previous day: 09/02 0701 - 09/03 0700 In: 200 [I.V.:200] Out: 1 [Urine:1]  Intake/Output this shift: Total I/O In: 240 [P.O.:240] Out: 0   Lab Results:  Recent Labs  08/16/15 1500 08/18/15 0518  WBC 9.0 7.3  HGB 10.3* 10.2*  HCT 31.1* 32.2*  PLT 210 252   BMET  Recent Labs  08/16/15 1500 08/18/15 0518  NA 132* 138  K 4.6 4.0  CL 95* 97*  CO2 20* 22  GLUCOSE 83 88  BUN 80* 40*  CREATININE 17.49* 15.20*  CALCIUM 7.7* 8.2*   LFT  Recent Labs  08/16/15 1500  ALBUMIN 3.1*   PT/INR  Recent Labs  08/17/15 1120  LABPROT 17.7*  INR 1.45   Hepatitis Panel No results for input(s): HEPBSAG, HCVAB, HEPAIGM, HEPBIGM in the last 72 hours.  Studies/Results: Ir Angio Av Shunt Addl Access Ir US Guide Vasc Access Right Ir Declot Right Mod Sed 08/19/2015   CLINICAL DATA:  49 year old female with a long history of end-stage renal disease on hemodialysis. She currently has dialysis via a right upper  extremity HERO graft. The graft is currently clotted. Per the patient, she had issues with the graft clotting earlier this month and [facility] by Dr. Augustin Coupe.  EXAM: IR RIGHT DECLOT; IR ULTRASOUND GUIDANCE VASC ACCESS RIGHT; ARTERIOVENOUS SHUNT FOR DIALYSIS; ADDITIONAL ACCESS  Date: 08/19/2015  PROCEDURE: 1. Ultrasound-guided antegrade puncture of arteriovenous graft 2. Ultrasound-guided retrograde puncture of arteriovenous graft 3. Pharmacomechanical thrombolysis/thrombectomy procedure 4. Shuntogram Interventional Radiologist:  Criselda Peaches, MD  ANESTHESIA/SEDATION: Moderate (conscious) sedation was used. 1.5 mg Versed, 75 mcg Fentanyl were administered intravenously. The patient's vital signs were monitored continuously by radiology nursing throughout the procedure.  Sedation Time: 35 minutes  MEDICATIONS: None additional  FLUOROSCOPY TIME:  7 minutes 54 seconds  185.8 mGy  CONTRAST:  12mL OMNIPAQUE IOHEXOL 300 MG/ML  SOLN  TECHNIQUE: Informed consent was obtained from the patient following explanation of the procedure, risks, benefits and alternatives. The patient understands, agrees and consents for the procedure. All questions were addressed. A time out was performed.  Maximal barrier sterile technique utilized including caps, mask, sterile gowns, sterile gloves, large sterile drape, hand hygiene, and Betadine skin prep.  The right upper extremity arteriovenous graft was interrogated with ultrasound. The graft is completely thrombosed. A suitable antegrade puncture site was  identified. Local anesthesia was attained by infiltration with 1% lidocaine. Under real-time ultrasound guidance, the graft was punctured with a 21 gauge micropuncture needle. An image was obtained and stored for the medical record. A transitional 5 French micro sheath was advanced over a micro wire.  A suitable retrograde puncture site was next identified. Again, local anesthesia was attained by  infiltration with 1% lidocaine. Under real-time sonographic guidance, the graft was punctured with a 21 gauge micropuncture needle. Again, a transitional 5 [place] micro sheath was advanced over a micro wire. A total of 4 mg tPA was then injected into the thrombus through the 2 sheaths. This sheath were then exchanged over a short Amplatz wire for working 6 [place] vascular sheath. A 5 [place] Kumpe the catheter was navigated through the retrograde sheath and into the arterial anastomosis. The artery is widely patent. This also identified the site of the arterial anastomosis. A tear full a device was then advanced from antegrade access into the distal portion of the catheter. Mechanical thrombectomy was then performed throughout the length of the catheter and the venous limb of the graft. The catheter was then inserted through the retrograde sheath and carefully placed through the arterial anastomosis. The thrombectomy device was then used in basket fashion to pull the arterial plug back into the arterial limb of the graft. This was then macerated with the device. Aspiration was performed through the two 6 French sheaths. Abundant clot was aspirated. Free bleed back through the sheath confirmed restoration of flow. The Kumpe the catheter was then advanced into the arterial anastomosis and a shuntogram was performed. The arterial anastomosis is widely patent. There is no residual thrombus. Flow is brisk.  The sheaths were removed and hemostasis attained with the assistance of 2-0 nylon pursestring sutures. The patient tolerated the procedure well.  COMPLICATIONS: None  Estimated blood loss: Approximately 100 mL  IMPRESSION: 1. Completely thrombosed hero graft. 2. Successful pharmaco mechanical thrombolysis/thrombectomy procedure. There is no evidence of inciting stenosis or other abnormality to explain thrombosis of this access. 3. Flow is restored and brisk.  May resume hemodialysis as needed.  Signed,  Criselda Peaches, MD  [facility]  [facility]   Electronically Signed   By: Jacqulynn Cadet M.D.   On: 08/19/2015 09:13   Scheduled Meds: . aspirin EC  325 mg Oral Daily  . calcitRIOL  0.75 mcg Oral Q T,Th,Sa-HD  . calcium acetate  2,001 mg Oral TID WC  . darbepoetin (ARANESP) injection - DIALYSIS  60 mcg Intravenous Q Fri-HD  . [START ON 09-09-15] darbepoetin (ARANESP) injection - DIALYSIS  60 mcg Intravenous Q Thu-HD  . feeding supplement  1 Container Oral TID BM  . ferric gluconate (FERRLECIT/NULECIT) IV  125 mg Intravenous Q T,Th,Sa-HD  . fludrocortisone  0.2 mg Oral Daily  . gabapentin  100 mg Oral QHS  . midodrine  10 mg Oral TID WC  . sodium chloride  3 mL Intravenous Q12H   Continuous Infusions: . sodium chloride 1,000 mL (08/18/15 1001)   PRN Meds:.sodium chloride, sodium chloride, acetaminophen **OR** acetaminophen, fentaNYL (SUBLIMAZE) injection, iohexol, ondansetron **OR** ondansetron (ZOFRAN) IV   ASSESMENT:   * Abdominal pain with dark stool. Terminal ileal/cecal inflammation per CT.  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  Ulcerations in ascending colon.  Multiple biopsies performed. Sigmoid tics, grade 1 int rrhoids.  Prelim pathology revealing ischemic injury.  General surgery planning colectomy on 9/6.    * ESRD. Transplanted  2000, failed 2007. On transplant list at [facility]. S/p 9/2 IR thrombolysis, thrombectomy of clotted graft.   * Anemia. Normocytic. Requires periodic parenteral iron given at HD. Says she has not been receiving epogen like infusions in several months.   * Hyponatremia  * Chronic Plavix for hx vascular thrombosis. Last dose was 8/29.   *  Adrenal insufficiency with chronic hypotension.  On Florinef.    PLAN   *  Supportive care with clear liquids.  Colectomy planned for 9/6. GI available prn, but will sign off.       Azucena Freed  08/19/2015, 10:09 AM Pager:  [phone]

## 2015-08-19 NOTE — Sedation Documentation (Signed)
O2 2L/Success started 

## 2015-08-19 NOTE — Sedation Documentation (Signed)
O2 d/c'd 

## 2015-08-19 NOTE — Progress Notes (Signed)
Patient Demographics  Judy Green, is a 49 y.o. female, DOB - 08/11/1966, IRC:[id number]  Admit date - 08/15/2015   Admitting Physician Rise Patience, MD  Outpatient Primary MD for the patient is Kandice Hams, MD  LOS - 4   Chief Complaint  Patient presents with  . Abdominal Pain         Subjective:   Judy Green today has, No headache, No chest pain, No new weakness tingling or numbness, No Cough - SOB, reports abd pain has resolved, had a thrombectomy of her HERO graft yesterday, BUT unfortunately did not receive hemodialysis as it was  nonfunctional  Assessment & Plan    Principal Problem:   Colitis Active Problems:   ESRD on hemodialysis   Chronic hypotension   Abnormal CT scan, gastrointestinal tract   Anemia in chronic kidney disease   Clotted renal dialysis AV graft   ESRD (end stage renal disease)  Abdominal pain and diarrhea - CT abdomen and pelvis showing colitis involving cecum and terminal ileal area. - GI consult appreciated, colonoscopy 08/18/15, finding significant for ulcerated cecal mass, biopsies Significant for ischemic finding, no evidence of malignancy , CEA within normal limits, surgery and GI involved , unclear at this point if it surgical intervention giving near obstructive nature of the mass , versus repeat colonoscopy , discussed with GI and surgery , will monitor her course clinically . - C. difficile negative  Questionable pneumonia on imaging - CT abdomen showing bibasilar infiltrate/atelectasis, no clinical evidence of pneumonia,  IV vancomycin and Zosyn  stopped .  Chronic hypotension - Continue with fludrocortisone and midodrine   End-stage renal disease - Nephrology consulted - On TTS schedule, scoliosis since admission given clotted hero graft, status post thrombolysis/thrombectomy by IR 9/2, unfortunately still nonfunctional,  plan for temporary femoral access today by IR.  History of failed renal transplant  Anemia -  related to renal disease  Code Status: Full  Family Communication: Discussed with patient, none at bedside  Disposition Plan: Home when stable   anoscopy 9/2/2016lts   Renal GI   Medications  Scheduled Meds: . aspirin EC  325 mg Oral Daily  . calcitRIOL  0.75 mcg Oral Q T,Th,Sa-HD  . calcium acetate  2,001 mg Oral TID WC  .  ceFAZolin (ANCEF) IV  2 g Intravenous to XRAY  . darbepoetin (ARANESP) injection - DIALYSIS  60 mcg Intravenous Q Fri-HD  . [START ON Aug 30, 2015] darbepoetin (ARANESP) injection - DIALYSIS  60 mcg Intravenous Q Thu-HD  . feeding supplement  1 Container Oral TID BM  . ferric gluconate (FERRLECIT/NULECIT) IV  125 mg Intravenous Q T,Th,Sa-HD  . fludrocortisone  0.2 mg Oral Daily  . gabapentin  100 mg Oral QHS  . midodrine  10 mg Oral TID WC  . sodium chloride  3 mL Intravenous Q12H   Continuous Infusions: . sodium chloride 1,000 mL (08/18/15 1001)   PRN Meds:.sodium chloride, sodium chloride, acetaminophen **OR** acetaminophen, fentaNYL (SUBLIMAZE) injection, iohexol, ondansetron **OR** ondansetron (ZOFRAN) IV  DVT Prophylaxis   Heparin -  Lab Results  Component Value Date   PLT 252 08/18/2015    Antibiotics    Anti-infectives    Start     Dose/Rate Route Frequency Ordered Stop  08/19/15 1200  ceFAZolin (ANCEF) IVPB 2 g/50 mL premix     2 g 100 mL/hr over 30 Minutes Intravenous To Radiology 08/19/15 1051 08/20/15 1200   08/17/15 2000  ciprofloxacin (CIPRO) IVPB 400 mg  Status:  Discontinued     400 mg 200 mL/hr over 60 Minutes Intravenous Every 24 hours 08/17/15 1007 08/17/15 1437   08/17/15 1200  vancomycin (VANCOCIN) IVPB 1000 mg/200 mL premix  Status:  Discontinued     1,000 mg 200 mL/hr over 60 Minutes Intravenous Every T-Th-Sa (Hemodialysis) 08/16/15 0646 08/16/15 1220   08/17/15 1200  metroNIDAZOLE (FLAGYL) IVPB 500 mg  Status:  Discontinued      500 mg 100 mL/hr over 60 Minutes Intravenous Every 8 hours 08/17/15 1007 08/17/15 1437   08/16/15 2200  ciprofloxacin (CIPRO) IVPB 400 mg  Status:  Discontinued     400 mg 200 mL/hr over 60 Minutes Intravenous Every 24 hours 08/15/15 2329 08/16/15 0825   08/16/15 1800  ciprofloxacin (CIPRO) IVPB 400 mg  Status:  Discontinued     400 mg 200 mL/hr over 60 Minutes Intravenous Every 24 hours 08/16/15 1300 08/16/15 1605   08/16/15 1300  metroNIDAZOLE (FLAGYL) IVPB 500 mg  Status:  Discontinued     500 mg 100 mL/hr over 60 Minutes Intravenous Every 8 hours 08/16/15 1221 08/16/15 1605   08/16/15 0800  piperacillin-tazobactam (ZOSYN) IVPB 2.25 g  Status:  Discontinued     2.25 g 100 mL/hr over 30 Minutes Intravenous Every 8 hours 08/16/15 0645 08/16/15 1220   08/16/15 0700  vancomycin (VANCOCIN) 2,500 mg in sodium chloride 0.9 % 500 mL IVPB     2,500 mg 250 mL/hr over 120 Minutes Intravenous  Once 08/16/15 0645 08/16/15 0941   08/16/15 0600  metroNIDAZOLE (FLAGYL) IVPB 500 mg  Status:  Discontinued     500 mg 100 mL/hr over 60 Minutes Intravenous Every 8 hours 08/15/15 2310 08/16/15 0636   08/15/15 2130  ciprofloxacin (CIPRO) IVPB 400 mg     400 mg 200 mL/hr over 60 Minutes Intravenous  Once 08/15/15 2123 08/15/15 2317   08/15/15 2130  metroNIDAZOLE (FLAGYL) IVPB 500 mg     500 mg 100 mL/hr over 60 Minutes Intravenous  Once 08/15/15 2123 08/15/15 2317          Objective:   Filed Vitals:   08/18/15 1818 08/18/15 2005 08/19/15 0511 08/19/15 0931  BP: 133/61 105/62 118/56 109/59  Pulse: 90 100 74 79  Temp:  98.6 F (37 C) 97.7 F (36.5 C) 98.2 F (36.8 C)  TempSrc:    Axillary  Resp: 18 18 18 18   Height:      Weight:   109.77 kg (242 lb)   SpO2:  98% 97% 96%    Wt Readings from Last 3 Encounters:  08/19/15 109.77 kg (242 lb)  09/23/14 112 kg (246 lb 14.6 oz)  09/22/14 113.5 kg (250 lb 3.6 oz)     Intake/Output Summary (Last 24 hours) at 08/19/15 1120 Last data filed  at 08/19/15 0932  Gross per 24 hour  Intake    240 ml  Output      1 ml  Net    239 ml     Physical Exam  Awake Alert, Oriented X 3, No new F.N deficits, Normal affect [place].AT,PERRAL Supple Neck,No JVD, No cervical lymphadenopathy appriciated.  Symmetrical Chest wall movement, Good air movement bilaterally, CTAB RRR,No Gallops,Rubs or new Murmurs, No Parasternal Heave +ve B.Sounds, Abd Soft, minimal tenderness, No organomegaly  appriciated. No Cyanosis, Clubbing or edema, No new Rash or bruise     Data Review   Micro Results Recent Results (from the past 240 hour(s))  C difficile quick scan w PCR reflex     Status: None   Collection Time: 08/15/15 11:14 PM  Result Value Ref Range Status   C Diff antigen NEGATIVE NEGATIVE Final   C Diff toxin NEGATIVE NEGATIVE Final   C Diff interpretation Negative for toxigenic C. difficile  Final    Radiology Reports Ct Abdomen Pelvis Wo Contrast  08/15/2015   CLINICAL DATA:  Generalized abdominal pain, chronic but worsening over the past several days  EXAM: CT ABDOMEN AND PELVIS WITHOUT CONTRAST  TECHNIQUE: Multidetector CT imaging of the abdomen and pelvis was performed following the standard protocol without IV contrast.  COMPARISON:  09/30/2014  FINDINGS: Lower chest: Left greater than right patchy dependent bibasilar consolidation is noted. Trace pleural effusions. Early pneumonia could appear similar to atelectasis.  Hepatobiliary: Unenhanced liver and gallbladder appear normal.  Pancreas: Normal  Spleen: Normal  Adrenals/Urinary Tract: Bilateral renal atrophy with low-density renal cortical cysts reidentified. No hydroureteronephrosis. No radiopaque renal or ureteral calculus. The bladder is decompressed.  Stomach/Bowel: There is short segmental terminal ileal wall thickening with mild surrounding stranding and abnormal hypodense thickening of this cecum wall. No pericolonic fluid collection to suggest abscess.  Vascular/Lymphatic: Vascular  collaterals are present within the anterior abdominal wall. No aortic aneurysm. No lymphadenopathy. Lower extremity vascular bypass is partly visualized.  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 Uterus and ovaries appear unremarkable.  Musculoskeletal: Bilateral L5 pars interarticularis defects. No acute osseous abnormality.  IMPRESSION: Interval increase in terminal ileal wall thickening with contiguous abnormal cecal mural low density most likely indicating inflammatory bowel disease such as Crohn's disease. Colitis/enteritis could appear similar. The appearance is much less typical for ischemia. Lymphoma could appear similar given the presumed previous history of immunosuppression but is less likely.  Probable dependent bibasilar atelectasis. Early pneumonia could appear similar.   Electronically Signed   By: Conchita Paris M.D.   On: 08/15/2015 21:18   Ir Angio Av Shunt Addl Access  08/19/2015   CLINICAL DATA:  49 year old female with a long history of end-stage renal disease on hemodialysis. She currently has dialysis via a right upper extremity HERO graft. The graft is currently clotted. Per the patient, she had issues with the graft clotting earlier this month and [facility] by Dr. Augustin Coupe.  EXAM: IR RIGHT DECLOT; IR ULTRASOUND GUIDANCE VASC ACCESS RIGHT; ARTERIOVENOUS SHUNT FOR DIALYSIS; ADDITIONAL ACCESS  Date: 08/19/2015  PROCEDURE: 1. Ultrasound-guided antegrade puncture of arteriovenous graft 2. Ultrasound-guided retrograde puncture of arteriovenous graft 3. Pharmacomechanical thrombolysis/thrombectomy procedure 4. Shuntogram Interventional Radiologist:  Criselda Peaches, MD  ANESTHESIA/SEDATION: Moderate (conscious) sedation was used. 1.5 mg Versed, 75 mcg Fentanyl were administered intravenously. The patient's vital signs were monitored continuously by radiology nursing throughout the procedure.  Sedation Time: 35 minutes  MEDICATIONS: None  additional  FLUOROSCOPY TIME:  7 minutes 54 seconds  185.8 mGy  CONTRAST:  32mL OMNIPAQUE IOHEXOL 300 MG/ML  SOLN  TECHNIQUE: Informed consent was obtained from the patient following explanation of the procedure, risks, benefits and alternatives. The patient understands, agrees and consents for the procedure. All questions were addressed. A time out was performed.  Maximal barrier sterile technique utilized including caps, mask, sterile gowns, sterile gloves, large sterile drape, hand hygiene, and Betadine skin prep.  The right upper extremity  arteriovenous graft was interrogated with ultrasound. The graft is completely thrombosed. A suitable antegrade puncture site was identified. Local anesthesia was attained by infiltration with 1% lidocaine. Under real-time ultrasound guidance, the graft was punctured with a 21 gauge micropuncture needle. An image was obtained and stored for the medical record. A transitional 5 French micro sheath was advanced over a micro wire.  A suitable retrograde puncture site was next identified. Again, local anesthesia was attained by infiltration with 1% lidocaine. Under real-time sonographic guidance, the graft was punctured with a 21 gauge micropuncture needle. Again, a transitional 5 [place] micro sheath was advanced over a micro wire. A total of 4 mg tPA was then injected into the thrombus through the 2 sheaths. This sheath were then exchanged over a short Amplatz wire for working 6 [place] vascular sheath. A 5 [place] Kumpe the catheter was navigated through the retrograde sheath and into the arterial anastomosis. The artery is widely patent. This also identified the site of the arterial anastomosis. A tear full a device was then advanced from antegrade access into the distal portion of the catheter. Mechanical thrombectomy was then performed throughout the length of the catheter and the venous limb of the graft. The catheter was then inserted through the retrograde sheath and  carefully placed through the arterial anastomosis. The thrombectomy device was then used in basket fashion to pull the arterial plug back into the arterial limb of the graft. This was then macerated with the device. Aspiration was performed through the two 6 French sheaths. Abundant clot was aspirated. Free bleed back through the sheath confirmed restoration of flow. The Kumpe the catheter was then advanced into the arterial anastomosis and a shuntogram was performed. The arterial anastomosis is widely patent. There is no residual thrombus. Flow is brisk.  The sheaths were removed and hemostasis attained with the assistance of 2-0 nylon pursestring sutures. The patient tolerated the procedure well.  COMPLICATIONS: None  Estimated blood loss: Approximately 100 mL  IMPRESSION: 1. Completely thrombosed hero graft. 2. Successful pharmaco mechanical thrombolysis/thrombectomy procedure. There is no evidence of inciting stenosis or other abnormality to explain thrombosis of this access. 3. Flow is restored and brisk.  May resume hemodialysis as needed.  Signed,  Criselda Peaches, MD  [facility]  [facility]   Electronically Signed   By: Jacqulynn Cadet M.D.   On: 08/19/2015 09:13   Ir US Guide Vasc Access Right  08/19/2015   CLINICAL DATA:  49 year old female with a long history of end-stage renal disease on hemodialysis. She currently has dialysis via a right upper extremity HERO graft. The graft is currently clotted. Per the patient, she had issues with the graft clotting earlier this month and was [facility] by Dr. Augustin Coupe.  EXAM: IR RIGHT DECLOT; IR ULTRASOUND GUIDANCE VASC ACCESS RIGHT; ARTERIOVENOUS SHUNT FOR DIALYSIS; ADDITIONAL ACCESS  Date: 08/19/2015  PROCEDURE: 1. Ultrasound-guided antegrade puncture of arteriovenous graft 2. Ultrasound-guided retrograde puncture of arteriovenous graft 3. Pharmacomechanical  thrombolysis/thrombectomy procedure 4. Shuntogram Interventional Radiologist:  Criselda Peaches, MD  ANESTHESIA/SEDATION: Moderate (conscious) sedation was used. 1.5 mg Versed, 75 mcg Fentanyl were administered intravenously. The patient's vital signs were monitored continuously by radiology nursing throughout the procedure.  Sedation Time: 35 minutes  MEDICATIONS: None additional  FLUOROSCOPY TIME:  7 minutes 54 seconds  185.8 mGy  CONTRAST:  56mL OMNIPAQUE IOHEXOL 300 MG/ML  SOLN  TECHNIQUE: Informed consent was obtained from the patient following explanation of  the procedure, risks, benefits and alternatives. The patient understands, agrees and consents for the procedure. All questions were addressed. A time out was performed.  Maximal barrier sterile technique utilized including caps, mask, sterile gowns, sterile gloves, large sterile drape, hand hygiene, and Betadine skin prep.  The right upper extremity arteriovenous graft was interrogated with ultrasound. The graft is completely thrombosed. A suitable antegrade puncture site was identified. Local anesthesia was attained by infiltration with 1% lidocaine. Under real-time ultrasound guidance, the graft was punctured with a 21 gauge micropuncture needle. An image was obtained and stored for the medical record. A transitional 5 French micro sheath was advanced over a micro wire.  A suitable retrograde puncture site was next identified. Again, local anesthesia was attained by infiltration with 1% lidocaine. Under real-time sonographic guidance, the graft was punctured with a 21 gauge micropuncture needle. Again, a transitional 5 [place] micro sheath was advanced over a micro wire. A total of 4 mg tPA was then injected into the thrombus through the 2 sheaths. This sheath were then exchanged over a short Amplatz wire for working 6 [place] vascular sheath. A 5 [place] Kum[place] the catheter was navigated through the retrograde sheath and into the arterial anastomosis.  The artery is widely patent. This also identified the site of the arterial anastomosis. A tear full a device was then advanced from antegrade access into the distal portion of the catheter. Mechanical thrombectomy was then performed throughout the length of the catheter and the venous limb of the graft. The catheter was then inserted through the retrograde sheath and carefully placed through the arterial anastomosis. The thrombectomy device was then used in basket fashion to pull the arterial plug back into the arterial limb of the graft. This was then macerated with the device. Aspiration was performed through the two 6 French sheaths. Abundant clot was aspirated. Free bleed back through the sheath confirmed restoration of flow. The Kumpe the catheter was then advanced into the arterial anastomosis and a shuntogram was performed. The arterial anastomosis is widely patent. There is no residual thrombus. Flow is brisk.  The sheaths were removed and hemostasis attained with the assistance of 2-0 nylon pursestring sutures. The patient tolerated the procedure well.  COMPLICATIONS: None  Estimated blood loss: Approximately 100 mL  IMPRESSION: 1. Completely thrombosed hero graft. 2. Successful pharmaco mechanical thrombolysis/thrombectomy procedure. There is no evidence of inciting stenosis or other abnormality to explain thrombosis of this access. 3. Flow is restored and brisk.  May resume hemodialysis as needed.  Signed,  Criselda Peaches, MD  [facility]  [facility]   Electronically Signed   By: Jacqulynn Cadet M.D.   On: 08/19/2015 09:13   Ir Declot Right Mod Sed  08/19/2015   CLINICAL DATA:  49 year old female with a long history of end-stage renal disease on hemodialysis. She currently has dialysis via a right upper extremity HERO graft. The graft is currently clotted. Per the patient, she had issues with the graft clotting earlier this month and was treated at  [facility] by Dr. Augustin Coupe.  EXAM: IR RIGHT DECLOT; IR ULTRASOUND GUIDANCE VASC ACCESS RIGHT; ARTERIOVENOUS SHUNT FOR DIALYSIS; ADDITIONAL ACCESS  Date: 08/19/2015  PROCEDURE: 1. Ultrasound-guided antegrade puncture of arteriovenous graft 2. Ultrasound-guided retrograde puncture of arteriovenous graft 3. Pharmacomechanical thrombolysis/thrombectomy procedure 4. Shuntogram Interventional Radiologist:  Criselda Peaches, MD  ANESTHESIA/SEDATION: Moderate (conscious) sedation was used. 1.5 mg Versed, 75 mcg Fentanyl were administered intravenously. The patient's vital signs were  monitored continuously by radiology nursing throughout the procedure.  Sedation Time: 35 minutes  MEDICATIONS: None additional  FLUOROSCOPY TIME:  7 minutes 54 seconds  185.8 mGy  CONTRAST:  31mL OMNIPAQUE IOHEXOL 300 MG/ML  SOLN  TECHNIQUE: Informed consent was obtained from the patient following explanation of the procedure, risks, benefits and alternatives. The patient understands, agrees and consents for the procedure. All questions were addressed. A time out was performed.  Maximal barrier sterile technique utilized including caps, mask, sterile gowns, sterile gloves, large sterile drape, hand hygiene, and Betadine skin prep.  The right upper extremity arteriovenous graft was interrogated with ultrasound. The graft is completely thrombosed. A suitable antegrade puncture site was identified. Local anesthesia was attained by infiltration with 1% lidocaine. Under real-time ultrasound guidance, the graft was punctured with a 21 gauge micropuncture needle. An image was obtained and stored for the medical record. A transitional 5 French micro sheath was advanced over a micro wire.  A suitable retrograde puncture site was next identified. Again, local anesthesia was attained by infiltration with 1% lidocaine. Under real-time sonographic guidance, the graft was punctured with a 21 gauge micropuncture needle. Again, a transitional  5 [place] micro sheath was advanced over a micro wire. A total of 4 mg tPA was then injected into the thrombus through the 2 sheaths. This sheath were then exchanged over a short Amplatz wire for working 6 [place] vascular sheath. A 5 [place] Kum[place] the catheter was navigated through the retrograde sheath and into the arterial anastomosis. The artery is widely patent. This also identified the site of the arterial anastomosis. A tear full a device was then advanced from antegrade access into the distal portion of the catheter. Mechanical thrombectomy was then performed throughout the length of the catheter and the venous limb of the graft. The catheter was then inserted through the retrograde sheath and carefully placed through the arterial anastomosis. The thrombectomy device was then used in basket fashion to pull the arterial plug back into the arterial limb of the graft. This was then macerated with the device. Aspiration was performed through the two 6 French sheaths. Abundant clot was aspirated. Free bleed back through the sheath confirmed restoration of flow. The Kumpe the catheter was then advanced into the arterial anastomosis and a shuntogram was performed. The arterial anastomosis is widely patent. There is no residual thrombus. Flow is brisk.  The sheaths were removed and hemostasis attained with the assistance of 2-0 nylon pursestring sutures. The patient tolerated the procedure well.  COMPLICATIONS: None  Estimated blood loss: Approximately 100 mL  IMPRESSION: 1. Completely thrombosed hero graft. 2. Successful pharmaco mechanical thrombolysis/thrombectomy procedure. There is no evidence of inciting stenosis or other abnormality to explain thrombosis of this access. 3. Flow is restored and brisk.  May resume hemodialysis as needed.  Signed,  Criselda Peaches, MD  [facility]  [facility]   Electronically Signed   By: Jacqulynn Cadet M.D.   On: 08/19/2015  09:13     CBC  Recent Labs Lab 08/15/15 1609 08/16/15 0241 08/16/15 1500 08/18/15 0518  WBC 10.3 10.2 9.0 7.3  HGB 12.3 10.7* 10.3* 10.2*  HCT 37.7 32.9* 31.1* 32.2*  PLT 185 172 210 252  MCV 98.2 96.8 95.1 97.9  MCH 32.0 31.5 31.5 31.0  MCHC 32.6 32.5 33.1 31.7  RDW 16.0* 15.7* 15.4 15.4  LYMPHSABS 1.0 1.2  --   --   MONOABS 0.7 0.8  --   --  EOSABS 0.2 0.3  --   --   BASOSABS 0.0 0.0  --   --     Chemistries   Recent Labs Lab 08/15/15 1609 08/16/15 0241 08/16/15 1500 08/18/15 0518  NA 134* 130* 132* 138  K 4.1 4.2 4.6 4.0  CL 93* 91* 95* 97*  CO2 22 20* 20* 22  GLUCOSE 86 88 83 88  BUN 68* 73* 80* 40*  CREATININE 16.24* 16.75* 17.49* 15.20*  CALCIUM 8.1* 7.7* 7.7* 8.2*  AST 24 16  --   --   ALT 17 16  --   --   ALKPHOS 106 94  --   --   BILITOT 1.2 1.0  --   --    ------------------------------------------------------------------------------------------------------------------ estimated creatinine clearance is 5.6 mL/min (by C-G formula based on Cr of 15.2). ------------------------------------------------------------------------------------------------------------------ No results for input(s): HGBA1C in the last 72 hours. ------------------------------------------------------------------------------------------------------------------ No results for input(s): CHOL, HDL, LDLCALC, TRIG, CHOLHDL, LDLDIRECT in the last 72 hours. ------------------------------------------------------------------------------------------------------------------ No results for input(s): TSH, T4TOTAL, T3FREE, THYROIDAB in the last 72 hours.  Invalid input(s): FREET3 ------------------------------------------------------------------------------------------------------------------ No results for input(s): VITAMINB12, FOLATE, FERRITIN, TIBC, IRON, RETICCTPCT in the last 72 hours.  Coagulation profile  Recent Labs Lab 08/17/15 1120  INR 1.45    No results for input(s):  DDIMER in the last 72 hours.  Cardiac Enzymes No results for input(s): CKMB, TROPONINI, MYOGLOBIN in the last 168 hours.  Invalid input(s): CK ------------------------------------------------------------------------------------------------------------------ Invalid input(s): POCBNP     Time Spent in minutes   30 minutes    Saba Neuman M.D on 08/19/2015 at 11:20 AM  Between 7am to 7pm - Pager - [phone]  After 7pm go to [email or web address] - password [facility]  [facility]   Office  [phone]

## 2015-08-19 NOTE — Sedation Documentation (Addendum)
Dr Pascal Lux in to speak with pt. In nurses station

## 2015-08-20 ENCOUNTER — Encounter (HOSPITAL_COMMUNITY): Payer: Self-pay | Admitting: Gastroenterology

## 2015-08-20 LAB — GLUCOSE, CAPILLARY
GLUCOSE-CAPILLARY: 68 mg/dL (ref 65–99)
GLUCOSE-CAPILLARY: 100 mg/dL — AB (ref 65–99)
GLUCOSE-CAPILLARY: 142 mg/dL — AB (ref 65–99)
Glucose-Capillary: 92 mg/dL (ref 65–99)
Glucose-Capillary: 88 mg/dL (ref 65–99)
Glucose-Capillary: 123 mg/dL — ABNORMAL HIGH (ref 65–99)

## 2015-08-20 MED ORDER — OXYCODONE-ACETAMINOPHEN 5-325 MG PO TABS
1.0000 | ORAL_TABLET | ORAL | Status: DC | PRN
  Administered 2015-08-20 – 2015-08-21 (×2): 2 via ORAL
  Filled 2015-08-20 (×3): qty 2

## 2015-08-20 MED ORDER — MIDODRINE HCL 5 MG PO TABS
ORAL_TABLET | ORAL | Status: AC
  Filled 2015-08-20: qty 2

## 2015-08-20 MED ORDER — CALCITRIOL 0.25 MCG PO CAPS
ORAL_CAPSULE | ORAL | Status: AC
  Filled 2015-08-20: qty 3

## 2015-08-20 MED ORDER — DARBEPOETIN ALFA 100 MCG/0.5ML IJ SOSY: 100.0000 ug | PREFILLED_SYRINGE | INTRAMUSCULAR | Status: DC

## 2015-08-20 NOTE — Progress Notes (Signed)
[facility] Progress Note  Assessment/Plan: 1. Abdominal pain -colonoscopy showed ulcerated ileocecal valve tissue-less likely infections, no evidence of dysplasia or malignancy not c/w IBD per Dr. Hilarie Fredrickson via path - plan to follow conservatively and rescope her in a month to see if problem has resolved 2. ESRD - TTS - missed HD Tuesday - had Wed for 3 hour; missed Thursday HD due to clotted access - had declot Friday and then found to be clotted again Friday night so not dialyzed. [place] placed Saturday and had HD afterwards.  Next HD planned for Tuesday 3. BP/volume - midodrine - for BP support with hx of adrenal insufficiency., Net UF Wed 2.9 with post weight 109.7 - still above EDW; Keep BP >90 due to #1; UF volume not recorded for Saturday nor was post weight done. 4. Anemia - Hgb 10.2 - continue Fe and ESA- missed dose - rescheduled for Tueday - will notify her HD unit 5. Metabolic bone disease - Ca 8.2 -continue calcitriol and binders 6. Nutrition - Alb 3.1 - still on CL until surgery - pt agrees to try to drink 3 Resource per day to help with fluid 7. Adrenal insufficiency -on fludrocortisone - generally not as effective as florinef,, though she has been on this for quite some time; consider changing to different steroid a 8. Clotted right upper HERO - clotted 8/22 and 9/2 with reclot after 9/2 declot - left TDC placed femoral 9/3; needs to follow up with primary physician who placed HERO to see if it is in any way salvageble. 9. Disp - plan d/c today  Myriam Jacobson, PA-C [place] [phone] 08/20/2015,8:07 AM  LOS: 5 days   Subjective:   Doing well today. Exhausted last night. Got off HD at 130  Objective Filed Vitals:   08/20/15 0030 08/20/15 0100 08/20/15 0152 08/20/15 0500  BP: 93/65 92/58 95/63  91/61  Pulse: 76 80 103 91  Temp:   97.7 F (36.5 C) 98.2 F (36.8 C)  TempSrc:      Resp:   18 19  Height:      Weight:      SpO2:   100%  93%   Physical Exam General: NAD up and about in room Heart: RRR Lungs: no rales Abdomen: soft obese, NT Extremities: no edema Dialysis Access: right upper HERO - no bruit; left fem TDC, some bleeding on dressing  Dialysis Orders: [facility] 4 hr 450/800 right HERO 2 K 2 Ca profile heparin 3000 with 3000 min tmt, Mircera 75 last 8/16 venofer 100 x 5 start 9/1 calcitriol 0.75 Recent labs: Hgb 11.2 8/25 15% sat ferritin 1133 July 23% sat July iPTH 456 coming down with normal Ca/P  Additional Objective Labs: Basic Metabolic Panel:  Recent Labs Lab 08/16/15 0241 08/16/15 1500 08/18/15 0518  NA 130* 132* 138  K 4.2 4.6 4.0  CL 91* 95* 97*  CO2 20* 20* 22  GLUCOSE 88 83 88  BUN 73* 80* 40*  CREATININE 16.75* 17.49* 15.20*  CALCIUM 7.7* 7.7* 8.2*  PHOS  --  7.6*  --    Liver Function Tests:  Recent Labs Lab 08/15/15 1609 08/16/15 0241 08/16/15 1500  AST 24 16  --   ALT 17 16  --   ALKPHOS 106 94  --   BILITOT 1.2 1.0  --   PROT 7.2 6.5  --   ALBUMIN 3.8 3.3* 3.1*    Recent Labs Lab 08/15/15 1609  LIPASE 23  CBC:  Recent Labs Lab 08/15/15 1609 08/16/15 0241 08/16/15 1500 08/18/15 0518  WBC 10.3 10.2 9.0 7.3  NEUTROABS 8.4* 7.9*  --   --   HGB 12.3 10.7* 10.3* 10.2*  HCT 37.7 32.9* 31.1* 32.2*  MCV 98.2 96.8 95.1 97.9  PLT 185 172 210 252   Blood Culture    Component Value Date/Time   SDES BLOOD RIGHT ARM 05/08/2008 0930   SPECREQUEST BOTTLES DRAWN AEROBIC AND ANAEROBIC 5CC 05/08/2008 0930   CULT NO GROWTH 5 DAYS 05/08/2008 0930   REPTSTATUS 05/14/2008 FINAL 05/08/2008 0930    Cardiac Enzymes: No results for input(s): CKTOTAL, CKMB, CKMBINDEX, TROPONINI in the last 168 hours. CBG:  Recent Labs Lab 08/19/15 1222 08/19/15 1706 08/20/15 0150 08/20/15 0512 08/20/15 0742  GLUCAP 73 80 68 92 88   Iron Studies: No results for input(s): IRON, TIBC, TRANSFERRIN, FERRITIN in the last 72 hours. @lablastinr3 @ Studies/Results: Ir Fluoro Guide Cv  Line Left  08/19/2015   INDICATION: Failed attempted declot of right upper arm hero graft, in need of intravenous access for continuation of dialysis. Most recent history of left common femoral vein approach dialysis catheter placement.  EXAM: TUNNELED CENTRAL VENOUS HEMODIALYSIS CATHETER PLACEMENT WITH ULTRASOUND AND FLUOROSCOPIC GUIDANCE  COMPARISON:  Attempted right upper arm hero graft declot- 08/18/2015; left femoral vein approach dialysis catheter placement - 09/21/2014.  MEDICATIONS: Ancef 2 gm IV; The IV antibiotic was given in an appropriate time interval prior to skin puncture.  CONTRAST:  None  ANESTHESIA/SEDATION: Versed 2 mg IV; Fentanyl 100 mcg IV  Total Moderate Sedation Time  25 minutes.  FLUOROSCOPY TIME:  1 minute 18 seconds (47.0 mGy)  COMPLICATIONS: None immediate  PROCEDURE: Informed written consent was obtained from the patient after a discussion of the risks, benefits, and alternatives to treatment. Questions regarding the procedure were encouraged and answered. The left groin was prepped with chlorhexidine in a sterile fashion, and a sterile drape was applied covering the operative field. Maximum barrier sterile technique with sterile gowns and gloves were used for the procedure. A timeout was performed prior to the initiation of the procedure.  After creating a small venotomy incision, a micropuncture kit was utilized to access the left common femoral vein vein under direct, real-time ultrasound guidance after the overlying soft tissues were anesthetized with 1% lidocaine with epinephrine. Ultrasound image documentation was performed. The microwire was kinked to measure appropriate catheter length. A stiff Glidewire was advanced to the level of the IVC and the micropuncture sheath was exchanged for a peel-away sheath. A AshSplit tunneled hemodialysis catheter measuring 55 cm from tip to cuff was tunneled in a retrograde fashion from the left lateral thigh to the venotomy incision.  The  catheter was then placed through the peel-away sheath with tips ultimately positioned within the inferior aspect of the right atrium. Final catheter positioning was confirmed and documented with a spot radiographic image. The catheter aspirates and flushes normally. The catheter was flushed with appropriate volume heparin dwells.  The catheter exit site was secured with a 0-Prolene retention suture. The venotomy incision was closed with an interrupted 4-0 Vicryl, Dermabond and Steri-strips. Dressings were applied. The patient tolerated the procedure well without immediate post procedural complication.  IMPRESSION: Successful placement of 55 cm tip to cuff tunneled hemodialysis catheter via the left common femoral vein vein with tips terminating within the inferior aspect of the right atrium. The catheter is ready for immediate use.   Electronically Signed   By: Eldridge Abrahams.D.  On: 08/19/2015 15:59   Ir Angio Av Shunt Addl Access  08/19/2015   CLINICAL DATA:  49 year old female with a long history of end-stage renal disease on hemodialysis. She currently has dialysis via a right upper extremity HERO graft. The graft is currently clotted. Per the patient, she had issues with the graft clotting earlier this month and [facility] by Dr. Augustin Coupe.  EXAM: IR RIGHT DECLOT; IR ULTRASOUND GUIDANCE VASC ACCESS RIGHT; ARTERIOVENOUS SHUNT FOR DIALYSIS; ADDITIONAL ACCESS  Date: 08/19/2015  PROCEDURE: 1. Ultrasound-guided antegrade puncture of arteriovenous graft 2. Ultrasound-guided retrograde puncture of arteriovenous graft 3. Pharmacomechanical thrombolysis/thrombectomy procedure 4. Shuntogram Interventional Radiologist:  Criselda Peaches, MD  ANESTHESIA/SEDATION: Moderate (conscious) sedation was used. 1.5 mg Versed, 75 mcg Fentanyl were administered intravenously. The patient's vital signs were monitored continuously by radiology nursing throughout the procedure.  Sedation Time: 35  minutes  MEDICATIONS: None additional  FLUOROSCOPY TIME:  7 minutes 54 seconds  185.8 mGy  CONTRAST:  29m OMNIPAQUE IOHEXOL 300 MG/ML  SOLN  TECHNIQUE: Informed consent was obtained from the patient following explanation of the procedure, risks, benefits and alternatives. The patient understands, agrees and consents for the procedure. All questions were addressed. A time out was performed.  Maximal barrier sterile technique utilized including caps, mask, sterile gowns, sterile gloves, large sterile drape, hand hygiene, and Betadine skin prep.  The right upper extremity arteriovenous graft was interrogated with ultrasound. The graft is completely thrombosed. A suitable antegrade puncture site was identified. Local anesthesia was attained by infiltration with 1% lidocaine. Under real-time ultrasound guidance, the graft was punctured with a 21 gauge micropuncture needle. An image was obtained and stored for the medical record. A transitional 5 French micro sheath was advanced over a micro wire.  A suitable retrograde puncture site was next identified. Again, local anesthesia was attained by infiltration with 1% lidocaine. Under real-time sonographic guidance, the graft was punctured with a 21 gauge micropuncture needle. Again, a transitional 5 [place] sheath was advanced over a micro wire. A total of 4 mg tPA was then injected into the thrombus through the 2 sheaths. This sheath were then exchanged over a short Amplatz wire for working 6 [place] sheath. A 5 [place] the catheter was navigated through the retrograde sheath and into the arterial anastomosis. The artery is widely patent. This also identified the site of the arterial anastomosis. A tear full a device was then advanced from antegrade access into the distal portion of the catheter. Mechanical thrombectomy was then performed throughout the length of the catheter and the venous limb of the graft. The catheter was then inserted through the  retrograde sheath and carefully placed through the arterial anastomosis. The thrombectomy device was then used in basket fashion to pull the arterial plug back into the arterial limb of the graft. This was then macerated with the device. Aspiration was performed through the two 6 French sheaths. Abundant clot was aspirated. Free bleed back through the sheath confirmed restoration of flow. The Kumpe the catheter was then advanced into the arterial anastomosis and a shuntogram was performed. The arterial anastomosis is widely patent. There is no residual thrombus. Flow is brisk.  The sheaths were removed and hemostasis attained with the assistance of 2-0 nylon pursestring sutures. The patient tolerated the procedure well.  COMPLICATIONS: None  Estimated blood loss: Approximately 100 mL  IMPRESSION: 1. Completely thrombosed hero graft. 2. Successful pharmaco mechanical thrombolysis/thrombectomy procedure. There is no evidence of inciting  stenosis or other abnormality to explain thrombosis of this access. 3. Flow is restored and brisk.  May resume hemodialysis as needed.  Signed,  Criselda Peaches, MD  [facility]  [facility]   Electronically Signed   By: Jacqulynn Cadet M.D.   On: 08/19/2015 09:13   Ir US Guide Vasc Access Left  08/19/2015   INDICATION: Failed attempted declot of right upper arm hero graft, in need of intravenous access for continuation of dialysis. Most recent history of left common femoral vein approach dialysis catheter placement.  EXAM: TUNNELED CENTRAL VENOUS HEMODIALYSIS CATHETER PLACEMENT WITH ULTRASOUND AND FLUOROSCOPIC GUIDANCE  COMPARISON:  Attempted right upper arm hero graft declot- 08/18/2015; left femoral vein approach dialysis catheter placement - 09/21/2014.  MEDICATIONS: Ancef 2 gm IV; The IV antibiotic was given in an appropriate time interval prior to skin puncture.  CONTRAST:  None  ANESTHESIA/SEDATION: Versed 2 mg IV; Fentanyl  100 mcg IV  Total Moderate Sedation Time  25 minutes.  FLUOROSCOPY TIME:  1 minute 18 seconds (82.9 mGy)  COMPLICATIONS: None immediate  PROCEDURE: Informed written consent was obtained from the patient after a discussion of the risks, benefits, and alternatives to treatment. Questions regarding the procedure were encouraged and answered. The left groin was prepped with chlorhexidine in a sterile fashion, and a sterile drape was applied covering the operative field. Maximum barrier sterile technique with sterile gowns and gloves were used for the procedure. A timeout was performed prior to the initiation of the procedure.  After creating a small venotomy incision, a micropuncture kit was utilized to access the left common femoral vein vein under direct, real-time ultrasound guidance after the overlying soft tissues were anesthetized with 1% lidocaine with epinephrine. Ultrasound image documentation was performed. The microwire was kinked to measure appropriate catheter length. A stiff Glidewire was advanced to the level of the IVC and the micropuncture sheath was exchanged for a peel-away sheath. A AshSplit tunneled hemodialysis catheter measuring 55 cm from tip to cuff was tunneled in a retrograde fashion from the left lateral thigh to the venotomy incision.  The catheter was then placed through the peel-away sheath with tips ultimately positioned within the inferior aspect of the right atrium. Final catheter positioning was confirmed and documented with a spot radiographic image. The catheter aspirates and flushes normally. The catheter was flushed with appropriate volume heparin dwells.  The catheter exit site was secured with a 0-Prolene retention suture. The venotomy incision was closed with an interrupted 4-0 Vicryl, Dermabond and Steri-strips. Dressings were applied. The patient tolerated the procedure well without immediate post procedural complication.  IMPRESSION: Successful placement of 55 cm tip to cuff  tunneled hemodialysis catheter via the left common femoral vein vein with tips terminating within the inferior aspect of the right atrium. The catheter is ready for immediate use.   Electronically Signed   By: Sandi Mariscal M.D.   On: 08/19/2015 15:59   Ir US Guide Vasc Access Right  08/19/2015   CLINICAL DATA:  49 year old female with a long history of end-stage renal disease on hemodialysis. She currently has dialysis via a right upper extremity HERO graft. The graft is currently clotted. Per the patient, she had issues with the graft clotting earlier this month and [facility] by Dr. Augustin Coupe.  EXAM: IR RIGHT DECLOT; IR ULTRASOUND GUIDANCE VASC ACCESS RIGHT; ARTERIOVENOUS SHUNT FOR DIALYSIS; ADDITIONAL ACCESS  Date: 08/19/2015  PROCEDURE: 1. Ultrasound-guided antegrade puncture of arteriovenous graft  2. Ultrasound-guided retrograde puncture of arteriovenous graft 3. Pharmacomechanical thrombolysis/thrombectomy procedure 4. Shuntogram Interventional Radiologist:  Criselda Peaches, MD  ANESTHESIA/SEDATION: Moderate (conscious) sedation was used. 1.5 mg Versed, 75 mcg Fentanyl were administered intravenously. The patient's vital signs were monitored continuously by radiology nursing throughout the procedure.  Sedation Time: 35 minutes  MEDICATIONS: None additional  FLUOROSCOPY TIME:  7 minutes 54 seconds  185.8 mGy  CONTRAST:  30m OMNIPAQUE IOHEXOL 300 MG/ML  SOLN  TECHNIQUE: Informed consent was obtained from the patient following explanation of the procedure, risks, benefits and alternatives. The patient understands, agrees and consents for the procedure. All questions were addressed. A time out was performed.  Maximal barrier sterile technique utilized including caps, mask, sterile gowns, sterile gloves, large sterile drape, hand hygiene, and Betadine skin prep.  The right upper extremity arteriovenous graft was interrogated with ultrasound. The graft is completely thrombosed. A  suitable antegrade puncture site was identified. Local anesthesia was attained by infiltration with 1% lidocaine. Under real-time ultrasound guidance, the graft was punctured with a 21 gauge micropuncture needle. An image was obtained and stored for the medical record. A transitional 5 French micro sheath was advanced over a micro wire.  A suitable retrograde puncture site was next identified. Again, local anesthesia was attained by infiltration with 1% lidocaine. Under real-time sonographic guidance, the graft was punctured with a 21 gauge micropuncture needle. Again, a transitional 5 [place] sheath was advanced over a micro wire. A total of 4 mg tPA was then injected into the thrombus through the 2 sheaths. This sheath were then exchanged over a short Amplatz wire for working 6 [place] sheath. A 5 [place] the catheter was navigated through the retrograde sheath and into the arterial anastomosis. The artery is widely patent. This also identified the site of the arterial anastomosis. A tear full a device was then advanced from antegrade access into the distal portion of the catheter. Mechanical thrombectomy was then performed throughout the length of the catheter and the venous limb of the graft. The catheter was then inserted through the retrograde sheath and carefully placed through the arterial anastomosis. The thrombectomy device was then used in basket fashion to pull the arterial plug back into the arterial limb of the graft. This was then macerated with the device. Aspiration was performed through the two 6 French sheaths. Abundant clot was aspirated. Free bleed back through the sheath confirmed restoration of flow. The Kumpe the catheter was then advanced into the arterial anastomosis and a shuntogram was performed. The arterial anastomosis is widely patent. There is no residual thrombus. Flow is brisk.  The sheaths were removed and hemostasis attained with the assistance of 2-0 nylon  pursestring sutures. The patient tolerated the procedure well.  COMPLICATIONS: None  Estimated blood loss: Approximately 100 mL  IMPRESSION: 1. Completely thrombosed hero graft. 2. Successful pharmaco mechanical thrombolysis/thrombectomy procedure. There is no evidence of inciting stenosis or other abnormality to explain thrombosis of this access. 3. Flow is restored and brisk.  May resume hemodialysis as needed.  Signed,  HCriselda Peaches MD  [facility]  [facility]   Electronically Signed   By: HJacqulynn CadetM.D.   On: 08/19/2015 09:13   Ir Declot Right Mod Sed  08/19/2015   CLINICAL DATA:  49year old female with a long history of end-stage renal disease on hemodialysis. She currently has dialysis via a right upper extremity HERO graft. The graft is currently clotted. Per the patient,  she had issues with the graft clotting earlier this month and was [facility] by Dr. Augustin Coupe.  EXAM: IR RIGHT DECLOT; IR ULTRASOUND GUIDANCE VASC ACCESS RIGHT; ARTERIOVENOUS SHUNT FOR DIALYSIS; ADDITIONAL ACCESS  Date: 08/19/2015  PROCEDURE: 1. Ultrasound-guided antegrade puncture of arteriovenous graft 2. Ultrasound-guided retrograde puncture of arteriovenous graft 3. Pharmacomechanical thrombolysis/thrombectomy procedure 4. Shuntogram Interventional Radiologist:  Criselda Peaches, MD  ANESTHESIA/SEDATION: Moderate (conscious) sedation was used. 1.5 mg Versed, 75 mcg Fentanyl were administered intravenously. The patient's vital signs were monitored continuously by radiology nursing throughout the procedure.  Sedation Time: 35 minutes  MEDICATIONS: None additional  FLUOROSCOPY TIME:  7 minutes 54 seconds  185.8 mGy  CONTRAST:  35m OMNIPAQUE IOHEXOL 300 MG/ML  SOLN  TECHNIQUE: Informed consent was obtained from the patient following explanation of the procedure, risks, benefits and alternatives. The patient understands, agrees and consents for the  procedure. All questions were addressed. A time out was performed.  Maximal barrier sterile technique utilized including caps, mask, sterile gowns, sterile gloves, large sterile drape, hand hygiene, and Betadine skin prep.  The right upper extremity arteriovenous graft was interrogated with ultrasound. The graft is completely thrombosed. A suitable antegrade puncture site was identified. Local anesthesia was attained by infiltration with 1% lidocaine. Under real-time ultrasound guidance, the graft was punctured with a 21 gauge micropuncture needle. An image was obtained and stored for the medical record. A transitional 5 French micro sheath was advanced over a micro wire.  A suitable retrograde puncture site was next identified. Again, local anesthesia was attained by infiltration with 1% lidocaine. Under real-time sonographic guidance, the graft was punctured with a 21 gauge micropuncture needle. Again, a transitional 5 [place] sheath was advanced over a micro wire. A total of 4 mg tPA was then injected into the thrombus through the 2 sheaths. This sheath were then exchanged over a short Amplatz wire for working 6 [place] sheath. A 5 [place] the catheter was navigated through the retrograde sheath and into the arterial anastomosis. The artery is widely patent. This also identified the site of the arterial anastomosis. A tear full a device was then advanced from antegrade access into the distal portion of the catheter. Mechanical thrombectomy was then performed throughout the length of the catheter and the venous limb of the graft. The catheter was then inserted through the retrograde sheath and carefully placed through the arterial anastomosis. The thrombectomy device was then used in basket fashion to pull the arterial plug back into the arterial limb of the graft. This was then macerated with the device. Aspiration was performed through the two 6 French sheaths. Abundant clot was aspirated. Free  bleed back through the sheath confirmed restoration of flow. The Kumpe the catheter was then advanced into the arterial anastomosis and a shuntogram was performed. The arterial anastomosis is widely patent. There is no residual thrombus. Flow is brisk.  The sheaths were removed and hemostasis attained with the assistance of 2-0 nylon pursestring sutures. The patient tolerated the procedure well.  COMPLICATIONS: None  Estimated blood loss: Approximately 100 mL  IMPRESSION: 1. Completely thrombosed hero graft. 2. Successful pharmaco mechanical thrombolysis/thrombectomy procedure. There is no evidence of inciting stenosis or other abnormality to explain thrombosis of this access. 3. Flow is restored and brisk.  May resume hemodialysis as needed.  Signed,  HCriselda Peaches MD  [facility]  [facility]   Electronically Signed   By: HDellis FilbertD.  On: 08/19/2015 09:13   Medications: . sodium chloride 1,000 mL (08/18/15 1001)   . aspirin EC  325 mg Oral Daily  . calcitRIOL      . calcitRIOL  0.75 mcg Oral Q T,Th,Sa-HD  . calcium acetate  2,001 mg Oral TID WC  . darbepoetin (ARANESP) injection - DIALYSIS  60 mcg Intravenous Q Fri-HD  . [START ON 09/21/15] darbepoetin (ARANESP) injection - DIALYSIS  60 mcg Intravenous Q Thu-HD  . feeding supplement  1 Container Oral TID BM  . ferric gluconate (FERRLECIT/NULECIT) IV  125 mg Intravenous Q T,Th,Sa-HD  . fludrocortisone  0.2 mg Oral Daily  . gabapentin  100 mg Oral QHS  . heparin subcutaneous  5,000 Units Subcutaneous 3 times per day  . midodrine      . midodrine  10 mg Oral TID WC  . sodium chloride  3 mL Intravenous Q12H

## 2015-08-20 NOTE — Progress Notes (Signed)
Daily Rounding Note  08/20/2015, 10:51 AM  LOS: 5 days   SUBJECTIVE:       Only pain is at dialysis catheter site in left thigh no abd pain, brown stools.  No nausea on clears.  Feels well.   Dr Hilarie Fredrickson spoke with Dr Rosendo Gros and they both agree that pt is not obstructed, that the ischemia and inflammation may resolve and surgery may not be necessary.  The plan is to advance diet, observe and unless having setbacks, repeat colonoscopy in a few weeks to assess the inflammatory mass.   OBJECTIVE:         Vital signs in last 24 hours:    Temp:  [97.7 F (36.5 C)-98.2 F (36.8 C)] 98.2 F (36.8 C) (09/04 0500) Pulse Rate:  [67-103] 91 (09/04 0500) Resp:  [18-26] 19 (09/04 0500) BP: (91-128)/(48-96) 91/61 mmHg (09/04 0500) SpO2:  [93 %-100 %] 93 % (09/04 0500) Weight:  [244 lb 7.8 oz (110.9 kg)] 244 lb 7.8 oz (110.9 kg) (09/03 2117) Last BM Date: 08/18/15 Filed Weights   08/17/15 1958 08/19/15 0511 08/19/15 2117  Weight: 242 lb 9.6 oz (110.043 kg) 242 lb (109.77 kg) 244 lb 7.8 oz (110.9 kg)   General: pleasant, comfortable   Heart: RRR Chest: clear bil Abdomen: soft, active BS, NT, ND  Extremities: no CCE.  Catheter site in left thigh with slightly bloody dressing but no bruising Neuro/Psych:  Pleasant.  Oriented x 3.  Fully alert.   Intake/Output from previous day: 09/03 0701 - 09/04 0700 In: 1200 [P.O.:1200] Out: 0   Intake/Output this shift:    Lab Results:  Recent Labs  08/18/15 0518  WBC 7.3  HGB 10.2*  HCT 32.2*  PLT 252   BMET  Recent Labs  08/18/15 0518  NA 138  K 4.0  CL 97*  CO2 22  GLUCOSE 88  BUN 40*  CREATININE 15.20*  CALCIUM 8.2*   LFT No results for input(s): PROT, ALBUMIN, AST, ALT, ALKPHOS, BILITOT, BILIDIR, IBILI in the last 72 hours. PT/INR  Recent Labs  08/17/15 1120  LABPROT 17.7*  INR 1.45   Scheduled Meds: . aspirin EC  325 mg Oral Daily  . calcitRIOL      .  calcitRIOL  0.75 mcg Oral Q T,Th,Sa-HD  . calcium acetate  2,001 mg Oral TID WC  . [START ON 08/22/2015] darbepoetin (ARANESP) injection - DIALYSIS  100 mcg Intravenous Q Tue-HD  . feeding supplement  1 Container Oral TID BM  . ferric gluconate (FERRLECIT/NULECIT) IV  125 mg Intravenous Q T,Th,Sa-HD  . fludrocortisone  0.2 mg Oral Daily  . gabapentin  100 mg Oral QHS  . heparin subcutaneous  5,000 Units Subcutaneous 3 times per day  . midodrine      . midodrine  10 mg Oral TID WC  . sodium chloride  3 mL Intravenous Q12H   Continuous Infusions: . sodium chloride 1,000 mL (08/18/15 1001)   PRN Meds:.acetaminophen **OR** acetaminophen, fentaNYL (SUBLIMAZE) injection, iohexol, ondansetron **OR** ondansetron (ZOFRAN) IV   ASSESMENT:   * Abdominal pain with dark stool. Terminal ileal/cecal inflammation per CT.  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 Ulcerations in ascending colon. Multiple biopsies performed. Sigmoid tics, grade 1 int rrhoids.  Prelim pathology revealing ischemic injury.  General surgery planning colectomy on 9/6.   * ESRD. Transplanted 2000, failed 2007. On transplant list at [facility]. S/p 9/2 IR thrombolysis, thrombectomy of clotted graft.   *  Anemia. Normocytic. Requires periodic parenteral iron given at HD. Says she has not been receiving epogen like infusions in several months.   * Hyponatremia  * Chronic Plavix for hx vascular thrombosis. Last dose was 8/29.   * Adrenal insufficiency with chronic hypotension. On Florinef.     PLAN   *  See above for plans to advance diet, observe and likely cxl colectomy set for 9/6.     Azucena Freed  08/20/2015, 10:51 AM Pager: [phone]

## 2015-08-20 NOTE — Progress Notes (Signed)
Patient ID: Judy Green, female   DOB: 08/05/1966, 49 y.o.   MRN: [id number] No surgical plans for today.  Will see tomorrow and get her pre-oped for surgery on Tuesday.  Cont clear liquids  Breeanna Galgano E 8:59 AM 08/20/2015

## 2015-08-20 NOTE — Progress Notes (Signed)
Patient Demographics  Judy Green, is a 49 y.o. female, DOB - 05-20-1966, PRF:[id number]  Admit date - 08/15/2015   Admitting Physician Rise Patience, MD  Outpatient Primary MD for the patient is Kandice Hams, MD  LOS - 5   Chief Complaint  Patient presents with  . Abdominal Pain         Subjective:   Judy Green today has, No headache, No chest pain, No new weakness tingling or numbness, No Cough - SOB, reports abd pain has resolved, received hemodialysis yesterday evening.  Assessment & Plan    Principal Problem:   Colitis Active Problems:   ESRD on hemodialysis   Chronic hypotension   Abnormal CT scan, gastrointestinal tract   Anemia in chronic kidney disease   Clotted renal dialysis AV graft   ESRD (end stage renal disease)   Acute ischemic colitis  Abdominal pain and diarrhea - CT abdomen and pelvis showing colitis involving cecum and terminal ileal area. - GI consult appreciated, colonoscopy 08/18/15, finding significant for ulcerated cecal mass, biopsies Significant for ischemic finding, no evidence of malignancy , CEA within normal limits, discussed with GI and surgery , at this point will try to avoid surgical option , even though colonoscopy findings suggestive of obstructive mass, patient did not have any nausea or vomiting , no symptoms of obstruction , will start on soft diet and see if tolerates then can be followed as an outpatient with plan to repeat colonoscopy in a few weeks to reassess the inflammatory mass ,will monitor her course clinically , so we'll advance her diet today, and monitor clinically. - C. difficile negative  Questionable pneumonia on imaging - CT abdomen showing bibasilar infiltrate/atelectasis, no clinical evidence of pneumonia,  IV vancomycin and Zosyn  stopped .  Chronic hypotension - Continue with fludrocortisone and midodrine   End-stage renal disease - Nephrology consulted - On TTS schedule,  status post thrombolysis/thrombectomy of her HERO graft by IR 9/2, unfortunately still nonfunctional, temporary femoral access today by IR on 08/19/2015.  History of failed renal transplant  Anemia -  related to renal disease  Code Status: Full  Family Communication: Discussed with patient, none at bedside  Disposition Plan: Home in 24 hour if tolerates soft renal diet  Procedures: - Colonoscopy 08/18/59 - Thrombolysis of right upper extremity hero graft 9/2 by IR - Tunneled hemodialysis catheter IR 9/3  Consults Renal GI Surgery  Medications  Scheduled Meds: . aspirin EC  325 mg Oral Daily  . calcitRIOL  0.75 mcg Oral Q T,Th,Sa-HD  . calcium acetate  2,001 mg Oral TID WC  . [START ON 08/22/2015] darbepoetin (ARANESP) injection - DIALYSIS  100 mcg Intravenous Q Tue-HD  . feeding supplement  1 Container Oral TID BM  . ferric gluconate (FERRLECIT/NULECIT) IV  125 mg Intravenous Q T,Th,Sa-HD  . fludrocortisone  0.2 mg Oral Daily  . gabapentin  100 mg Oral QHS  . heparin subcutaneous  5,000 Units Subcutaneous 3 times per day  . midodrine  10 mg Oral TID WC  . sodium chloride  3 mL Intravenous Q12H   Continuous Infusions: . sodium chloride 1,000 mL (08/18/15 1001)   PRN Meds:.acetaminophen **OR** acetaminophen, fentaNYL (SUBLIMAZE) injection, iohexol, ondansetron **OR** ondansetron (ZOFRAN) IV, oxyCODONE-acetaminophen  DVT Prophylaxis   Heparin -  Lab Results  Component Value Date   PLT 252 08/18/2015    Antibiotics    Anti-infectives    Start     Dose/Rate Route Frequency Ordered Stop   08/19/15 1200  ceFAZolin (ANCEF) IVPB 2 g/50 mL premix     2 g 100 mL/hr over 30 Minutes Intravenous To Radiology 08/19/15 1051 08/19/15 1530   08/17/15 2000  ciprofloxacin (CIPRO) IVPB 400 mg  Status:  Discontinued     400 mg 200 mL/hr over 60 Minutes Intravenous Every 24 hours 08/17/15 1007 08/17/15 1437    08/17/15 1200  vancomycin (VANCOCIN) IVPB 1000 mg/200 mL premix  Status:  Discontinued     1,000 mg 200 mL/hr over 60 Minutes Intravenous Every T-Th-Sa (Hemodialysis) 08/16/15 0646 08/16/15 1220   08/17/15 1200  metroNIDAZOLE (FLAGYL) IVPB 500 mg  Status:  Discontinued     500 mg 100 mL/hr over 60 Minutes Intravenous Every 8 hours 08/17/15 1007 08/17/15 1437   08/16/15 2200  ciprofloxacin (CIPRO) IVPB 400 mg  Status:  Discontinued     400 mg 200 mL/hr over 60 Minutes Intravenous Every 24 hours 08/15/15 2329 08/16/15 0825   08/16/15 1800  ciprofloxacin (CIPRO) IVPB 400 mg  Status:  Discontinued     400 mg 200 mL/hr over 60 Minutes Intravenous Every 24 hours 08/16/15 1300 08/16/15 1605   08/16/15 1300  metroNIDAZOLE (FLAGYL) IVPB 500 mg  Status:  Discontinued     500 mg 100 mL/hr over 60 Minutes Intravenous Every 8 hours 08/16/15 1221 08/16/15 1605   08/16/15 0800  piperacillin-tazobactam (ZOSYN) IVPB 2.25 g  Status:  Discontinued     2.25 g 100 mL/hr over 30 Minutes Intravenous Every 8 hours 08/16/15 0645 08/16/15 1220   08/16/15 0700  vancomycin (VANCOCIN) 2,500 mg in sodium chloride 0.9 % 500 mL IVPB     2,500 mg 250 mL/hr over 120 Minutes Intravenous  Once 08/16/15 0645 08/16/15 0941   08/16/15 0600  metroNIDAZOLE (FLAGYL) IVPB 500 mg  Status:  Discontinued     500 mg 100 mL/hr over 60 Minutes Intravenous Every 8 hours 08/15/15 2310 08/16/15 0636   08/15/15 2130  ciprofloxacin (CIPRO) IVPB 400 mg     400 mg 200 mL/hr over 60 Minutes Intravenous  Once 08/15/15 2123 08/15/15 2317   08/15/15 2130  metroNIDAZOLE (FLAGYL) IVPB 500 mg     500 mg 100 mL/hr over 60 Minutes Intravenous  Once 08/15/15 2123 08/15/15 2317          Objective:   Filed Vitals:   08/20/15 0100 08/20/15 0152 08/20/15 0500 08/20/15 1305  BP: 92/58 95/63 91/61  88/50  Pulse: 80 103 91 103  Temp:  97.7 F (36.5 C) 98.2 F (36.8 C)   TempSrc:      Resp:  18 19   Height:      Weight:      SpO2:  100% 93%      Wt Readings from Last 3 Encounters:  08/19/15 110.9 kg (244 lb 7.8 oz)  09/23/14 112 kg (246 lb 14.6 oz)  09/22/14 113.5 kg (250 lb 3.6 oz)     Intake/Output Summary (Last 24 hours) at 08/20/15 1525 Last data filed at 08/20/15 0858  Gross per 24 hour  Intake    960 ml  Output      0 ml  Net    960 ml     Physical Exam  Awake Alert, Oriented X 3, No new F.N  deficits, Normal affect [place].AT,PERRAL Supple Neck,No JVD, No cervical lymphadenopathy appriciated.  Symmetrical Chest wall movement, Good air movement bilaterally, CTAB RRR,No Gallops,Rubs or new Murmurs, No Parasternal Heave +ve B.Sounds, Abd Soft, minimal tenderness, No organomegaly appriciated. No Cyanosis, Clubbing or edema, No new Rash or bruise  ,left fem TDC   Data Review   Micro Results Recent Results (from the past 240 hour(s))  C difficile quick scan w PCR reflex     Status: None   Collection Time: 08/15/15 11:14 PM  Result Value Ref Range Status   C Diff antigen NEGATIVE NEGATIVE Final   C Diff toxin NEGATIVE NEGATIVE Final   C Diff interpretation Negative for toxigenic C. difficile  Final    Radiology Reports Ct Abdomen Pelvis Wo Contrast  08/15/2015   CLINICAL DATA:  Generalized abdominal pain, chronic but worsening over the past several days  EXAM: CT ABDOMEN AND PELVIS WITHOUT CONTRAST  TECHNIQUE: Multidetector CT imaging of the abdomen and pelvis was performed following the standard protocol without IV contrast.  COMPARISON:  09/30/2014  FINDINGS: Lower chest: Left greater than right patchy dependent bibasilar consolidation is noted. Trace pleural effusions. Early pneumonia could appear similar to atelectasis.  Hepatobiliary: Unenhanced liver and gallbladder appear normal.  Pancreas: Normal  Spleen: Normal  Adrenals/Urinary Tract: Bilateral renal atrophy with low-density renal cortical cysts reidentified. No hydroureteronephrosis. No radiopaque renal or ureteral calculus. The bladder is decompressed.   Stomach/Bowel: There is short segmental terminal ileal wall thickening with mild surrounding stranding and abnormal hypodense thickening of this cecum wall. No pericolonic fluid collection to suggest abscess.  Vascular/Lymphatic: Vascular collaterals are present within the anterior abdominal wall. No aortic aneurysm. No lymphadenopathy. Lower extremity vascular bypass is partly visualized.  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 Uterus and ovaries appear unremarkable.  Musculoskeletal: Bilateral L5 pars interarticularis defects. No acute osseous abnormality.  IMPRESSION: Interval increase in terminal ileal wall thickening with contiguous abnormal cecal mural low density most likely indicating inflammatory bowel disease such as Crohn's disease. Colitis/enteritis could appear similar. The appearance is much less typical for ischemia. Lymphoma could appear similar given the presumed previous history of immunosuppression but is less likely.  Probable dependent bibasilar atelectasis. Early pneumonia could appear similar.   Electronically Signed   By: Conchita Paris M.D.   On: 08/15/2015 21:18   Ir Fluoro Guide Cv Line Left  08/19/2015   INDICATION: Failed attempted declot of right upper arm hero graft, in need of intravenous access for continuation of dialysis. Most recent history of left common femoral vein approach dialysis catheter placement.  EXAM: TUNNELED CENTRAL VENOUS HEMODIALYSIS CATHETER PLACEMENT WITH ULTRASOUND AND FLUOROSCOPIC GUIDANCE  COMPARISON:  Attempted right upper arm hero graft declot- 08/18/2015; left femoral vein approach dialysis catheter placement - 09/21/2014.  MEDICATIONS: Ancef 2 gm IV; The IV antibiotic was given in an appropriate time interval prior to skin puncture.  CONTRAST:  None  ANESTHESIA/SEDATION: Versed 2 mg IV; Fentanyl 100 mcg IV  Total Moderate Sedation Time  25 minutes.  FLUOROSCOPY TIME:  1 minute 18 seconds (54.9 mGy)  COMPLICATIONS: None immediate   PROCEDURE: Informed written consent was obtained from the patient after a discussion of the risks, benefits, and alternatives to treatment. Questions regarding the procedure were encouraged and answered. The left groin was prepped with chlorhexidine in a sterile fashion, and a sterile drape was applied covering the operative field. Maximum barrier sterile technique with sterile gowns and gloves were used for the procedure. A timeout was  performed prior to the initiation of the procedure.  After creating a small venotomy incision, a micropuncture kit was utilized to access the left common femoral vein vein under direct, real-time ultrasound guidance after the overlying soft tissues were anesthetized with 1% lidocaine with epinephrine. Ultrasound image documentation was performed. The microwire was kinked to measure appropriate catheter length. A stiff Glidewire was advanced to the level of the IVC and the micropuncture sheath was exchanged for a peel-away sheath. A AshSplit tunneled hemodialysis catheter measuring 55 cm from tip to cuff was tunneled in a retrograde fashion from the left lateral thigh to the venotomy incision.  The catheter was then placed through the peel-away sheath with tips ultimately positioned within the inferior aspect of the right atrium. Final catheter positioning was confirmed and documented with a spot radiographic image. The catheter aspirates and flushes normally. The catheter was flushed with appropriate volume heparin dwells.  The catheter exit site was secured with a 0-Prolene retention suture. The venotomy incision was closed with an interrupted 4-0 Vicryl, Dermabond and Steri-strips. Dressings were applied. The patient tolerated the procedure well without immediate post procedural complication.  IMPRESSION: Successful placement of 55 cm tip to cuff tunneled hemodialysis catheter via the left common femoral vein vein with tips terminating within the inferior aspect of the right  atrium. The catheter is ready for immediate use.   Electronically Signed   By: Sandi Mariscal M.D.   On: 08/19/2015 15:59   Ir Angio Av Shunt Addl Access  08/19/2015   CLINICAL DATA:  49 year old female with a long history of end-stage renal disease on hemodialysis. She currently has dialysis via a right upper extremity HERO graft. The graft is currently clotted. Per the patient, she had issues with the graft clotting earlier this month and [facility] by Dr. Augustin Coupe.  EXAM: IR RIGHT DECLOT; IR ULTRASOUND GUIDANCE VASC ACCESS RIGHT; ARTERIOVENOUS SHUNT FOR DIALYSIS; ADDITIONAL ACCESS  Date: 08/19/2015  PROCEDURE: 1. Ultrasound-guided antegrade puncture of arteriovenous graft 2. Ultrasound-guided retrograde puncture of arteriovenous graft 3. Pharmacomechanical thrombolysis/thrombectomy procedure 4. Shuntogram Interventional Radiologist:  Criselda Peaches, MD  ANESTHESIA/SEDATION: Moderate (conscious) sedation was used. 1.5 mg Versed, 75 mcg Fentanyl were administered intravenously. The patient's vital signs were monitored continuously by radiology nursing throughout the procedure.  Sedation Time: 35 minutes  MEDICATIONS: None additional  FLUOROSCOPY TIME:  7 minutes 54 seconds  185.8 mGy  CONTRAST:  45m OMNIPAQUE IOHEXOL 300 MG/ML  SOLN  TECHNIQUE: Informed consent was obtained from the patient following explanation of the procedure, risks, benefits and alternatives. The patient understands, agrees and consents for the procedure. All questions were addressed. A time out was performed.  Maximal barrier sterile technique utilized including caps, mask, sterile gowns, sterile gloves, large sterile drape, hand hygiene, and Betadine skin prep.  The right upper extremity arteriovenous graft was interrogated with ultrasound. The graft is completely thrombosed. A suitable antegrade puncture site was identified. Local anesthesia was attained by infiltration with 1% lidocaine. Under real-time  ultrasound guidance, the graft was punctured with a 21 gauge micropuncture needle. An image was obtained and stored for the medical record. A transitional 5 French micro sheath was advanced over a micro wire.  A suitable retrograde puncture site was next identified. Again, local anesthesia was attained by infiltration with 1% lidocaine. Under real-time sonographic guidance, the graft was punctured with a 21 gauge micropuncture needle. Again, a transitional 5 [place] sheath was advanced over a micro wire. A total  of 4 mg tPA was then injected into the thrombus through the 2 sheaths. This sheath were then exchanged over a short Amplatz wire for working 6 [place] vascular sheath. A 5 [place] Kumpe the catheter was navigated through the retrograde sheath and into the arterial anastomosis. The artery is widely patent. This also identified the site of the arterial anastomosis. A tear full a device was then advanced from antegrade access into the distal portion of the catheter. Mechanical thrombectomy was then performed throughout the length of the catheter and the venous limb of the graft. The catheter was then inserted through the retrograde sheath and carefully placed through the arterial anastomosis. The thrombectomy device was then used in basket fashion to pull the arterial plug back into the arterial limb of the graft. This was then macerated with the device. Aspiration was performed through the two 6 French sheaths. Abundant clot was aspirated. Free bleed back through the sheath confirmed restoration of flow. The Kumpe the catheter was then advanced into the arterial anastomosis and a shuntogram was performed. The arterial anastomosis is widely patent. There is no residual thrombus. Flow is brisk.  The sheaths were removed and hemostasis attained with the assistance of 2-0 nylon pursestring sutures. The patient tolerated the procedure well.  COMPLICATIONS: None  Estimated blood loss: Approximately 100 mL   IMPRESSION: 1. Completely thrombosed hero graft. 2. Successful pharmaco mechanical thrombolysis/thrombectomy procedure. There is no evidence of inciting stenosis or other abnormality to explain thrombosis of this access. 3. Flow is restored and brisk.  May resume hemodialysis as needed.  Signed,  Criselda Peaches, MD  [facility]  [facility]   Electronically Signed   By: Jacqulynn Cadet M.D.   On: 08/19/2015 09:13   Ir US Guide Vasc Access Left  08/19/2015   INDICATION: Failed attempted declot of right upper arm hero graft, in need of intravenous access for continuation of dialysis. Most recent history of left common femoral vein approach dialysis catheter placement.  EXAM: TUNNELED CENTRAL VENOUS HEMODIALYSIS CATHETER PLACEMENT WITH ULTRASOUND AND FLUOROSCOPIC GUIDANCE  COMPARISON:  Attempted right upper arm hero graft declot- 08/18/2015; left femoral vein approach dialysis catheter placement - 09/21/2014.  MEDICATIONS: Ancef 2 gm IV; The IV antibiotic was given in an appropriate time interval prior to skin puncture.  CONTRAST:  None  ANESTHESIA/SEDATION: Versed 2 mg IV; Fentanyl 100 mcg IV  Total Moderate Sedation Time  25 minutes.  FLUOROSCOPY TIME:  1 minute 18 seconds (02.6 mGy)  COMPLICATIONS: None immediate  PROCEDURE: Informed written consent was obtained from the patient after a discussion of the risks, benefits, and alternatives to treatment. Questions regarding the procedure were encouraged and answered. The left groin was prepped with chlorhexidine in a sterile fashion, and a sterile drape was applied covering the operative field. Maximum barrier sterile technique with sterile gowns and gloves were used for the procedure. A timeout was performed prior to the initiation of the procedure.  After creating a small venotomy incision, a micropuncture kit was utilized to access the left common femoral vein vein under direct, real-time ultrasound guidance  after the overlying soft tissues were anesthetized with 1% lidocaine with epinephrine. Ultrasound image documentation was performed. The microwire was kinked to measure appropriate catheter length. A stiff Glidewire was advanced to the level of the IVC and the micropuncture sheath was exchanged for a peel-away sheath. A AshSplit tunneled hemodialysis catheter measuring 55 cm from tip to cuff was tunneled in a retrograde fashion from  the left lateral thigh to the venotomy incision.  The catheter was then placed through the peel-away sheath with tips ultimately positioned within the inferior aspect of the right atrium. Final catheter positioning was confirmed and documented with a spot radiographic image. The catheter aspirates and flushes normally. The catheter was flushed with appropriate volume heparin dwells.  The catheter exit site was secured with a 0-Prolene retention suture. The venotomy incision was closed with an interrupted 4-0 Vicryl, Dermabond and Steri-strips. Dressings were applied. The patient tolerated the procedure well without immediate post procedural complication.  IMPRESSION: Successful placement of 55 cm tip to cuff tunneled hemodialysis catheter via the left common femoral vein vein with tips terminating within the inferior aspect of the right atrium. The catheter is ready for immediate use.   Electronically Signed   By: Sandi Mariscal M.D.   On: 08/19/2015 15:59   Ir US Guide Vasc Access Right  08/19/2015   CLINICAL DATA:  49 year old female with a long history of end-stage renal disease on hemodialysis. She currently has dialysis via a right upper extremity HERO graft. The graft is currently clotted. Per the patient, she had issues with the graft clotting earlier this month and was [facility] by Dr. Augustin Coupe.  EXAM: IR RIGHT DECLOT; IR ULTRASOUND GUIDANCE VASC ACCESS RIGHT; ARTERIOVENOUS SHUNT FOR DIALYSIS; ADDITIONAL ACCESS  Date: 08/19/2015  PROCEDURE: 1.  Ultrasound-guided antegrade puncture of arteriovenous graft 2. Ultrasound-guided retrograde puncture of arteriovenous graft 3. Pharmacomechanical thrombolysis/thrombectomy procedure 4. Shuntogram Interventional Radiologist:  Criselda Peaches, MD  ANESTHESIA/SEDATION: Moderate (conscious) sedation was used. 1.5 mg Versed, 75 mcg Fentanyl were administered intravenously. The patient's vital signs were monitored continuously by radiology nursing throughout the procedure.  Sedation Time: 35 minutes  MEDICATIONS: None additional  FLUOROSCOPY TIME:  7 minutes 54 seconds  185.8 mGy  CONTRAST:  30m OMNIPAQUE IOHEXOL 300 MG/ML  SOLN  TECHNIQUE: Informed consent was obtained from the patient following explanation of the procedure, risks, benefits and alternatives. The patient understands, agrees and consents for the procedure. All questions were addressed. A time out was performed.  Maximal barrier sterile technique utilized including caps, mask, sterile gowns, sterile gloves, large sterile drape, hand hygiene, and Betadine skin prep.  The right upper extremity arteriovenous graft was interrogated with ultrasound. The graft is completely thrombosed. A suitable antegrade puncture site was identified. Local anesthesia was attained by infiltration with 1% lidocaine. Under real-time ultrasound guidance, the graft was punctured with a 21 gauge micropuncture needle. An image was obtained and stored for the medical record. A transitional 5 French micro sheath was advanced over a micro wire.  A suitable retrograde puncture site was next identified. Again, local anesthesia was attained by infiltration with 1% lidocaine. Under real-time sonographic guidance, the graft was punctured with a 21 gauge micropuncture needle. Again, a transitional 5 [place] sheath was advanced over a micro wire. A total of 4 mg tPA was then injected into the thrombus through the 2 sheaths. This sheath were then exchanged over a short Amplatz wire for  working 6 [place] sheath. A 5 [place] the catheter was navigated through the retrograde sheath and into the arterial anastomosis. The artery is widely patent. This also identified the site of the arterial anastomosis. A tear full a device was then advanced from antegrade access into the distal portion of the catheter. Mechanical thrombectomy was then performed throughout the length of the catheter and the venous limb of the graft. The catheter  was then inserted through the retrograde sheath and carefully placed through the arterial anastomosis. The thrombectomy device was then used in basket fashion to pull the arterial plug back into the arterial limb of the graft. This was then macerated with the device. Aspiration was performed through the two 6 French sheaths. Abundant clot was aspirated. Free bleed back through the sheath confirmed restoration of flow. The Kumpe the catheter was then advanced into the arterial anastomosis and a shuntogram was performed. The arterial anastomosis is widely patent. There is no residual thrombus. Flow is brisk.  The sheaths were removed and hemostasis attained with the assistance of 2-0 nylon pursestring sutures. The patient tolerated the procedure well.  COMPLICATIONS: None  Estimated blood loss: Approximately 100 mL  IMPRESSION: 1. Completely thrombosed hero graft. 2. Successful pharmaco mechanical thrombolysis/thrombectomy procedure. There is no evidence of inciting stenosis or other abnormality to explain thrombosis of this access. 3. Flow is restored and brisk.  May resume hemodialysis as needed.  Signed,  Criselda Peaches, MD  [facility]  [facility]   Electronically Signed   By: Jacqulynn Cadet M.D.   On: 08/19/2015 09:13   Ir Declot Right Mod Sed  08/19/2015   CLINICAL DATA:  49 year old female with a long history of end-stage renal disease on hemodialysis. She currently has dialysis via a right upper  extremity HERO graft. The graft is currently clotted. Per the patient, she had issues with the graft clotting earlier this month and was [facility] by Dr. Augustin Coupe.  EXAM: IR RIGHT DECLOT; IR ULTRASOUND GUIDANCE VASC ACCESS RIGHT; ARTERIOVENOUS SHUNT FOR DIALYSIS; ADDITIONAL ACCESS  Date: 08/19/2015  PROCEDURE: 1. Ultrasound-guided antegrade puncture of arteriovenous graft 2. Ultrasound-guided retrograde puncture of arteriovenous graft 3. Pharmacomechanical thrombolysis/thrombectomy procedure 4. Shuntogram Interventional Radiologist:  Criselda Peaches, MD  ANESTHESIA/SEDATION: Moderate (conscious) sedation was used. 1.5 mg Versed, 75 mcg Fentanyl were administered intravenously. The patient's vital signs were monitored continuously by radiology nursing throughout the procedure.  Sedation Time: 35 minutes  MEDICATIONS: None additional  FLUOROSCOPY TIME:  7 minutes 54 seconds  185.8 mGy  CONTRAST:  73m OMNIPAQUE IOHEXOL 300 MG/ML  SOLN  TECHNIQUE: Informed consent was obtained from the patient following explanation of the procedure, risks, benefits and alternatives. The patient understands, agrees and consents for the procedure. All questions were addressed. A time out was performed.  Maximal barrier sterile technique utilized including caps, mask, sterile gowns, sterile gloves, large sterile drape, hand hygiene, and Betadine skin prep.  The right upper extremity arteriovenous graft was interrogated with ultrasound. The graft is completely thrombosed. A suitable antegrade puncture site was identified. Local anesthesia was attained by infiltration with 1% lidocaine. Under real-time ultrasound guidance, the graft was punctured with a 21 gauge micropuncture needle. An image was obtained and stored for the medical record. A transitional 5 French micro sheath was advanced over a micro wire.  A suitable retrograde puncture site was next identified. Again, local anesthesia was attained by  infiltration with 1% lidocaine. Under real-time sonographic guidance, the graft was punctured with a 21 gauge micropuncture needle. Again, a transitional 5 [place] sheath was advanced over a micro wire. A total of 4 mg tPA was then injected into the thrombus through the 2 sheaths. This sheath were then exchanged over a short Amplatz wire for working 6 [place] sheath. A 5 [place] the catheter was navigated through the retrograde sheath and into the arterial anastomosis. The artery  is widely patent. This also identified the site of the arterial anastomosis. A tear full a device was then advanced from antegrade access into the distal portion of the catheter. Mechanical thrombectomy was then performed throughout the length of the catheter and the venous limb of the graft. The catheter was then inserted through the retrograde sheath and carefully placed through the arterial anastomosis. The thrombectomy device was then used in basket fashion to pull the arterial plug back into the arterial limb of the graft. This was then macerated with the device. Aspiration was performed through the two 6 French sheaths. Abundant clot was aspirated. Free bleed back through the sheath confirmed restoration of flow. The Kumpe the catheter was then advanced into the arterial anastomosis and a shuntogram was performed. The arterial anastomosis is widely patent. There is no residual thrombus. Flow is brisk.  The sheaths were removed and hemostasis attained with the assistance of 2-0 nylon pursestring sutures. The patient tolerated the procedure well.  COMPLICATIONS: None  Estimated blood loss: Approximately 100 mL  IMPRESSION: 1. Completely thrombosed hero graft. 2. Successful pharmaco mechanical thrombolysis/thrombectomy procedure. There is no evidence of inciting stenosis or other abnormality to explain thrombosis of this access. 3. Flow is restored and brisk.  May resume hemodialysis as needed.  Signed,  Criselda Peaches, MD  [facility]  [facility]   Electronically Signed   By: Jacqulynn Cadet M.D.   On: 08/19/2015 09:13     CBC  Recent Labs Lab 08/15/15 1609 08/16/15 0241 08/16/15 1500 08/18/15 0518  WBC 10.3 10.2 9.0 7.3  HGB 12.3 10.7* 10.3* 10.2*  HCT 37.7 32.9* 31.1* 32.2*  PLT 185 172 210 252  MCV 98.2 96.8 95.1 97.9  MCH 32.0 31.5 31.5 31.0  MCHC 32.6 32.5 33.1 31.7  RDW 16.0* 15.7* 15.4 15.4  LYMPHSABS 1.0 1.2  --   --   MONOABS 0.7 0.8  --   --   EOSABS 0.2 0.3  --   --   BASOSABS 0.0 0.0  --   --     Chemistries   Recent Labs Lab 08/15/15 1609 08/16/15 0241 08/16/15 1500 08/18/15 0518  NA 134* 130* 132* 138  K 4.1 4.2 4.6 4.0  CL 93* 91* 95* 97*  CO2 22 20* 20* 22  GLUCOSE 86 88 83 88  BUN 68* 73* 80* 40*  CREATININE 16.24* 16.75* 17.49* 15.20*  CALCIUM 8.1* 7.7* 7.7* 8.2*  AST 24 16  --   --   ALT 17 16  --   --   ALKPHOS 106 94  --   --   BILITOT 1.2 1.0  --   --    ------------------------------------------------------------------------------------------------------------------ estimated creatinine clearance is 5.6 mL/min (by C-G formula based on Cr of 15.2). ------------------------------------------------------------------------------------------------------------------ No results for input(s): HGBA1C in the last 72 hours. ------------------------------------------------------------------------------------------------------------------ No results for input(s): CHOL, HDL, LDLCALC, TRIG, CHOLHDL, LDLDIRECT in the last 72 hours. ------------------------------------------------------------------------------------------------------------------ No results for input(s): TSH, T4TOTAL, T3FREE, THYROIDAB in the last 72 hours.  Invalid input(s): FREET3 ------------------------------------------------------------------------------------------------------------------ No results for input(s): VITAMINB12, FOLATE,  FERRITIN, TIBC, IRON, RETICCTPCT in the last 72 hours.  Coagulation profile  Recent Labs Lab 08/17/15 1120  INR 1.45    No results for input(s): DDIMER in the last 72 hours.  Cardiac Enzymes No results for input(s): CKMB, TROPONINI, MYOGLOBIN in the last 168 hours.  Invalid input(s): CK ------------------------------------------------------------------------------------------------------------------ Invalid input(s): POCBNP     Time Spent in minutes   30  minutes    Christpher Stogsdill M.D on 08/20/2015 at 3:25 PM  Between 7am to 7pm - Pager - [phone]  After 7pm go to [email or web address] - password [facility]  [facility]   Office  [phone]

## 2015-08-21 LAB — RENAL FUNCTION PANEL
Albumin: 3.3 g/dL — ABNORMAL LOW (ref 3.5–5.0)
Anion gap: 15 (ref 5–15)
BUN: 33 mg/dL — ABNORMAL HIGH (ref 6–20)
CO2: 25 mmol/L (ref 22–32)
Calcium: 8.7 mg/dL — ABNORMAL LOW (ref 8.9–10.3)
Chloride: 93 mmol/L — ABNORMAL LOW (ref 101–111)
Creatinine, Ser: 13.4 mg/dL — ABNORMAL HIGH (ref 0.44–1.00)
GFR calc Af Amer: 3 mL/min — ABNORMAL LOW (ref >60)
GFR calc non Af Amer: 3 mL/min — ABNORMAL LOW (ref >60)
Glucose, Bld: 96 mg/dL (ref 65–99)
Phosphorus: 6.8 mg/dL — ABNORMAL HIGH (ref 2.5–4.6)
Potassium: 3.9 mmol/L (ref 3.5–5.1)
Sodium: 133 mmol/L — ABNORMAL LOW (ref 135–145)

## 2015-08-21 LAB — CBC
HCT: 32.6 % — ABNORMAL LOW (ref 36.0–46.0)
Hemoglobin: 10.4 g/dL — ABNORMAL LOW (ref 12.0–15.0)
MCH: 31 pg (ref 26.0–34.0)
MCHC: 31.9 g/dL (ref 30.0–36.0)
MCV: 97.3 fL (ref 78.0–100.0)
Platelets: 237 10*3/uL (ref 150–400)
RBC: 3.35 MIL/uL — ABNORMAL LOW (ref 3.87–5.11)
RDW: 15.2 % (ref 11.5–15.5)
WBC: 6.8 10*3/uL (ref 4.0–10.5)

## 2015-08-21 LAB — GLUCOSE, CAPILLARY: GLUCOSE-CAPILLARY: 121 mg/dL — AB (ref 65–99)

## 2015-08-21 MED ORDER — OXYCODONE-ACETAMINOPHEN 5-325 MG PO TABS: 1.0000 | ORAL_TABLET | Freq: Four times a day (QID) | ORAL | Status: AC | PRN

## 2015-08-21 NOTE — Discharge Instructions (Signed)
Follow with Primary MD Kandice Hams, MD in 7 days   Get CBC, CMP, 2 view Chest X ray checked  by Primary MD next visit.    Activity: As tolerated with Full fall precautions use walker/cane & assistance as needed   Disposition Home    Diet: Heart Healthy ,renal diet , with feeding assistance and aspiration precautions.  For Heart failure patients - Check your Weight same time everyday, if you gain over 2 pounds, or you develop in leg swelling, experience more shortness of breath or chest pain, call your Primary MD immediately. Follow Cardiac Low Salt Diet and 1.5 lit/day fluid restriction.   On your next visit with your primary care physician please Get Medicines reviewed and adjusted.   Please request your Prim.MD to go over all Hospital Tests and Procedure/Radiological results at the follow up, please get all Hospital records sent to your Prim MD by signing hospital release before you go home.   If you experience worsening of your admission symptoms, develop shortness of breath, life threatening emergency, suicidal or homicidal thoughts you must seek medical attention immediately by calling 911 or calling your MD immediately  if symptoms less severe.  You Must read complete instructions/literature along with all the possible adverse reactions/side effects for all the Medicines you take and that have been prescribed to you. Take any new Medicines after you have completely understood and accpet all the possible adverse reactions/side effects.   Do not drive, operating heavy machinery, perform activities at heights, swimming or participation in water activities or provide baby sitting services if your were admitted for syncope or siezures until you have seen by Primary MD or a Neurologist and advised to do so again.  Do not drive when taking Pain medications.    Do not take more than prescribed Pain, Sleep and Anxiety Medications  Special Instructions: If you have smoked or chewed  Tobacco  in the last 2 yrs please stop smoking, stop any regular Alcohol  and or any Recreational drug use.  Wear Seat belts while driving.   Please note  You were cared for by a hospitalist during your hospital stay. If you have any questions about your discharge medications or the care you received while you were in the hospital after you are discharged, you can call the unit and asked to speak with the hospitalist on call if the hospitalist that took care of you is not available. Once you are discharged, your primary care physician will handle any further medical issues. Please note that NO REFILLS for any discharge medications will be authorized once you are discharged, as it is imperative that you return to your primary care physician (or establish a relationship with a primary care physician if you do not have one) for your aftercare needs so that they can reassess your need for medications and monitor your lab values.

## 2015-08-21 NOTE — Discharge Summary (Signed)
Judy Green, is a 49 y.o. female  DOB 04-Dec-1966  MRN [id number].  Admission date:  08/15/2015  Admitting Physician  Rise Patience, MD  Discharge Date:  08/21/2015   Primary MD  Kandice Hams, MD  Recommendations for primary care physician for things to follow:  -  monitor labs during next visit. -  patient to follow with her primary gastroenterologist Dr. 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    Admission Diagnosis  Colitis [K52.9]   Discharge Diagnosis  Colitis [K52.9]    Principal Problem:   Colitis Active Problems:   ESRD on hemodialysis   Chronic hypotension   Abnormal CT scan, gastrointestinal tract   Anemia in chronic kidney disease   Clotted renal dialysis AV graft   ESRD (end stage renal disease)   Acute ischemic colitis      Past Medical History  Diagnosis Date  . 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 Went back on HD Aug 2007 and remains on HD at [facility] on TTS schedule. Just got back on Tx list as of fall 2015.   Marland Kitchen Rectal bleeding 2007    hemorrhoids/diverticular disease  . Retroperitoneal hematoma 9/07    transfusion  . H/O parathyroidectomy 1999  . UTI (lower urinary tract infection)   . Adrenal insufficiency 2009  . Sleep apnea     does not have cpap at this time  . Oral contraceptive use     patient states has not had menstral cycle in 1 year.  . Hypertension     Dr. Seward Carol [phone]  . Chronic headaches   . H/O syncope   . Breast cancer     RT  . Anemia     hx of  . History of multiple failed HD accesses 09/19/2014    Has had multiple failed HD accesses, including but not limited to LUA AVF, RUA AVG, two LUA HeRO accesses, L thigh AVG, R thigh AVG and two RUA HeRO accesses.  As of fall 2015 her 2nd RUA HeRO  occluded and attempt to salvage is pending. Access work is mainly done in [place] with [facility] also involved.    . Chronic hypotension 07/22/2012    Past Surgical History  Procedure Laterality Date  . Av fistula placement Right 05/08/2007  . Hero avg Left 12/14/2008  . Hero  04/17/2009    Removed clotted Left Hero  2) New Right Hero placed and Stent to SVC  . Mastectomy Left   . Exploration renal s/p transplantation    . Tubal ligation    . Avgg removal  08/07/2012    Procedure: REMOVAL OF ARTERIOVENOUS GORETEX GRAFT ([place]);  Surgeon: Rosetta Posner, MD;  Location: [place];  Service: Vascular;  Laterality: Right;  . Right lower extremity vascular bypass    . Colonoscopy N/A 08/18/2015    Procedure: COLONOSCOPY;  Surgeon: Norberto Sorenson  Sindy Guadeloupe, MD;  Location: [facility];  Service: Endoscopy;  Laterality: N/A;       History of present illness and  Hospital Course:     Kindly see H&P for history of present illness and admission details, please review complete Labs, Consult reports and Test reports for all details in brief  HPI  from the history and physical done on the day of admission Judy Green is a 49 y.o. female with history of ESRD, failed transplant, chronic hypotension presents to the ER because of abdominal pain. Patient had recently traveled to [facility] on the weekend on Saturday after dialysis and since Sunday patient has been having lower abdominal pain which was cramping in nature with diarrhea. Patient's pain became more diffuse and severe. Patient denies any nausea vomiting. Denies any sick contacts or any recent use of antibiotics. Patient states her stools have become darker also. In the ER CT scan of the abdomen and pelvis done shows features concerning for endometrial changes in the terminal ileum and cecum. There was also concerning features for possible pneumonia also but patient denies any cough or phlegm. Patient has been admitted for colitis. Patient still has  significant pain and has received fentanyl for pain. Patient has had colonoscopy in March 2015 which showed adenomatous polyps.   Hospital Course   Abdominal pain and diarrhea - CT abdomen and pelvis showing colitis involving cecum and terminal ileal area. - GI consult appreciated, colonoscopy 08/18/15, finding significant for ulcerated cecal mass, biopsies Significant for ischemic finding, no evidence of malignancy , CEA within normal limits, discussed with GI and surgery , at this point will hold on right colectomy pending further evaluation as an outpatient with repeat colonoscopy., even though colonoscopy findings suggestive of obstructive mass, patient did not have any nausea or vomiting , no symptoms of obstruction , has been tolerating soft diet with good bowel movement , (can be followed as an outpatient with plan to repeat colonoscopy in a few weeks to reassess the inflammatory mass by her primary gastroesophageal Dr. Edison Nasuti ,will monitor her course clinically . - C. difficile negative  Questionable pneumonia on imaging - CT abdomen showing bibasilar infiltrate/atelectasis, no clinical evidence of pneumonia, IV vancomycin and Zosyn stopped .  Chronic hypotension - Continue with fludrocortisone and midodrine  End-stage renal disease - Nephrology consulted - On TTS schedule, status post thrombolysis/thrombectomy of her HERO graft by IR 9/2, unfortunately still nonfunctional, temporary hemodialysis  femoral accessby IR on 08/19/2015.  Clotted right upper HERO  - clotted 8/22 and 9/2 with reclot after 9/2 declot - left TDC placed femoral 9/3; needs to follow up with primary physician who placed HERO to see if it is in any way salvageble.- wants to be referred back to [facility] to plan perm access  History of failed renal transplant  Anemia - related to renal disease      Discharge Condition:  Stable   Follow UP  Follow-up Information    Follow up with POLITE,RONALD D, MD. Call  in 1 week.   Specialty:  Internal Medicine   Why:  Posthospitalization follow-up   Contact information:   [address]  [place] [address] [phone]       Follow up with Milus Banister, MD. Call in 2 weeks.   Specialty:  Gastroenterology   Why:  Regarding repeat colonoscopy to follow on cecal mass   Contact information:   [address] [place] [address] [phone]  Discharge Instructions  and  Discharge Medications     Discharge Instructions    Discharge instructions    Complete by:  As directed   Follow with Primary MD Kandice Hams, MD in 7 days   Get CBC, CMP, 2 view Chest X ray checked  by Primary MD next visit.    Activity: As tolerated with Full fall precautions use walker/cane & assistance as needed   Disposition Home    Diet: Heart Healthy ,renal diet , with feeding assistance and aspiration precautions.  For Heart failure patients - Check your Weight same time everyday, if you gain over 2 pounds, or you develop in leg swelling, experience more shortness of breath or chest pain, call your Primary MD immediately. Follow Cardiac Low Salt Diet and 1.5 lit/day fluid restriction.   On your next visit with your primary care physician please Get Medicines reviewed and adjusted.   Please request your Prim.MD to go over all Hospital Tests and Procedure/Radiological results at the follow up, please get all Hospital records sent to your Prim MD by signing hospital release before you go home.   If you experience worsening of your admission symptoms, develop shortness of breath, life threatening emergency, suicidal or homicidal thoughts you must seek medical attention immediately by calling 911 or calling your MD immediately  if symptoms less severe.  You Must read complete instructions/literature along with all the possible adverse reactions/side effects for all the Medicines you take and that have been prescribed to you. Take  any new Medicines after you have completely understood and accpet all the possible adverse reactions/side effects.   Do not drive, operating heavy machinery, perform activities at heights, swimming or participation in water activities or provide baby sitting services if your were admitted for syncope or siezures until you have seen by Primary MD or a Neurologist and advised to do so again.  Do not drive when taking Pain medications.    Do not take more than prescribed Pain, Sleep and Anxiety Medications  Special Instructions: If you have smoked or chewed Tobacco  in the last 2 yrs please stop smoking, stop any regular Alcohol  and or any Recreational drug use.  Wear Seat belts while driving.   Please note  You were cared for by a hospitalist during your hospital stay. If you have any questions about your discharge medications or the care you received while you were in the hospital after you are discharged, you can call the unit and asked to speak with the hospitalist on call if the hospitalist that took care of you is not available. Once you are discharged, your primary care physician will handle any further medical issues. Please note that NO REFILLS for any discharge medications will be authorized once you are discharged, as it is imperative that you return to your primary care physician (or establish a relationship with a primary care physician if you do not have one) for your aftercare needs so that they can reassess your need for medications and monitor your lab values.     Increase activity slowly    Complete by:  As directed             Medication List    STOP taking these medications        clopidogrel 75 MG tablet  Commonly known as:  PLAVIX      TAKE these medications        aspirin EC 325 MG tablet  Take 325 mg by mouth  daily.     Calcium Acetate 667 MG Tabs  Take 1,334-2,001 mg by mouth 3 (three) times daily with meals. Take 2001 mg by mouth 3 times daily with meals.  Take 1334 mg by mouth twice daily with snacks or deserts.     cyclobenzaprine 5 MG tablet  Commonly known as:  FLEXERIL  Take 1 tablet (5 mg total) by mouth 3 (three) times daily as needed for muscle spasms.     fludrocortisone 0.1 MG tablet  Commonly known as:  FLORINEF  Take 2 tablets (0.2 mg total) by mouth daily.     gabapentin 100 MG capsule  Commonly known as:  NEURONTIN  Take 100 mg by mouth at bedtime.     midodrine 10 MG tablet  Commonly known as:  PROAMATINE  Take 1 tablet (10 mg total) by mouth 3 (three) times daily.     norethindrone 0.35 MG tablet  Commonly known as:  MICRONOR,CAMILA,ERRIN  Take 1 tablet (0.35 mg total) by mouth daily.     oxyCODONE-acetaminophen 5-325 MG per tablet  Commonly known as:  PERCOCET/ROXICET  Take 1 tablet by mouth every 6 (six) hours as needed for severe pain.     SENSIPAR 30 MG tablet  Generic drug:  cinacalcet  Take 30 mg by mouth daily.          Diet and Activity recommendation: See Discharge Instructions above   Consults obtained -  Renal GI Surgery   Major procedures and Radiology Reports - PLEASE review detailed and final reports for all details, in brief -  - Colonoscopy 08/18/59 1. 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 Ulcerations in the ascending colon; multiple biopsies performed 3. Mild diverticulosis noted in the sigmoid colon 4. Grade l internal hemorrhoids - Thrombolysis of right upper extremity hero graft 9/2 by IR - Tunneled hemodialysis catheter IR 9/3   Ct Abdomen Pelvis Wo Contrast  08/15/2015   CLINICAL DATA:  Generalized abdominal pain, chronic but worsening over the past several days  EXAM: CT ABDOMEN AND PELVIS WITHOUT CONTRAST  TECHNIQUE: Multidetector CT imaging of the abdomen and pelvis was performed following the standard protocol without IV contrast.  COMPARISON:  09/30/2014  FINDINGS: Lower chest: Left greater than right patchy dependent bibasilar  consolidation is noted. Trace pleural effusions. Early pneumonia could appear similar to atelectasis.  Hepatobiliary: Unenhanced liver and gallbladder appear normal.  Pancreas: Normal  Spleen: Normal  Adrenals/Urinary Tract: Bilateral renal atrophy with low-density renal cortical cysts reidentified. No hydroureteronephrosis. No radiopaque renal or ureteral calculus. The bladder is decompressed.  Stomach/Bowel: There is short segmental terminal ileal wall thickening with mild surrounding stranding and abnormal hypodense thickening of this cecum wall. No pericolonic fluid collection to suggest abscess.  Vascular/Lymphatic: Vascular collaterals are present within the anterior abdominal wall. No aortic aneurysm. No lymphadenopathy. Lower extremity vascular bypass is partly visualized.  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 Uterus and ovaries appear unremarkable.  Musculoskeletal: Bilateral L5 pars interarticularis defects. No acute osseous abnormality.  IMPRESSION: Interval increase in terminal ileal wall thickening with contiguous abnormal cecal mural low density most likely indicating inflammatory bowel disease such as Crohn's disease. Colitis/enteritis could appear similar. The appearance is much less typical for ischemia. Lymphoma could appear similar given the presumed previous history of immunosuppression but is less likely.  Probable dependent bibasilar atelectasis. Early pneumonia could appear similar.   Electronically Signed   By: Conchita Paris M.D.   On: 08/15/2015 21:18  Ir Fluoro Guide Cv Line Left  08/19/2015   INDICATION: Failed attempted declot of right upper arm hero graft, in need of intravenous access for continuation of dialysis. Most recent history of left common femoral vein approach dialysis catheter placement.  EXAM: TUNNELED CENTRAL VENOUS HEMODIALYSIS CATHETER PLACEMENT WITH ULTRASOUND AND FLUOROSCOPIC GUIDANCE  COMPARISON:  Attempted right upper arm hero graft  declot- 08/18/2015; left femoral vein approach dialysis catheter placement - 09/21/2014.  MEDICATIONS: Ancef 2 gm IV; The IV antibiotic was given in an appropriate time interval prior to skin puncture.  CONTRAST:  None  ANESTHESIA/SEDATION: Versed 2 mg IV; Fentanyl 100 mcg IV  Total Moderate Sedation Time  25 minutes.  FLUOROSCOPY TIME:  1 minute 18 seconds (54.0 mGy)  COMPLICATIONS: None immediate  PROCEDURE: Informed written consent was obtained from the patient after a discussion of the risks, benefits, and alternatives to treatment. Questions regarding the procedure were encouraged and answered. The left groin was prepped with chlorhexidine in a sterile fashion, and a sterile drape was applied covering the operative field. Maximum barrier sterile technique with sterile gowns and gloves were used for the procedure. A timeout was performed prior to the initiation of the procedure.  After creating a small venotomy incision, a micropuncture kit was utilized to access the left common femoral vein vein under direct, real-time ultrasound guidance after the overlying soft tissues were anesthetized with 1% lidocaine with epinephrine. Ultrasound image documentation was performed. The microwire was kinked to measure appropriate catheter length. A stiff Glidewire was advanced to the level of the IVC and the micropuncture sheath was exchanged for a peel-away sheath. A AshSplit tunneled hemodialysis catheter measuring 55 cm from tip to cuff was tunneled in a retrograde fashion from the left lateral thigh to the venotomy incision.  The catheter was then placed through the peel-away sheath with tips ultimately positioned within the inferior aspect of the right atrium. Final catheter positioning was confirmed and documented with a spot radiographic image. The catheter aspirates and flushes normally. The catheter was flushed with appropriate volume heparin dwells.  The catheter exit site was secured with a 0-Prolene retention  suture. The venotomy incision was closed with an interrupted 4-0 Vicryl, Dermabond and Steri-strips. Dressings were applied. The patient tolerated the procedure well without immediate post procedural complication.  IMPRESSION: Successful placement of 55 cm tip to cuff tunneled hemodialysis catheter via the left common femoral vein vein with tips terminating within the inferior aspect of the right atrium. The catheter is ready for immediate use.   Electronically Signed   By: Sandi Mariscal M.D.   On: 08/19/2015 15:59   Ir Angio Av Shunt Addl Access  08/19/2015   CLINICAL DATA:  49 year old female with a long history of end-stage renal disease on hemodialysis. She currently has dialysis via a right upper extremity HERO graft. The graft is currently clotted. Per the patient, she had issues with the graft clotting earlier this month and [facility] by Dr. Augustin Coupe.  EXAM: IR RIGHT DECLOT; IR ULTRASOUND GUIDANCE VASC ACCESS RIGHT; ARTERIOVENOUS SHUNT FOR DIALYSIS; ADDITIONAL ACCESS  Date: 08/19/2015  PROCEDURE: 1. Ultrasound-guided antegrade puncture of arteriovenous graft 2. Ultrasound-guided retrograde puncture of arteriovenous graft 3. Pharmacomechanical thrombolysis/thrombectomy procedure 4. Shuntogram Interventional Radiologist:  Criselda Peaches, MD  ANESTHESIA/SEDATION: Moderate (conscious) sedation was used. 1.5 mg Versed, 75 mcg Fentanyl were administered intravenously. The patient's vital signs were monitored continuously by radiology nursing throughout the procedure.  Sedation Time: 35 minutes  MEDICATIONS:  None additional  FLUOROSCOPY TIME:  7 minutes 54 seconds  185.8 mGy  CONTRAST:  37m OMNIPAQUE IOHEXOL 300 MG/ML  SOLN  TECHNIQUE: Informed consent was obtained from the patient following explanation of the procedure, risks, benefits and alternatives. The patient understands, agrees and consents for the procedure. All questions were addressed. A time out was performed.   Maximal barrier sterile technique utilized including caps, mask, sterile gowns, sterile gloves, large sterile drape, hand hygiene, and Betadine skin prep.  The right upper extremity arteriovenous graft was interrogated with ultrasound. The graft is completely thrombosed. A suitable antegrade puncture site was identified. Local anesthesia was attained by infiltration with 1% lidocaine. Under real-time ultrasound guidance, the graft was punctured with a 21 gauge micropuncture needle. An image was obtained and stored for the medical record. A transitional 5 French micro sheath was advanced over a micro wire.  A suitable retrograde puncture site was next identified. Again, local anesthesia was attained by infiltration with 1% lidocaine. Under real-time sonographic guidance, the graft was punctured with a 21 gauge micropuncture needle. Again, a transitional 5 [place] sheath was advanced over a micro wire. A total of 4 mg tPA was then injected into the thrombus through the 2 sheaths. This sheath were then exchanged over a short Amplatz wire for working 6 [place] sheath. A 5 [place] the catheter was navigated through the retrograde sheath and into the arterial anastomosis. The artery is widely patent. This also identified the site of the arterial anastomosis. A tear full a device was then advanced from antegrade access into the distal portion of the catheter. Mechanical thrombectomy was then performed throughout the length of the catheter and the venous limb of the graft. The catheter was then inserted through the retrograde sheath and carefully placed through the arterial anastomosis. The thrombectomy device was then used in basket fashion to pull the arterial plug back into the arterial limb of the graft. This was then macerated with the device. Aspiration was performed through the two 6 French sheaths. Abundant clot was aspirated. Free bleed back through the sheath confirmed restoration of flow. The  Kumpe the catheter was then advanced into the arterial anastomosis and a shuntogram was performed. The arterial anastomosis is widely patent. There is no residual thrombus. Flow is brisk.  The sheaths were removed and hemostasis attained with the assistance of 2-0 nylon pursestring sutures. The patient tolerated the procedure well.  COMPLICATIONS: None  Estimated blood loss: Approximately 100 mL  IMPRESSION: 1. Completely thrombosed hero graft. 2. Successful pharmaco mechanical thrombolysis/thrombectomy procedure. There is no evidence of inciting stenosis or other abnormality to explain thrombosis of this access. 3. Flow is restored and brisk.  May resume hemodialysis as needed.  Signed,  HCriselda Peaches MD  [facility]  [facility]   Electronically Signed   By: HJacqulynn CadetM.D.   On: 08/19/2015 09:13   Ir [place] Vasc Access Left  08/19/2015   INDICATION: Failed attempted declot of right upper arm hero graft, in need of intravenous access for continuation of dialysis. Most recent history of left common femoral vein approach dialysis catheter placement.  EXAM: TUNNELED CENTRAL VENOUS HEMODIALYSIS CATHETER PLACEMENT WITH ULTRASOUND AND FLUOROSCOPIC GUIDANCE  COMPARISON:  Attempted right upper arm hero graft declot- 08/18/2015; left femoral vein approach dialysis catheter placement - 09/21/2014.  MEDICATIONS: Ancef 2 gm IV; The IV antibiotic was given in an appropriate time interval prior to skin puncture.  CONTRAST:  None  ANESTHESIA/SEDATION: Versed 2 mg IV; Fentanyl 100 mcg IV  Total Moderate Sedation Time  25 minutes.  FLUOROSCOPY TIME:  1 minute 18 seconds (06.2 mGy)  COMPLICATIONS: None immediate  PROCEDURE: Informed written consent was obtained from the patient after a discussion of the risks, benefits, and alternatives to treatment. Questions regarding the procedure were encouraged and answered. The left groin was prepped with chlorhexidine in a  sterile fashion, and a sterile drape was applied covering the operative field. Maximum barrier sterile technique with sterile gowns and gloves were used for the procedure. A timeout was performed prior to the initiation of the procedure.  After creating a small venotomy incision, a micropuncture kit was utilized to access the left common femoral vein vein under direct, real-time ultrasound guidance after the overlying soft tissues were anesthetized with 1% lidocaine with epinephrine. Ultrasound image documentation was performed. The microwire was kinked to measure appropriate catheter length. A stiff Glidewire was advanced to the level of the IVC and the micropuncture sheath was exchanged for a peel-away sheath. A AshSplit tunneled hemodialysis catheter measuring 55 cm from tip to cuff was tunneled in a retrograde fashion from the left lateral thigh to the venotomy incision.  The catheter was then placed through the peel-away sheath with tips ultimately positioned within the inferior aspect of the right atrium. Final catheter positioning was confirmed and documented with a spot radiographic image. The catheter aspirates and flushes normally. The catheter was flushed with appropriate volume heparin dwells.  The catheter exit site was secured with a 0-Prolene retention suture. The venotomy incision was closed with an interrupted 4-0 Vicryl, Dermabond and Steri-strips. Dressings were applied. The patient tolerated the procedure well without immediate post procedural complication.  IMPRESSION: Successful placement of 55 cm tip to cuff tunneled hemodialysis catheter via the left common femoral vein vein with tips terminating within the inferior aspect of the right atrium. The catheter is ready for immediate use.   Electronically Signed   By: Sandi Mariscal M.D.   On: 08/19/2015 15:59   Ir US Guide Vasc Access Right  08/19/2015   CLINICAL DATA:  49 year old female with a long history of end-stage renal disease on  hemodialysis. She currently has dialysis via a right upper extremity HERO graft. The graft is currently clotted. Per the patient, she had issues with the graft clotting earlier this month and was [facility] by Dr. Augustin Coupe.  EXAM: IR RIGHT DECLOT; IR ULTRASOUND GUIDANCE VASC ACCESS RIGHT; ARTERIOVENOUS SHUNT FOR DIALYSIS; ADDITIONAL ACCESS  Date: 08/19/2015  PROCEDURE: 1. Ultrasound-guided antegrade puncture of arteriovenous graft 2. Ultrasound-guided retrograde puncture of arteriovenous graft 3. Pharmacomechanical thrombolysis/thrombectomy procedure 4. Shuntogram Interventional Radiologist:  Criselda Peaches, MD  ANESTHESIA/SEDATION: Moderate (conscious) sedation was used. 1.5 mg Versed, 75 mcg Fentanyl were administered intravenously. The patient's vital signs were monitored continuously by radiology nursing throughout the procedure.  Sedation Time: 35 minutes  MEDICATIONS: None additional  FLUOROSCOPY TIME:  7 minutes 54 seconds  185.8 mGy  CONTRAST:  29m OMNIPAQUE IOHEXOL 300 MG/ML  SOLN  TECHNIQUE: Informed consent was obtained from the patient following explanation of the procedure, risks, benefits and alternatives. The patient understands, agrees and consents for the procedure. All questions were addressed. A time out was performed.  Maximal barrier sterile technique utilized including caps, mask, sterile gowns, sterile gloves, large sterile drape, hand hygiene, and Betadine skin prep.  The right upper extremity arteriovenous graft was interrogated with ultrasound. The graft is completely thrombosed. A suitable  antegrade puncture site was identified. Local anesthesia was attained by infiltration with 1% lidocaine. Under real-time ultrasound guidance, the graft was punctured with a 21 gauge micropuncture needle. An image was obtained and stored for the medical record. A transitional 5 French micro sheath was advanced over a micro wire.  A suitable retrograde puncture site was next  identified. Again, local anesthesia was attained by infiltration with 1% lidocaine. Under real-time sonographic guidance, the graft was punctured with a 21 gauge micropuncture needle. Again, a transitional 5 [place] micro sheath was advanced over a micro wire. A total of 4 mg tPA was then injected into the thrombus through the 2 sheaths. This sheath were then exchanged over a short Amplatz wire for working 6 [place] vascular sheath. A 5 [place] Kumpe the catheter was navigated through the retrograde sheath and into the arterial anastomosis. The artery is widely patent. This also identified the site of the arterial anastomosis. A tear full a device was then advanced from antegrade access into the distal portion of the catheter. Mechanical thrombectomy was then performed throughout the length of the catheter and the venous limb of the graft. The catheter was then inserted through the retrograde sheath and carefully placed through the arterial anastomosis. The thrombectomy device was then used in basket fashion to pull the arterial plug back into the arterial limb of the graft. This was then macerated with the device. Aspiration was performed through the two 6 French sheaths. Abundant clot was aspirated. Free bleed back through the sheath confirmed restoration of flow. The Kumpe the catheter was then advanced into the arterial anastomosis and a shuntogram was performed. The arterial anastomosis is widely patent. There is no residual thrombus. Flow is brisk.  The sheaths were removed and hemostasis attained with the assistance of 2-0 nylon pursestring sutures. The patient tolerated the procedure well.  COMPLICATIONS: None  Estimated blood loss: Approximately 100 mL  IMPRESSION: 1. Completely thrombosed hero graft. 2. Successful pharmaco mechanical thrombolysis/thrombectomy procedure. There is no evidence of inciting stenosis or other abnormality to explain thrombosis of this access. 3. Flow is restored and brisk.  May  resume hemodialysis as needed.  Signed,  Criselda Peaches, MD  [facility]  [facility]iology   Electronically Signed   By: Jacqulynn Cadet M.D.   On: 08/19/2015 09:13   Ir Declot Right Mod Sed  08/19/2015   CLINICAL DATA:  49 year old female with a long history of end-stage renal disease on hemodialysis. She currently has dialysis via a right upper extremity HERO graft. The graft is currently clotted. Per the patient, she had issues with the graft clotting earlier this month and [facility] by Dr. Augustin Coupe.  EXAM: IR RIGHT DECLOT; IR ULTRASOUND GUIDANCE VASC ACCESS RIGHT; ARTERIOVENOUS SHUNT FOR DIALYSIS; ADDITIONAL ACCESS  Date: 08/19/2015  PROCEDURE: 1. Ultrasound-guided antegrade puncture of arteriovenous graft 2. Ultrasound-guided retrograde puncture of arteriovenous graft 3. Pharmacomechanical thrombolysis/thrombectomy procedure 4. Shuntogram Interventional Radiologist:  Criselda Peaches, MD  ANESTHESIA/SEDATION: Moderate (conscious) sedation was used. 1.5 mg Versed, 75 mcg Fentanyl were administered intravenously. The patient's vital signs were monitored continuously by radiology nursing throughout the procedure.  Sedation Time: 35 minutes  MEDICATIONS: None additional  FLUOROSCOPY TIME:  7 minutes 54 seconds  185.8 mGy  CONTRAST:  41m OMNIPAQUE IOHEXOL 300 MG/ML  SOLN  TECHNIQUE: Informed consent was obtained from the patient following explanation of the procedure, risks, benefits and alternatives. The patient understands, agrees and consents for the  procedure. All questions were addressed. A time out was performed.  Maximal barrier sterile technique utilized including caps, mask, sterile gowns, sterile gloves, large sterile drape, hand hygiene, and Betadine skin prep.  The right upper extremity arteriovenous graft was interrogated with ultrasound. The graft is completely thrombosed. A suitable antegrade puncture site was  identified. Local anesthesia was attained by infiltration with 1% lidocaine. Under real-time ultrasound guidance, the graft was punctured with a 21 gauge micropuncture needle. An image was obtained and stored for the medical record. A transitional 5 French micro sheath was advanced over a micro wire.  A suitable retrograde puncture site was next identified. Again, local anesthesia was attained by infiltration with 1% lidocaine. Under real-time sonographic guidance, the graft was punctured with a 21 gauge micropuncture needle. Again, a transitional 5 [place] micro sheath was advanced over a micro wire. A total of 4 mg tPA was then injected into the thrombus through the 2 sheaths. This sheath were then exchanged over a short Amplatz wire for working 6 [place] vascular sheath. A 5 [place] Kumpe the catheter was navigated through the retrograde sheath and into the arterial anastomosis. The artery is widely patent. This also identified the site of the arterial anastomosis. A tear full a device was then advanced from antegrade access into the distal portion of the catheter. Mechanical thrombectomy was then performed throughout the length of the catheter and the venous limb of the graft. The catheter was then inserted through the retrograde sheath and carefully placed through the arterial anastomosis. The thrombectomy device was then used in basket fashion to pull the arterial plug back into the arterial limb of the graft. This was then macerated with the device. Aspiration was performed through the two 6 French sheaths. Abundant clot was aspirated. Free bleed back through the sheath confirmed restoration of flow. The Kumpe the catheter was then advanced into the arterial anastomosis and a shuntogram was performed. The arterial anastomosis is widely patent. There is no residual thrombus. Flow is brisk.  The sheaths were removed and hemostasis attained with the assistance of 2-0 nylon pursestring sutures. The patient tolerated  the procedure well.  COMPLICATIONS: None  Estimated blood loss: Approximately 100 mL  IMPRESSION: 1. Completely thrombosed hero graft. 2. Successful pharmaco mechanical thrombolysis/thrombectomy procedure. There is no evidence of inciting stenosis or other abnormality to explain thrombosis of this access. 3. Flow is restored and brisk.  May resume hemodialysis as needed.  Signed,  Criselda Peaches, MD  [facility]  [facility] Radiology   Electronically Signed   By: Jacqulynn Cadet M.D.   On: 08/19/2015 09:13    Micro Results     Recent Results (from the past 240 hour(s))  C difficile quick scan w PCR reflex     Status: None   Collection Time: 08/15/15 11:14 PM  Result Value Ref Range Status   C Diff antigen NEGATIVE NEGATIVE Final   C Diff toxin NEGATIVE NEGATIVE Final   C Diff interpretation Negative for toxigenic C. difficile  Final       Today   Subjective:   Judy Green today has no headache,no chest  pain,no new weakness tingling or numbness, feels much better wants to go home today. Tolerating soft diet, had good bowel movement this a.m. , denies any nausea or abdominal pain .  Objective:   Blood pressure 94/34, pulse 90, temperature 98.5 F (36.9 C), temperature source Oral, resp. rate 18, height 5' 7"  (1.702 m), weight 108.9 kg (  240 lb 1.3 oz), SpO2 93 %.   Intake/Output Summary (Last 24 hours) at 08/21/15 1000 Last data filed at 08/21/15 0619  Gross per 24 hour  Intake    720 ml  Output    301 ml  Net    419 ml    Exam Awake Alert, Oriented x 3, No new F.N deficits, Normal affect Arthur.AT,PERRAL Supple Neck,No JVD, No cervical lymphadenopathy appriciated.  Symmetrical Chest wall movement, Good air movement bilaterally, CTAB RRR,No Gallops,Rubs or new Murmurs, No Parasternal Heave +ve B.Sounds,  soft , very minimal tenderness  , No organomegaly appriciated, No rebound -guarding or rigidity. No Cyanosis, Clubbing or  edema, No new Rash or bruise  Data Review   CBC w Diff: Lab Results  Component Value Date   WBC 6.8 08/21/2015   HGB 10.4* 08/21/2015   HCT 32.6* 08/21/2015   PLT 237 08/21/2015   LYMPHOPCT 12 08/16/2015   MONOPCT 8 08/16/2015   EOSPCT 3 08/16/2015   BASOPCT 0 08/16/2015    CMP: Lab Results  Component Value Date   NA 133* 08/21/2015   K 3.9 08/21/2015   CL 93* 08/21/2015   CO2 25 08/21/2015   BUN 33* 08/21/2015   CREATININE 13.40* 08/21/2015   PROT 6.5 08/16/2015   ALBUMIN 3.3* 08/21/2015   BILITOT 1.0 08/16/2015   ALKPHOS 94 08/16/2015   AST 16 08/16/2015   ALT 16 08/16/2015  .   Total Time in preparing paper work, data evaluation and todays exam - 35 minutes  ELGERGAWY, DAWOOD M.D on 08/21/2015 at 10:00 AM  [facility]   Office  [phone]

## 2015-08-21 NOTE — Progress Notes (Signed)
Subjective:   abd still a little tender but much improved. Tolerating diet. For DC today  Objective Filed Vitals:   08/20/15 1716 08/20/15 2109 08/21/15 0433 08/21/15 0747  BP: 92/69 90/53 107/35 94/34  Pulse: 109 85 92 90  Temp: 97.6 F (36.4 C) 97.7 F (36.5 C) 98.2 F (36.8 C) 98.5 F (36.9 C)  TempSrc: Oral Oral Oral Oral  Resp: _0 Height:      Weight:  108.9 kg (240 lb 1.3 oz)    SpO2: 95% 91% 95% 93%   Physical Exam General: alert and oriented. No acute distress Heart: RRR Lungs: CTA, unlabored Abdomen: obese, mild tenderness +BS  Extremities: no edema Dialysis Access: clotted AVG.  New L fem cath placed 9/3  D[facility] 450/800 right HERO 2 K 2 Ca profile heparin 3000 with 3000 min tmt, Mircera 75 last 8/16 venofer 100 x 5 start 9/1 calcitriol 0.75 Recent labs: Hgb 11.2 8/25 15% sat ferritin 1133 July 23% sat July iPTH 456 coming down with normal Ca/P  Assessment/Plan: 1. Abdominal pain -colonoscopy showed ulcerated ileocecal valve tissue-less likely infections, no evidence of dysplasia or malignancy not c/w IBD per Dr. Hilarie Fredrickson via path - plan to follow conservatively and rescope her in a month to see if problem has resolved- pain better 2. ESRD - TTS - missed HD Tuesday - had Wed for 3 hour; missed Thursday HD due to clotted access - had declot Friday and then found to be clotted again Friday night so not dialyzed. [place] placed Saturday and had HD afterwards. Next HD planned for Tuesday- oupt-  3. BP/volume - midodrine - for BP support with hx of adrenal insufficiency., ; Keep BP >90 due to #1;  4. Anemia - Hgb 10.4 - continue Fe and ESA- missed dose - rescheduled for Tueday - will notify her HD unit 5. Metabolic bone disease - Ca 8.7 -continue calcitriol and binders 6. Nutrition - Alb 3.3 - tolerating diet 7. Adrenal insufficiency -on fludrocortisone - generally not as effective as florinef,, though she has been on this for quite some  time; 8. Clotted right upper HERO - clotted 8/22 and 9/2 with reclot after 9/2 declot - left TDC placed femoral 9/3; needs to follow up with primary physician who placed HERO to see if it is in any way salvageble.- wants to be referred back to [facility] to plan perm access 9. Disp - plan d/c today  Shelle Iron, NP [place] [phone] 08/21/2015,9:21 AM  LOS: 6 days   Pt seen, examined and agree w A/P as above.  Kelly Splinter MD pager [phone]    cell [phone] 08/21/2015, 10:37 AM     Additional Objective Labs: Basic Metabolic Panel:  Recent Labs Lab 08/16/15 1500 08/18/15 0518 08/21/15 0459  NA 132* 138 133*  K 4.6 4.0 3.9  CL 95* 97* 93*  CO2 20* 22 25  GLUCOSE 83 88 96  BUN 80* 40* 33*  CREATININE 17.49* 15.20* 13.40*  CALCIUM 7.7* 8.2* 8.7*  PHOS 7.6*  --  6.8*   Liver Function Tests:  Recent Labs Lab 08/15/15 1609 08/16/15 0241 08/16/15 1500 08/21/15 0459  AST 24 16  --   --   ALT 17 16  --   --   ALKPHOS 106 94  --   --   BILITOT 1.2 1.0  --   --   PROT 7.2 6.5  --   --   ALBUMIN 3.8  3.3* 3.1* 3.3*    Recent Labs Lab 08/15/15 1609  LIPASE 23   CBC:  Recent Labs Lab 08/15/15 1609 08/16/15 0241 08/16/15 1500 08/18/15 0518 08/21/15 0459  WBC 10.3 10.2 9.0 7.3 6.8  NEUTROABS 8.4* 7.9*  --   --   --   HGB 12.3 10.7* 10.3* 10.2* 10.4*  HCT 37.7 32.9* 31.1* 32.2* 32.6*  MCV 98.2 96.8 95.1 97.9 97.3  PLT 185 172 210 252 237   Blood Culture    Component Value Date/Time   SDES BLOOD RIGHT ARM 05/08/2008 0930   SPECREQUEST BOTTLES DRAWN AEROBIC AND ANAEROBIC 5CC 05/08/2008 0930   CULT NO GROWTH 5 DAYS 05/08/2008 0930   REPTSTATUS 05/14/2008 FINAL 05/08/2008 0930    Cardiac Enzymes: No results for input(s): CKTOTAL, CKMB, CKMBINDEX, TROPONINI in the last 168 hours. CBG:  Recent Labs Lab 08/20/15 0742 08/20/15 1143 08/20/15 1649 08/20/15 2108 08/21/15 0859  GLUCAP 88 100* 142* 123* 121*   Iron Studies: No  results for input(s): IRON, TIBC, TRANSFERRIN, FERRITIN in the last 72 hours. _0 @ Studies/Results: Ir Fluoro Guide Cv Line Left  08/19/2015   INDICATION: Failed attempted declot of right upper arm hero graft, in need of intravenous access for continuation of dialysis. Most recent history of left common femoral vein approach dialysis catheter placement.  EXAM: TUNNELED CENTRAL VENOUS HEMODIALYSIS CATHETER PLACEMENT WITH ULTRASOUND AND FLUOROSCOPIC GUIDANCE  COMPARISON:  Attempted right upper arm hero graft declot- 08/18/2015; left femoral vein approach dialysis catheter placement - 09/21/2014.  MEDICATIONS: Ancef 2 gm IV; The IV antibiotic was given in an appropriate time interval prior to skin puncture.  CONTRAST:  None  ANESTHESIA/SEDATION: Versed 2 mg IV; Fentanyl 100 mcg IV  Total Moderate Sedation Time  25 minutes.  FLUOROSCOPY TIME:  1 minute 18 seconds (46.5 mGy)  COMPLICATIONS: None immediate  PROCEDURE: Informed written consent was obtained from the patient after a discussion of the risks, benefits, and alternatives to treatment. Questions regarding the procedure were encouraged and answered. The left groin was prepped with chlorhexidine in a sterile fashion, and a sterile drape was applied covering the operative field. Maximum barrier sterile technique with sterile gowns and gloves were used for the procedure. A timeout was performed prior to the initiation of the procedure.  After creating a small venotomy incision, a micropuncture kit was utilized to access the left common femoral vein vein under direct, real-time ultrasound guidance after the overlying soft tissues were anesthetized with 1% lidocaine with epinephrine. Ultrasound image documentation was performed. The microwire was kinked to measure appropriate catheter length. A stiff Glidewire was advanced to the level of the IVC and the micropuncture sheath was exchanged for a peel-away sheath. A AshSplit tunneled hemodialysis catheter  measuring 55 cm from tip to cuff was tunneled in a retrograde fashion from the left lateral thigh to the venotomy incision.  The catheter was then placed through the peel-away sheath with tips ultimately positioned within the inferior aspect of the right atrium. Final catheter positioning was confirmed and documented with a spot radiographic image. The catheter aspirates and flushes normally. The catheter was flushed with appropriate volume heparin dwells.  The catheter exit site was secured with a 0-Prolene retention suture. The venotomy incision was closed with an interrupted 4-0 Vicryl, Dermabond and Steri-strips. Dressings were applied. The patient tolerated the procedure well without immediate post procedural complication.  IMPRESSION: Successful placement of 55 cm tip to cuff tunneled hemodialysis catheter via the left common femoral vein vein with tips terminating  within the inferior aspect of the right atrium. The catheter is ready for immediate use.   Electronically Signed   By: Sandi Mariscal M.D.   On: 08/19/2015 15:59   Ir US Guide Vasc Access Left  08/19/2015   INDICATION: Failed attempted declot of right upper arm hero graft, in need of intravenous access for continuation of dialysis. Most recent history of left common femoral vein approach dialysis catheter placement.  EXAM: TUNNELED CENTRAL VENOUS HEMODIALYSIS CATHETER PLACEMENT WITH ULTRASOUND AND FLUOROSCOPIC GUIDANCE  COMPARISON:  Attempted right upper arm hero graft declot- 08/18/2015; left femoral vein approach dialysis catheter placement - 09/21/2014.  MEDICATIONS: Ancef 2 gm IV; The IV antibiotic was given in an appropriate time interval prior to skin puncture.  CONTRAST:  None  ANESTHESIA/SEDATION: Versed 2 mg IV; Fentanyl 100 mcg IV  Total Moderate Sedation Time  25 minutes.  FLUOROSCOPY TIME:  1 minute 18 seconds (70.3 mGy)  COMPLICATIONS: None immediate  PROCEDURE: Informed written consent was obtained from the patient after a discussion of  the risks, benefits, and alternatives to treatment. Questions regarding the procedure were encouraged and answered. The left groin was prepped with chlorhexidine in a sterile fashion, and a sterile drape was applied covering the operative field. Maximum barrier sterile technique with sterile gowns and gloves were used for the procedure. A timeout was performed prior to the initiation of the procedure.  After creating a small venotomy incision, a micropuncture kit was utilized to access the left common femoral vein vein under direct, real-time ultrasound guidance after the overlying soft tissues were anesthetized with 1% lidocaine with epinephrine. Ultrasound image documentation was performed. The microwire was kinked to measure appropriate catheter length. A stiff Glidewire was advanced to the level of the IVC and the micropuncture sheath was exchanged for a peel-away sheath. A AshSplit tunneled hemodialysis catheter measuring 55 cm from tip to cuff was tunneled in a retrograde fashion from the left lateral thigh to the venotomy incision.  The catheter was then placed through the peel-away sheath with tips ultimately positioned within the inferior aspect of the right atrium. Final catheter positioning was confirmed and documented with a spot radiographic image. The catheter aspirates and flushes normally. The catheter was flushed with appropriate volume heparin dwells.  The catheter exit site was secured with a 0-Prolene retention suture. The venotomy incision was closed with an interrupted 4-0 Vicryl, Dermabond and Steri-strips. Dressings were applied. The patient tolerated the procedure well without immediate post procedural complication.  IMPRESSION: Successful placement of 55 cm tip to cuff tunneled hemodialysis catheter via the left common femoral vein vein with tips terminating within the inferior aspect of the right atrium. The catheter is ready for immediate use.   Electronically Signed   By: Sandi Mariscal  M.D.   On: 08/19/2015 15:59   Medications: . sodium chloride 1,000 mL (08/18/15 1001)   . aspirin EC  325 mg Oral Daily  . calcitRIOL  0.75 mcg Oral Q T,Th,Sa-HD  . calcium acetate  2,001 mg Oral TID WC  . [START ON 08/22/2015] darbepoetin (ARANESP) injection - DIALYSIS  100 mcg Intravenous Q Tue-HD  . feeding supplement  1 Container Oral TID BM  . ferric gluconate (FERRLECIT/NULECIT) IV  125 mg Intravenous Q T,Th,Sa-HD  . fludrocortisone  0.2 mg Oral Daily  . gabapentin  100 mg Oral QHS  . heparin subcutaneous  5,000 Units Subcutaneous 3 times per day  . midodrine  10 mg Oral TID WC  . sodium chloride  3 mL Intravenous Q12H

## 2015-08-21 NOTE — Progress Notes (Signed)
Discharge instructions and medications discussed with patient.  Prescription given to patient.  Patient instructed to call for follow-up appts d/t offices closed today for Labor Day.  All questions answered.

## 2015-08-21 NOTE — Progress Notes (Signed)
Patient ID: Judy Green, female   DOB: September 16, 1966, 49 y.o.   MRN: [id number] [facility] Progress Note:   3 Days Post-Op  Subjective: Mental status is clear.  Patient is planning to see Dr. Ardis Hughs about followup colonoscopy.  If there are positive findings then will re-engage surgery for right colectomy Objective: Vital signs in last 24 hours: Temp:  [97.6 F (36.4 C)-98.5 F (36.9 C)] 98.5 F (36.9 C) (09/05 0747) Pulse Rate:  [85-109] 90 (09/05 0747) Resp:  [18-20] 18 (09/05 0747) BP: (88-107)/(34-69) 94/34 mmHg (09/05 0747) SpO2:  [91 %-95 %] 93 % (09/05 0747) Weight:  [108.9 kg (240 lb 1.3 oz)] 108.9 kg (240 lb 1.3 oz) (09/04 2109)  Intake/Output from previous day: 09/04 0701 - 09/05 0700 In: 720 [P.O.:720] Out: 301 [Urine:300; Stool:1] Intake/Output this shift:    Physical Exam: Work of breathing is normal;  PE not performed   Lab Results:  Results for orders placed or performed during the hospital encounter of 08/15/15 (from the past 48 hour(s))  Glucose, capillary     Status: None   Collection Time: 08/19/15 12:22 PM  Result Value Ref Range   Glucose-Capillary 73 65 - 99 mg/dL  Glucose, capillary     Status: None   Collection Time: 08/19/15  5:06 PM  Result Value Ref Range   Glucose-Capillary 80 65 - 99 mg/dL  Glucose, capillary     Status: None   Collection Time: 08/20/15  1:50 AM  Result Value Ref Range   Glucose-Capillary 68 65 - 99 mg/dL  Glucose, capillary     Status: None   Collection Time: 08/20/15  5:12 AM  Result Value Ref Range   Glucose-Capillary 92 65 - 99 mg/dL  Glucose, capillary     Status: None   Collection Time: 08/20/15  7:42 AM  Result Value Ref Range   Glucose-Capillary 88 65 - 99 mg/dL  Glucose, capillary     Status: Abnormal   Collection Time: 08/20/15 11:43 AM  Result Value Ref Range   Glucose-Capillary 100 (H) 65 - 99 mg/dL  Glucose, capillary     Status: Abnormal   Collection Time: 08/20/15  4:49 PM  Result  Value Ref Range   Glucose-Capillary 142 (H) 65 - 99 mg/dL  Glucose, capillary     Status: Abnormal   Collection Time: 08/20/15  9:08 PM  Result Value Ref Range   Glucose-Capillary 123 (H) 65 - 99 mg/dL  CBC     Status: Abnormal   Collection Time: 08/21/15  4:59 AM  Result Value Ref Range   WBC 6.8 4.0 - 10.5 K/uL   RBC 3.35 (L) 3.87 - 5.11 MIL/uL   Hemoglobin 10.4 (L) 12.0 - 15.0 g/dL   HCT 32.6 (L) 36.0 - 46.0 %   MCV 97.3 78.0 - 100.0 fL   MCH 31.0 26.0 - 34.0 pg   MCHC 31.9 30.0 - 36.0 g/dL   RDW 15.2 11.5 - 15.5 %   Platelets 237 150 - 400 K/uL  Renal function panel     Status: Abnormal   Collection Time: 08/21/15  4:59 AM  Result Value Ref Range   Sodium 133 (L) 135 - 145 mmol/L   Potassium 3.9 3.5 - 5.1 mmol/L   Chloride 93 (L) 101 - 111 mmol/L   CO2 25 22 - 32 mmol/L   Glucose, Bld 96 65 - 99 mg/dL   BUN 33 (H) 6 - 20 mg/dL   Creatinine, Ser 13.40 (H) 0.44 - 1.00  mg/dL   Calcium 8.7 (L) 8.9 - 10.3 mg/dL   Phosphorus 6.8 (H) 2.5 - 4.6 mg/dL   Albumin 3.3 (L) 3.5 - 5.0 g/dL   GFR calc non Af Amer 3 (L) >60 mL/min   GFR calc Af Amer 3 (L) >60 mL/min    Comment: (NOTE) The eGFR has been calculated using the CKD EPI equation. This calculation has not been validated in all clinical situations. eGFR's persistently <60 mL/min signify possible Chronic Kidney Disease.    Anion gap 15 5 - 15  Glucose, capillary     Status: Abnormal   Collection Time: 08/21/15  8:59 AM  Result Value Ref Range   Glucose-Capillary 121 (H) 65 - 99 mg/dL    Radiology/Results: Ir Fluoro Guide Cv Line Left  08/19/2015   INDICATION: Failed attempted declot of right upper arm hero graft, in need of intravenous access for continuation of dialysis. Most recent history of left common femoral vein approach dialysis catheter placement.  EXAM: TUNNELED CENTRAL VENOUS HEMODIALYSIS CATHETER PLACEMENT WITH ULTRASOUND AND FLUOROSCOPIC GUIDANCE  COMPARISON:  Attempted right upper arm hero graft declot-  08/18/2015; left femoral vein approach dialysis catheter placement - 09/21/2014.  MEDICATIONS: Ancef 2 gm IV; The IV antibiotic was given in an appropriate time interval prior to skin puncture.  CONTRAST:  None  ANESTHESIA/SEDATION: Versed 2 mg IV; Fentanyl 100 mcg IV  Total Moderate Sedation Time  25 minutes.  FLUOROSCOPY TIME:  1 minute 18 seconds (63.3 mGy)  COMPLICATIONS: None immediate  PROCEDURE: Informed written consent was obtained from the patient after a discussion of the risks, benefits, and alternatives to treatment. Questions regarding the procedure were encouraged and answered. The left groin was prepped with chlorhexidine in a sterile fashion, and a sterile drape was applied covering the operative field. Maximum barrier sterile technique with sterile gowns and gloves were used for the procedure. A timeout was performed prior to the initiation of the procedure.  After creating a small venotomy incision, a micropuncture kit was utilized to access the left common femoral vein vein under direct, real-time ultrasound guidance after the overlying soft tissues were anesthetized with 1% lidocaine with epinephrine. Ultrasound image documentation was performed. The microwire was kinked to measure appropriate catheter length. A stiff Glidewire was advanced to the level of the IVC and the micropuncture sheath was exchanged for a peel-away sheath. A AshSplit tunneled hemodialysis catheter measuring 55 cm from tip to cuff was tunneled in a retrograde fashion from the left lateral thigh to the venotomy incision.  The catheter was then placed through the peel-away sheath with tips ultimately positioned within the inferior aspect of the right atrium. Final catheter positioning was confirmed and documented with a spot radiographic image. The catheter aspirates and flushes normally. The catheter was flushed with appropriate volume heparin dwells.  The catheter exit site was secured with a 0-Prolene retention suture. The  venotomy incision was closed with an interrupted 4-0 Vicryl, Dermabond and Steri-strips. Dressings were applied. The patient tolerated the procedure well without immediate post procedural complication.  IMPRESSION: Successful placement of 55 cm tip to cuff tunneled hemodialysis catheter via the left common femoral vein vein with tips terminating within the inferior aspect of the right atrium. The catheter is ready for immediate use.   Electronically Signed   By: Sandi Mariscal M.D.   On: 08/19/2015 15:59   Ir US Guide Vasc Access Left  08/19/2015   INDICATION: Failed attempted declot of right upper arm hero graft, in need of  intravenous access for continuation of dialysis. Most recent history of left common femoral vein approach dialysis catheter placement.  EXAM: TUNNELED CENTRAL VENOUS HEMODIALYSIS CATHETER PLACEMENT WITH ULTRASOUND AND FLUOROSCOPIC GUIDANCE  COMPARISON:  Attempted right upper arm hero graft declot- 08/18/2015; left femoral vein approach dialysis catheter placement - 09/21/2014.  MEDICATIONS: Ancef 2 gm IV; The IV antibiotic was given in an appropriate time interval prior to skin puncture.  CONTRAST:  None  ANESTHESIA/SEDATION: Versed 2 mg IV; Fentanyl 100 mcg IV  Total Moderate Sedation Time  25 minutes.  FLUOROSCOPY TIME:  1 minute 18 seconds (16.1 mGy)  COMPLICATIONS: None immediate  PROCEDURE: Informed written consent was obtained from the patient after a discussion of the risks, benefits, and alternatives to treatment. Questions regarding the procedure were encouraged and answered. The left groin was prepped with chlorhexidine in a sterile fashion, and a sterile drape was applied covering the operative field. Maximum barrier sterile technique with sterile gowns and gloves were used for the procedure. A timeout was performed prior to the initiation of the procedure.  After creating a small venotomy incision, a micropuncture kit was utilized to access the left common femoral vein vein under  direct, real-time ultrasound guidance after the overlying soft tissues were anesthetized with 1% lidocaine with epinephrine. Ultrasound image documentation was performed. The microwire was kinked to measure appropriate catheter length. A stiff Glidewire was advanced to the level of the IVC and the micropuncture sheath was exchanged for a peel-away sheath. A AshSplit tunneled hemodialysis catheter measuring 55 cm from tip to cuff was tunneled in a retrograde fashion from the left lateral thigh to the venotomy incision.  The catheter was then placed through the peel-away sheath with tips ultimately positioned within the inferior aspect of the right atrium. Final catheter positioning was confirmed and documented with a spot radiographic image. The catheter aspirates and flushes normally. The catheter was flushed with appropriate volume heparin dwells.  The catheter exit site was secured with a 0-Prolene retention suture. The venotomy incision was closed with an interrupted 4-0 Vicryl, Dermabond and Steri-strips. Dressings were applied. The patient tolerated the procedure well without immediate post procedural complication.  IMPRESSION: Successful placement of 55 cm tip to cuff tunneled hemodialysis catheter via the left common femoral vein vein with tips terminating within the inferior aspect of the right atrium. The catheter is ready for immediate use.   Electronically Signed   By: Sandi Mariscal M.D.   On: 08/19/2015 15:59    Anti-infectives: Anti-infectives    Start     Dose/Rate Route Frequency Ordered Stop   08/19/15 1200  ceFAZolin (ANCEF) IVPB 2 g/50 mL premix     2 g 100 mL/hr over 30 Minutes Intravenous To Radiology 08/19/15 1051 08/19/15 1530   08/17/15 2000  ciprofloxacin (CIPRO) IVPB 400 mg  Status:  Discontinued     400 mg 200 mL/hr over 60 Minutes Intravenous Every 24 hours 08/17/15 1007 08/17/15 1437   08/17/15 1200  vancomycin (VANCOCIN) IVPB 1000 mg/200 mL premix  Status:  Discontinued      1,000 mg 200 mL/hr over 60 Minutes Intravenous Every T-Th-Sa (Hemodialysis) 08/16/15 0646 08/16/15 1220   08/17/15 1200  metroNIDAZOLE (FLAGYL) IVPB 500 mg  Status:  Discontinued     500 mg 100 mL/hr over 60 Minutes Intravenous Every 8 hours 08/17/15 1007 08/17/15 1437   08/16/15 2200  ciprofloxacin (CIPRO) IVPB 400 mg  Status:  Discontinued     400 mg 200 mL/hr over 60 Minutes Intravenous  Every 24 hours 08/15/15 2329 08/16/15 0825   08/16/15 1800  ciprofloxacin (CIPRO) IVPB 400 mg  Status:  Discontinued     400 mg 200 mL/hr over 60 Minutes Intravenous Every 24 hours 08/16/15 1300 08/16/15 1605   08/16/15 1300  metroNIDAZOLE (FLAGYL) IVPB 500 mg  Status:  Discontinued     500 mg 100 mL/hr over 60 Minutes Intravenous Every 8 hours 08/16/15 1221 08/16/15 1605   08/16/15 0800  piperacillin-tazobactam (ZOSYN) IVPB 2.25 g  Status:  Discontinued     2.25 g 100 mL/hr over 30 Minutes Intravenous Every 8 hours 08/16/15 0645 08/16/15 1220   08/16/15 0700  vancomycin (VANCOCIN) 2,500 mg in sodium chloride 0.9 % 500 mL IVPB     2,500 mg 250 mL/hr over 120 Minutes Intravenous  Once 08/16/15 0645 08/16/15 0941   08/16/15 0600  metroNIDAZOLE (FLAGYL) IVPB 500 mg  Status:  Discontinued     500 mg 100 mL/hr over 60 Minutes Intravenous Every 8 hours 08/15/15 2310 08/16/15 0636   08/15/15 2130  ciprofloxacin (CIPRO) IVPB 400 mg     400 mg 200 mL/hr over 60 Minutes Intravenous  Once 08/15/15 2123 08/15/15 2317   08/15/15 2130  metroNIDAZOLE (FLAGYL) IVPB 500 mg     500 mg 100 mL/hr over 60 Minutes Intravenous  Once 08/15/15 2123 08/15/15 2317      Assessment/Plan: Problem List: Patient Active Problem List   Diagnosis Date Noted  . Clotted renal dialysis AV graft   . ESRD (end stage renal disease)   . Acute ischemic colitis   . Abnormal CT scan, gastrointestinal tract   . Anemia in chronic kidney disease   . Colitis 08/15/2015  . Groin hematoma 09/22/2014  . Muscle cramps 09/22/2014  . History  of multiple failed HD accesses 09/19/2014  . Breast cancer   . Anemia   . ESRD on hemodialysis 07/22/2012  . Chronic hypotension 07/22/2012   Surgery will see again if needed after repeat colonoscopy per Dr. Ardis Hughs (her GI) 3 Days Post-Op    LOS: 6 days   Matt B. Hassell Done, MD, [facility] [phone] beeper [phone]  08/21/2015 9:26 AM

## 2015-08-21 NOTE — Care Management Important Message (Signed)
Important Message  Patient Details  Name: Judy Green MRN: [id number] Date of Birth: Jan 02, 1966   Medicare Important Message Given:  Yes-third notification given    Loann Quill 08/21/2015, 9:17 AM

## 2015-08-22 ENCOUNTER — Telehealth: Payer: Self-pay

## 2015-08-22 NOTE — Telephone Encounter (Signed)
Pt has appt with Amy on 08/30/15 130 pm pt has been notified

## 2015-08-22 NOTE — Telephone Encounter (Signed)
-----   Message from Jerene Bears, MD sent at 08/20/2015  5:03 PM EDT ----- Regarding: Hospital patient Dan/Misheel Gowans   Mrs. Hazelip -  Known to you from screening colonoscopy in 2015. Admitted with right-sided abdominal pain abnormal imaging. 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   Biopsies show ischemic colitis. No evidence of neoplasia or IBD.  Clinically she has improved.  Surgery was following and initially recommended resection , but in light of pathology , I do not feel surgery is necessary. I spoke with Norberto Sorenson who also is in agreement.   Seems reasonable to me to see the patient in the office in 7-10 days either Dr. Ardis Hughs or with an advanced practitioner to ensure improvement. Then follow-up colonoscopy in 6-12 weeks to document healing.   Pattiy can you please get her an appointment in about a week with Dr. Ardis Hughs or advanced practitioner.  Thanks Ulice Dash

## 2015-08-23 ENCOUNTER — Inpatient Hospital Stay (HOSPITAL_COMMUNITY): Payer: Medicare Other

## 2015-08-23 ENCOUNTER — Encounter (HOSPITAL_COMMUNITY): Payer: Self-pay | Admitting: Emergency Medicine

## 2015-08-23 ENCOUNTER — Emergency Department (HOSPITAL_COMMUNITY): Payer: Medicare Other

## 2015-08-23 ENCOUNTER — Inpatient Hospital Stay (HOSPITAL_COMMUNITY)
Admission: EM | Admit: 2015-08-23 | Discharge: 2015-09-16 | DRG: 208 | Disposition: E | Payer: Medicare Other | Attending: Internal Medicine | Admitting: Internal Medicine

## 2015-08-23 DIAGNOSIS — E213 Hyperparathyroidism, unspecified: Secondary | ICD-10-CM | POA: Diagnosis present

## 2015-08-23 DIAGNOSIS — I469 Cardiac arrest, cause unspecified: Secondary | ICD-10-CM

## 2015-08-23 DIAGNOSIS — R42 Dizziness and giddiness: Secondary | ICD-10-CM | POA: Diagnosis present

## 2015-08-23 DIAGNOSIS — Z7982 Long term (current) use of aspirin: Secondary | ICD-10-CM | POA: Diagnosis not present

## 2015-08-23 DIAGNOSIS — E872 Acidosis, unspecified: Secondary | ICD-10-CM

## 2015-08-23 DIAGNOSIS — R7989 Other specified abnormal findings of blood chemistry: Secondary | ICD-10-CM

## 2015-08-23 DIAGNOSIS — R570 Cardiogenic shock: Secondary | ICD-10-CM | POA: Diagnosis present

## 2015-08-23 DIAGNOSIS — I2699 Other pulmonary embolism without acute cor pulmonale: Principal | ICD-10-CM

## 2015-08-23 DIAGNOSIS — Z992 Dependence on renal dialysis: Secondary | ICD-10-CM

## 2015-08-23 DIAGNOSIS — R112 Nausea with vomiting, unspecified: Secondary | ICD-10-CM

## 2015-08-23 DIAGNOSIS — R197 Diarrhea, unspecified: Secondary | ICD-10-CM

## 2015-08-23 DIAGNOSIS — J9600 Acute respiratory failure, unspecified whether with hypoxia or hypercapnia: Secondary | ICD-10-CM

## 2015-08-23 DIAGNOSIS — I120 Hypertensive chronic kidney disease with stage 5 chronic kidney disease or end stage renal disease: Secondary | ICD-10-CM | POA: Diagnosis present

## 2015-08-23 DIAGNOSIS — R001 Bradycardia, unspecified: Secondary | ICD-10-CM | POA: Diagnosis not present

## 2015-08-23 DIAGNOSIS — K5790 Diverticulosis of intestine, part unspecified, without perforation or abscess without bleeding: Secondary | ICD-10-CM | POA: Diagnosis present

## 2015-08-23 DIAGNOSIS — E2740 Unspecified adrenocortical insufficiency: Secondary | ICD-10-CM | POA: Diagnosis present

## 2015-08-23 DIAGNOSIS — T8612 Kidney transplant failure: Secondary | ICD-10-CM | POA: Diagnosis present

## 2015-08-23 DIAGNOSIS — R569 Unspecified convulsions: Secondary | ICD-10-CM | POA: Diagnosis present

## 2015-08-23 DIAGNOSIS — R4020 Unspecified coma: Secondary | ICD-10-CM | POA: Diagnosis not present

## 2015-08-23 DIAGNOSIS — I951 Orthostatic hypotension: Secondary | ICD-10-CM | POA: Diagnosis present

## 2015-08-23 DIAGNOSIS — G4733 Obstructive sleep apnea (adult) (pediatric): Secondary | ICD-10-CM | POA: Diagnosis present

## 2015-08-23 DIAGNOSIS — G9340 Encephalopathy, unspecified: Secondary | ICD-10-CM | POA: Diagnosis present

## 2015-08-23 DIAGNOSIS — N186 End stage renal disease: Secondary | ICD-10-CM

## 2015-08-23 DIAGNOSIS — R10812 Left upper quadrant abdominal tenderness: Secondary | ICD-10-CM

## 2015-08-23 DIAGNOSIS — IMO0001 Reserved for inherently not codable concepts without codable children: Secondary | ICD-10-CM

## 2015-08-23 DIAGNOSIS — R579 Shock, unspecified: Secondary | ICD-10-CM

## 2015-08-23 DIAGNOSIS — D638 Anemia in other chronic diseases classified elsewhere: Secondary | ICD-10-CM | POA: Diagnosis present

## 2015-08-23 DIAGNOSIS — Z87891 Personal history of nicotine dependence: Secondary | ICD-10-CM

## 2015-08-23 DIAGNOSIS — G8929 Other chronic pain: Secondary | ICD-10-CM | POA: Diagnosis present

## 2015-08-23 DIAGNOSIS — Z79891 Long term (current) use of opiate analgesic: Secondary | ICD-10-CM

## 2015-08-23 DIAGNOSIS — R000 Tachycardia, unspecified: Secondary | ICD-10-CM | POA: Diagnosis present

## 2015-08-23 DIAGNOSIS — Z853 Personal history of malignant neoplasm of breast: Secondary | ICD-10-CM

## 2015-08-23 DIAGNOSIS — I959 Hypotension, unspecified: Secondary | ICD-10-CM | POA: Diagnosis present

## 2015-08-23 DIAGNOSIS — R5383 Other fatigue: Secondary | ICD-10-CM

## 2015-08-23 DIAGNOSIS — R778 Other specified abnormalities of plasma proteins: Secondary | ICD-10-CM

## 2015-08-23 DIAGNOSIS — R52 Pain, unspecified: Secondary | ICD-10-CM

## 2015-08-23 DIAGNOSIS — R40243 Glasgow coma scale score 3-8: Secondary | ICD-10-CM

## 2015-08-23 DIAGNOSIS — Z79899 Other long term (current) drug therapy: Secondary | ICD-10-CM | POA: Diagnosis not present

## 2015-08-23 DIAGNOSIS — Z531 Procedure and treatment not carried out because of patient's decision for reasons of belief and group pressure: Secondary | ICD-10-CM | POA: Diagnosis present

## 2015-08-23 DIAGNOSIS — R079 Chest pain, unspecified: Secondary | ICD-10-CM

## 2015-08-23 LAB — COMPREHENSIVE METABOLIC PANEL
ALBUMIN: 3.7 g/dL (ref 3.5–5.0)
ALBUMIN: 1.9 g/dL — AB (ref 3.5–5.0)
ALK PHOS: 85 U/L (ref 38–126)
ALK PHOS: 63 U/L (ref 38–126)
ALT: 18 U/L (ref 14–54)
ALT: 135 U/L — AB (ref 14–54)
AST: 36 U/L (ref 15–41)
AST: 229 U/L — ABNORMAL HIGH (ref 15–41)
Anion gap: 21 — ABNORMAL HIGH (ref 5–15)
Anion gap: 15 (ref 5–15)
BUN: 34 mg/dL — ABNORMAL HIGH (ref 6–20)
BUN: 27 mg/dL — AB (ref 6–20)
CALCIUM: 8.5 mg/dL — AB (ref 8.9–10.3)
CALCIUM: 6.7 mg/dL — AB (ref 8.9–10.3)
CO2: 19 mmol/L — AB (ref 22–32)
CO2: 13 mmol/L — AB (ref 22–32)
CREATININE: 12.39 mg/dL — AB (ref 0.44–1.00)
CREATININE: 8.85 mg/dL — AB (ref 0.44–1.00)
Chloride: 94 mmol/L — ABNORMAL LOW (ref 101–111)
Chloride: 114 mmol/L — ABNORMAL HIGH (ref 101–111)
GFR calc Af Amer: 4 mL/min — ABNORMAL LOW (ref >60)
GFR calc non Af Amer: 3 mL/min — ABNORMAL LOW (ref >60)
GFR calc non Af Amer: 5 mL/min — ABNORMAL LOW (ref >60)
GFR, EST AFRICAN AMERICAN: 5 mL/min — AB (ref >60)
GLUCOSE: 159 mg/dL — AB (ref 65–99)
GLUCOSE: 250 mg/dL — AB (ref 65–99)
Potassium: 4.5 mmol/L (ref 3.5–5.1)
Potassium: 3 mmol/L — ABNORMAL LOW (ref 3.5–5.1)
SODIUM: 134 mmol/L — AB (ref 135–145)
SODIUM: 142 mmol/L (ref 135–145)
Total Bilirubin: 0.7 mg/dL (ref 0.3–1.2)
Total Bilirubin: 0.6 mg/dL (ref 0.3–1.2)
Total Protein: 7.5 g/dL (ref 6.5–8.1)
Total Protein: 4.4 g/dL — ABNORMAL LOW (ref 6.5–8.1)

## 2015-08-23 LAB — CBC WITH DIFFERENTIAL/PLATELET
BASOS ABS: 0 10*3/uL (ref 0.0–0.1)
BASOS PCT: 0 % (ref 0–1)
Basophils Absolute: 0 10*3/uL (ref 0.0–0.1)
Basophils Relative: 0 % (ref 0–1)
EOS ABS: 0.2 10*3/uL (ref 0.0–0.7)
EOS PCT: 1 % (ref 0–5)
Eosinophils Absolute: 0.2 10*3/uL (ref 0.0–0.7)
Eosinophils Relative: 2 % (ref 0–5)
HEMATOCRIT: 36.6 % (ref 36.0–46.0)
HEMATOCRIT: 27.2 % — AB (ref 36.0–46.0)
Hemoglobin: 11.7 g/dL — ABNORMAL LOW (ref 12.0–15.0)
Hemoglobin: 8.4 g/dL — ABNORMAL LOW (ref 12.0–15.0)
LYMPHS ABS: 2.5 10*3/uL (ref 0.7–4.0)
LYMPHS PCT: 9 % — AB (ref 12–46)
Lymphocytes Relative: 11 % — ABNORMAL LOW (ref 12–46)
Lymphs Abs: 1 10*3/uL (ref 0.7–4.0)
MCH: 31.2 pg (ref 26.0–34.0)
MCH: 31 pg (ref 26.0–34.0)
MCHC: 32 g/dL (ref 30.0–36.0)
MCHC: 30.9 g/dL (ref 30.0–36.0)
MCV: 97.6 fL (ref 78.0–100.0)
MCV: 100.4 fL — AB (ref 78.0–100.0)
MONO ABS: 0.6 10*3/uL (ref 0.1–1.0)
MONO ABS: 0.6 10*3/uL (ref 0.1–1.0)
MONOS PCT: 6 % (ref 3–12)
MONOS PCT: 2 % — AB (ref 3–12)
NEUTROS ABS: 24.9 10*3/uL — AB (ref 1.7–7.7)
Neutro Abs: 7.6 10*3/uL (ref 1.7–7.7)
Neutrophils Relative %: 81 % — ABNORMAL HIGH (ref 43–77)
Neutrophils Relative %: 89 % — ABNORMAL HIGH (ref 43–77)
Platelets: 305 10*3/uL (ref 150–400)
Platelets: 187 10*3/uL (ref 150–400)
RBC: 3.75 MIL/uL — ABNORMAL LOW (ref 3.87–5.11)
RBC: 2.71 MIL/uL — ABNORMAL LOW (ref 3.87–5.11)
RDW: 15.1 % (ref 11.5–15.5)
RDW: 15.7 % — AB (ref 11.5–15.5)
WBC: 9.5 10*3/uL (ref 4.0–10.5)
WBC: 28.1 10*3/uL — ABNORMAL HIGH (ref 4.0–10.5)

## 2015-08-23 LAB — BASIC METABOLIC PANEL
ANION GAP: 15 (ref 5–15)
BUN: 26 mg/dL — ABNORMAL HIGH (ref 6–20)
CALCIUM: 6.7 mg/dL — AB (ref 8.9–10.3)
CO2: 13 mmol/L — ABNORMAL LOW (ref 22–32)
CREATININE: 8.73 mg/dL — AB (ref 0.44–1.00)
Chloride: 114 mmol/L — ABNORMAL HIGH (ref 101–111)
GFR calc non Af Amer: 5 mL/min — ABNORMAL LOW (ref >60)
GFR, EST AFRICAN AMERICAN: 5 mL/min — AB (ref >60)
Glucose, Bld: 251 mg/dL — ABNORMAL HIGH (ref 65–99)
Potassium: 3 mmol/L — ABNORMAL LOW (ref 3.5–5.1)
SODIUM: 142 mmol/L (ref 135–145)

## 2015-08-23 LAB — LACTIC ACID, PLASMA: Lactic Acid, Venous: 7.8 mmol/L (ref 0.5–2.0)

## 2015-08-23 LAB — TROPONIN I
Troponin I: 0.21 ng/mL — ABNORMAL HIGH (ref <0.031)
Troponin I: 0.25 ng/mL — ABNORMAL HIGH (ref <0.031)

## 2015-08-23 LAB — POC OCCULT BLOOD, ED: Fecal Occult Bld: NEGATIVE

## 2015-08-23 LAB — I-STAT VENOUS BLOOD GAS, ED
ACID-BASE DEFICIT: 18 mmol/L — AB (ref 0.0–2.0)
Bicarbonate: 12.7 mEq/L — ABNORMAL LOW (ref 20.0–24.0)
O2 Saturation: 94 %
PH VEN: 7.01 — AB (ref 7.250–7.300)
TCO2: 14 mmol/L (ref 0–100)
pCO2, Ven: 50.5 mmHg — ABNORMAL HIGH (ref 45.0–50.0)
pO2, Ven: 103 mmHg — ABNORMAL HIGH (ref 30.0–45.0)

## 2015-08-23 LAB — I-STAT ARTERIAL BLOOD GAS, ED
ACID-BASE DEFICIT: 15 mmol/L — AB (ref 0.0–2.0)
Bicarbonate: 16.2 mEq/L — ABNORMAL LOW (ref 20.0–24.0)
O2 SAT: 90 %
PH ART: 7.005 — AB (ref 7.350–7.450)
TCO2: 18 mmol/L (ref 0–100)
pCO2 arterial: 65.1 mmHg (ref 35.0–45.0)
pO2, Arterial: 87 mmHg (ref 80.0–100.0)

## 2015-08-23 LAB — CORTISOL

## 2015-08-23 LAB — PROCALCITONIN: Procalcitonin: 1.15 ng/mL

## 2015-08-23 LAB — I-STAT CHEM 8, ED
BUN: 52 mg/dL — AB (ref 6–20)
CALCIUM ION: 0.93 mmol/L — AB (ref 1.12–1.23)
CREATININE: 12.4 mg/dL — AB (ref 0.44–1.00)
Chloride: 106 mmol/L (ref 101–111)
GLUCOSE: 146 mg/dL — AB (ref 65–99)
HCT: 43 % (ref 36.0–46.0)
HEMOGLOBIN: 14.6 g/dL (ref 12.0–15.0)
Potassium: 7.4 mmol/L (ref 3.5–5.1)
Sodium: 135 mmol/L (ref 135–145)
TCO2: 16 mmol/L (ref 0–100)

## 2015-08-23 LAB — I-STAT CG4 LACTIC ACID, ED
Lactic Acid, Venous: 4.02 mmol/L (ref 0.5–2.0)
Lactic Acid, Venous: 8.87 mmol/L (ref 0.5–2.0)

## 2015-08-23 LAB — GLUCOSE, CAPILLARY: GLUCOSE-CAPILLARY: 222 mg/dL — AB (ref 65–99)

## 2015-08-23 LAB — D-DIMER, QUANTITATIVE: D-Dimer, Quant: >20 ug/mL-FEU — ABNORMAL HIGH (ref 0.00–0.48)

## 2015-08-23 LAB — TYPE AND SCREEN
ABO/RH(D): AB POS
ANTIBODY SCREEN: NEGATIVE

## 2015-08-23 LAB — LIPASE, BLOOD
LIPASE: 21 U/L — AB (ref 22–51)
Lipase: 16 U/L — ABNORMAL LOW (ref 22–51)

## 2015-08-23 LAB — CBG MONITORING, ED: GLUCOSE-CAPILLARY: 112 mg/dL — AB (ref 65–99)

## 2015-08-23 LAB — PROTIME-INR
INR: 1.66 — ABNORMAL HIGH (ref 0.00–1.49)
Prothrombin Time: 19.7 seconds — ABNORMAL HIGH (ref 11.6–15.2)

## 2015-08-23 LAB — I-STAT TROPONIN, ED: Troponin i, poc: 0.2 ng/mL (ref 0.00–0.08)

## 2015-08-23 LAB — AMYLASE: Amylase: 73 U/L (ref 28–100)

## 2015-08-23 MED ORDER — INSULIN ASPART 100 UNIT/ML ~~LOC~~ SOLN
10.0000 [IU] | Freq: Once | SUBCUTANEOUS | Status: AC
  Administered 2015-08-23: 10 [IU] via INTRAVENOUS

## 2015-08-23 MED ORDER — ROCURONIUM BROMIDE 50 MG/5ML IV SOLN
INTRAVENOUS | Status: AC
  Filled 2015-08-23: qty 2

## 2015-08-23 MED ORDER — PANTOPRAZOLE SODIUM 40 MG IV SOLR
40.0000 mg | Freq: Every day | INTRAVENOUS | Status: DC
  Administered 2015-08-23: 40 mg via INTRAVENOUS
  Filled 2015-08-23 (×2): qty 40

## 2015-08-23 MED ORDER — SUCCINYLCHOLINE CHLORIDE 20 MG/ML IJ SOLN
INTRAMUSCULAR | Status: AC
  Filled 2015-08-23: qty 1

## 2015-08-23 MED ORDER — DEXTROSE 5 % IV SOLN
0.0000 ug/min | INTRAVENOUS | Status: DC
  Administered 2015-08-23: 50 ug/min via INTRAVENOUS
  Filled 2015-08-23: qty 16

## 2015-08-23 MED ORDER — ONDANSETRON 4 MG PO TBDP
8.0000 mg | ORAL_TABLET | Freq: Once | ORAL | Status: AC
  Administered 2015-08-23: 8 mg via ORAL
  Filled 2015-08-23: qty 2

## 2015-08-23 MED ORDER — POTASSIUM CHLORIDE CRYS ER 20 MEQ PO TBCR: 20.0000 meq | EXTENDED_RELEASE_TABLET | Freq: Once | ORAL | Status: DC

## 2015-08-23 MED ORDER — IOHEXOL 350 MG/ML SOLN: 100.0000 mL | Freq: Once | INTRAVENOUS | Status: DC | PRN

## 2015-08-23 MED ORDER — NOREPINEPHRINE BITARTRATE 1 MG/ML IV SOLN
0.0000 ug/min | Freq: Once | INTRAVENOUS | Status: AC
  Administered 2015-08-23: 15 ug/min via INTRAVENOUS
  Filled 2015-08-23: qty 4

## 2015-08-23 MED ORDER — HYDROCORTISONE NA SUCCINATE PF 100 MG IJ SOLR
100.0000 mg | Freq: Once | INTRAMUSCULAR | Status: AC
  Administered 2015-08-23: 100 mg via INTRAVENOUS
  Filled 2015-08-23: qty 2

## 2015-08-23 MED ORDER — PIPERACILLIN-TAZOBACTAM IN DEX 2-0.25 GM/50ML IV SOLN
2.2500 g | Freq: Three times a day (TID) | INTRAVENOUS | Status: DC
  Filled 2015-08-23 (×3): qty 50

## 2015-08-23 MED ORDER — VANCOMYCIN HCL IN DEXTROSE 1-5 GM/200ML-% IV SOLN
1000.0000 mg | INTRAVENOUS | Status: DC
  Filled 2015-08-23 (×2): qty 200

## 2015-08-23 MED ORDER — SODIUM BICARBONATE 8.4 % IV SOLN
INTRAVENOUS | Status: AC | PRN
  Administered 2015-08-23 (×2): 50 meq via INTRAVENOUS

## 2015-08-23 MED ORDER — HYDROCORTISONE NA SUCCINATE PF 100 MG IJ SOLR
50.0000 mg | Freq: Four times a day (QID) | INTRAMUSCULAR | Status: DC
Start: 2015-08-23 — End: 2015-08-24
  Administered 2015-08-23: 50 mg via INTRAVENOUS
  Filled 2015-08-23: qty 1
  Filled 2015-08-23: qty 2
  Filled 2015-08-23 (×3): qty 1

## 2015-08-23 MED ORDER — LORAZEPAM 2 MG/ML IJ SOLN
INTRAMUSCULAR | Status: AC
  Filled 2015-08-23: qty 1

## 2015-08-23 MED ORDER — BISACODYL 10 MG RE SUPP
10.0000 mg | Freq: Every day | RECTAL | Status: DC | PRN
Start: 2015-08-23 — End: 2015-08-23

## 2015-08-23 MED ORDER — FENTANYL CITRATE (PF) 100 MCG/2ML IJ SOLN: 100.0000 ug | INTRAMUSCULAR | Status: DC | PRN

## 2015-08-23 MED ORDER — FENTANYL CITRATE (PF) 100 MCG/2ML IJ SOLN
INTRAMUSCULAR | Status: AC
  Administered 2015-08-23: 50 ug
  Filled 2015-08-23: qty 2

## 2015-08-23 MED ORDER — SODIUM CHLORIDE 0.9 % IV SOLN: 250.0000 mL | INTRAVENOUS | Status: DC | PRN

## 2015-08-23 MED ORDER — LIDOCAINE HCL (CARDIAC) 20 MG/ML IV SOLN
INTRAVENOUS | Status: AC
  Filled 2015-08-23: qty 5

## 2015-08-23 MED ORDER — ETOMIDATE 2 MG/ML IV SOLN
INTRAVENOUS | Status: AC
  Filled 2015-08-23: qty 20

## 2015-08-23 MED ORDER — SODIUM CHLORIDE 0.9 % IV BOLUS (SEPSIS): 1000.0000 mL | Freq: Once | INTRAVENOUS | Status: DC

## 2015-08-23 MED ORDER — FENTANYL CITRATE (PF) 100 MCG/2ML IJ SOLN
100.0000 ug | INTRAMUSCULAR | Status: DC | PRN
  Filled 2015-08-23: qty 2

## 2015-08-23 MED ORDER — PROMETHAZINE HCL 25 MG/ML IJ SOLN
25.0000 mg | Freq: Once | INTRAMUSCULAR | Status: AC
  Administered 2015-08-23: 25 mg via INTRAVENOUS
  Filled 2015-08-23: qty 1

## 2015-08-23 MED ORDER — ETOMIDATE 2 MG/ML IV SOLN
INTRAVENOUS | Status: DC | PRN
  Administered 2015-08-23: 25 mg via INTRAVENOUS

## 2015-08-23 MED ORDER — FENTANYL CITRATE (PF) 100 MCG/2ML IJ SOLN
100.0000 ug | INTRAMUSCULAR | Status: DC | PRN
Start: 2015-08-23 — End: 2015-08-24
  Administered 2015-08-23: 100 ug via INTRAVENOUS
  Administered 2015-08-23: 50 ug via INTRAVENOUS

## 2015-08-23 MED ORDER — POTASSIUM CHLORIDE 20 MEQ/15ML (10%) PO SOLN
20.0000 meq | Freq: Once | ORAL | Status: AC
  Administered 2015-08-23: 20 meq via ORAL
  Filled 2015-08-23: qty 15

## 2015-08-23 MED ORDER — SUCCINYLCHOLINE CHLORIDE 20 MG/ML IJ SOLN
INTRAMUSCULAR | Status: DC | PRN
  Administered 2015-08-23: 100 mg via INTRAVENOUS

## 2015-08-23 MED ORDER — CALCIUM CHLORIDE 10 % IV SOLN
INTRAVENOUS | Status: AC | PRN
  Administered 2015-08-23 (×2): 1 g via INTRAVENOUS

## 2015-08-23 MED ORDER — MIDAZOLAM HCL 2 MG/2ML IJ SOLN
2.0000 mg | INTRAMUSCULAR | Status: DC | PRN
  Administered 2015-08-23 (×2): 2 mg via INTRAVENOUS
  Filled 2015-08-23: qty 2

## 2015-08-23 MED ORDER — HEPARIN (PORCINE) IN NACL 100-0.45 UNIT/ML-% IJ SOLN
1500.0000 [IU]/h | INTRAMUSCULAR | Status: DC
  Administered 2015-08-23: 1500 [IU]/h via INTRAVENOUS
  Filled 2015-08-23 (×2): qty 250

## 2015-08-23 MED ORDER — MIDAZOLAM HCL 2 MG/2ML IJ SOLN
INTRAMUSCULAR | Status: AC
  Administered 2015-08-23: 2 mg
  Filled 2015-08-23: qty 4

## 2015-08-23 MED ORDER — DEXTROSE 50 % IV SOLN
INTRAVENOUS | Status: DC | PRN
  Administered 2015-08-23: 1 via INTRAVENOUS

## 2015-08-23 MED ORDER — VASOPRESSIN 20 UNIT/ML IV SOLN
0.0300 [IU]/min | INTRAVENOUS | Status: DC
  Administered 2015-08-23: 0.03 [IU]/min via INTRAVENOUS
  Filled 2015-08-23: qty 2

## 2015-08-23 MED ORDER — SODIUM BICARBONATE 8.4 % IV SOLN
Freq: Once | INTRAVENOUS | Status: DC
  Filled 2015-08-23: qty 1000

## 2015-08-23 MED ORDER — PIPERACILLIN-TAZOBACTAM 3.375 G IVPB 30 MIN
3.3750 g | Freq: Once | INTRAVENOUS | Status: AC
  Administered 2015-08-23: 3.375 g via INTRAVENOUS
  Filled 2015-08-23: qty 50

## 2015-08-23 MED ORDER — ALBUTEROL SULFATE (2.5 MG/3ML) 0.083% IN NEBU: 2.5000 mg | INHALATION_SOLUTION | RESPIRATORY_TRACT | Status: DC | PRN

## 2015-08-23 MED ORDER — ALTEPLASE (PULMONARY EMBOLISM) INFUSION
50.0000 mg | Freq: Once | INTRAVENOUS | Status: AC
  Administered 2015-08-23: 50 mg via INTRAVENOUS
  Filled 2015-08-23: qty 50

## 2015-08-23 MED ORDER — SODIUM BICARBONATE 8.4 % IV SOLN
INTRAVENOUS | Status: DC
  Administered 2015-08-23: 20:00:00 via INTRAVENOUS
  Filled 2015-08-23 (×2): qty 150

## 2015-08-23 MED ORDER — VANCOMYCIN HCL 10 G IV SOLR
2000.0000 mg | Freq: Once | INTRAVENOUS | Status: AC
  Administered 2015-08-23: 2000 mg via INTRAVENOUS
  Filled 2015-08-23: qty 2000

## 2015-08-23 MED ORDER — EPINEPHRINE HCL 0.1 MG/ML IJ SOSY
PREFILLED_SYRINGE | INTRAMUSCULAR | Status: DC | PRN
  Administered 2015-08-23 (×3): 1 via INTRAVENOUS

## 2015-08-23 MED ORDER — DOCUSATE SODIUM 50 MG/5ML PO LIQD: 100.0000 mg | Freq: Two times a day (BID) | ORAL | Status: DC | PRN

## 2015-08-23 MED ORDER — HEPARIN BOLUS VIA INFUSION
5000.0000 [IU] | Freq: Once | INTRAVENOUS | Status: AC
  Administered 2015-08-23: 5000 [IU] via INTRAVENOUS
  Filled 2015-08-23: qty 5000

## 2015-08-23 MED ORDER — SODIUM CHLORIDE 0.9 % IV BOLUS (SEPSIS)
1000.0000 mL | INTRAVENOUS | Status: AC
  Administered 2015-08-23 (×2): 1000 mL via INTRAVENOUS

## 2015-08-23 MED ORDER — MIDAZOLAM HCL 2 MG/2ML IJ SOLN
2.0000 mg | INTRAMUSCULAR | Status: DC | PRN
  Administered 2015-08-23: 2 mg via INTRAVENOUS
  Filled 2015-08-23: qty 2

## 2015-08-23 MED ORDER — FENTANYL CITRATE (PF) 100 MCG/2ML IJ SOLN
INTRAMUSCULAR | Status: AC
  Filled 2015-08-23: qty 2

## 2015-08-23 MED ORDER — SODIUM BICARBONATE 8.4 % IV SOLN
Freq: Once | INTRAVENOUS | Status: AC
  Administered 2015-08-23: 17:00:00 via INTRAVENOUS
  Filled 2015-08-23: qty 1000

## 2015-08-23 MED ORDER — INSULIN ASPART 100 UNIT/ML ~~LOC~~ SOLN
SUBCUTANEOUS | Status: AC
  Filled 2015-08-23: qty 1

## 2015-08-23 MED ORDER — SODIUM CHLORIDE 0.9 % IV SOLN
250.0000 mL | Freq: Once | INTRAVENOUS | Status: AC
  Administered 2015-08-24: 250 mL via INTRAVENOUS

## 2015-08-23 MED ORDER — NOREPINEPHRINE BITARTRATE 1 MG/ML IV SOLN
0.0000 ug/min | INTRAVENOUS | Status: DC
  Administered 2015-08-23: 30 ug/min via INTRAVENOUS
  Filled 2015-08-23: qty 4

## 2015-08-23 MED FILL — Medication: Qty: 1 | Status: AC

## 2015-08-23 NOTE — ED Notes (Signed)
Family updated by Dr. Campos

## 2015-08-23 NOTE — ED Provider Notes (Signed)
CSN: [id number]     Arrival date & time 09/05/2015  1136 History   First MD Initiated Contact with Patient 09/06/2015 1151     Chief Complaint  Patient presents with  . Emesis  . Fatigue     (Consider location/radiation/quality/duration/timing/severity/associated sxs/prior Treatment) HPI Comments: Judy Green is a 49 y.o. female with a PMHx of ESRD on HD (Tu/Thurs/Sat) last dialyzed yesterday, hemorrhoids, diverticulosis, recurrent UTIs, adrenal insufficiency, HTN, R breast CA, and chronic hypotension, with a PSHx of failed renal transplantation, breast lumpectomy, and tubal ligation, who presents to the ED with complaints of generalized fatigue, lightheadedness with standing, chills, hypotension, ongoing diarrhea, and nausea and vomiting that began yesterday after dialysis. She reports that this morning she developed nausea and vomiting, having 2 episodes of nonbloody nonbilious emesis prior to arrival. She states that she feels the same way when she has a low blood pressure, and she called a friend over to check her blood pressure, she cannot recall what it was at that time, but she felt very lightheaded when she stood up and developed some mild 4/10 left-sided chest pain which is intermittent, nonradiating, dull, worse with movement or sitting up, with no treatments tried prior to arrival but improves when she lays down. She was unable to take any of her home medications this morning, including midodrine and fluticasone that she takes for her chronic hypotension. She also reports that when she sits up she feels like she can't catch her breath, but this resolved entirely when she lays flat, and she denies feeling short of breath. +OCP use.  She denies any fevers, shortness of breath, URI symptoms including rhinorrhea, ear pain or drainage, sore throat; denies cough, hemoptysis, leg swelling, recent travel/surgery/immobilization, history of PE/DVT, diaphoresis, abdominal pain, melena, hematochezia,  obstipation, constipation, rectal pain, vaginal bleeding or discharge, syncope, numbness, tingling, weakness, headache, vision changes, or use of blood thinners. She reports she does not produce urine due to her kidney failure. Her PCP is Dr. polite, and her nephrologist is Dr. Joelyn Oms  She also reports she's continued to have 2 episodes of daily watery nonbloody diarrhea since her discharge on 08/21/15.   Recently discharged on 08/21/15, hospitalized for abd pain and diarrhea, per discharge summary:  - CT abdomen and pelvis showing colitis involving cecum and terminal ileal area. - GI consult appreciated, colonoscopy 08/18/15, finding significant for ulcerated cecal mass, biopsies Significant for ischemic finding, no evidence of malignancy , CEA within normal limits, discussed with GI and surgery , at this point will hold on right colectomy pending further evaluation as an outpatient with repeat colonoscopy., even though colonoscopy findings suggestive of obstructive mass, patient did not have any nausea or vomiting , no symptoms of obstruction , has been tolerating soft diet with good bowel movement , (can be followed as an outpatient with plan to repeat colonoscopy in a few weeks to reassess the inflammatory mass by her primary gastroesophageal Dr. Edison Nasuti ,will monitor her course clinically . - C. difficile negative  Patient is a 50 y.o. female presenting with vomiting. The history is provided by the patient and medical records. No language interpreter was used.  Emesis Severity:  Moderate Duration:  1 day Timing:  Constant Number of daily episodes:  2x Quality:  Stomach contents Progression:  Unchanged Chronicity:  New Recent urination:  Absent (ESRD pt, doesn't produce urine) Relieved by:  None tried Worsened by:  Nothing tried Ineffective treatments:  None tried Associated symptoms: chills and diarrhea   Associated  symptoms: no abdominal pain, no arthralgias, no headaches, no myalgias, no sore  throat and no URI     Past Medical History  Diagnosis Date  . 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 Went back on HD Aug 2007 and remains on HD at [facility] on TTS schedule. Just got back on Tx list as of fall 2015.   Marland Kitchen Rectal bleeding 2007    hemorrhoids/diverticular disease  . Retroperitoneal hematoma 9/07    transfusion  . H/O parathyroidectomy 1999  . UTI (lower urinary tract infection)   . Adrenal insufficiency 2009  . Sleep apnea     does not have cpap at this time  . Oral contraceptive use     patient states has not had menstral cycle in 1 year.  . Hypertension     Dr. Seward Carol [phone]  . Chronic headaches   . H/O syncope   . Breast cancer     RT  . Anemia     hx of  . History of multiple failed HD accesses 09/19/2014    Has had multiple failed HD accesses, including but not limited to LUA AVF, RUA AVG, two LUA HeRO accesses, L thigh AVG, R thigh AVG and two RUA HeRO accesses.  As of fall 2015 her 2nd RUA HeRO occluded and attempt to salvage is pending. Access work is mainly done in [place] with [facility] also involved.    . Chronic hypotension 07/22/2012   Past Surgical History  Procedure Laterality Date  . Av fistula placement Right 05/08/2007  . Hero avg Left 12/14/2008  . Hero  04/17/2009    Removed clotted Left Hero  2) New Right Hero placed and Stent to SVC  . Mastectomy Left   . Exploration renal s/p transplantation    . Tubal ligation    . Avgg removal  08/07/2012    Procedure: REMOVAL OF ARTERIOVENOUS GORETEX GRAFT ([place]);  Surgeon: Rosetta Posner, MD;  Location: [facility];  Service: Vascular;  Laterality: Right;  . Right lower extremity vascular bypass    . Colonoscopy N/A 08/18/2015    Procedure: COLONOSCOPY;  Surgeon: Ladene Artist, MD;  Location: [facility] ENDOSCOPY;  Service: Endoscopy;  Laterality: N/A;   Family History  Problem Relation Age of Onset  . Diabetes Mother   .  Diabetes Maternal Aunt     x 3  . Colon cancer Maternal Uncle   . Heart disease Maternal Grandmother   . Diabetes Maternal Grandmother   . Stroke Maternal Grandmother   . Hypertension Brother   . Hypertension Brother   . Hypertension Brother   . Prostate cancer Maternal Uncle    Social History  Substance Use Topics  . Smoking status: Former Smoker    Quit date: 12/16/2002  . Smokeless tobacco: Never Used  . Alcohol Use: Yes     Comment: 2-3x/week   OB History    No data available     Review of Systems  Constitutional: Positive for chills and fatigue. Negative for fever and diaphoresis.  HENT: Negative for ear discharge, ear pain, rhinorrhea and sore throat.   Eyes: Negative for visual disturbance.  Respiratory: Negative for cough and shortness of breath.   Cardiovascular: Negative for chest pain and leg swelling.  Gastrointestinal: Positive for nausea, vomiting and diarrhea. Negative for abdominal pain, constipation, blood in stool and rectal pain.  Genitourinary: Negative for vaginal bleeding  and vaginal discharge.       Does not produce urine  Musculoskeletal: Negative for myalgias and arthralgias.  Skin: Negative for color change.  Allergic/Immunologic: Positive for immunocompromised state.  Neurological: Positive for light-headedness (with standing). Negative for syncope, weakness, numbness and headaches.  Hematological: Does not bruise/bleed easily.  Psychiatric/Behavioral: Negative for confusion.   10 Systems reviewed and are negative for acute change except as noted in the HPI.    Allergies  Review of patient's allergies indicates no known allergies.  Home Medications   Prior to Admission medications   Medication Sig Start Date End Date Taking? Authorizing Provider  aspirin EC 325 MG tablet Take 325 mg by mouth daily.    Historical Provider, MD  Calcium Acetate 667 MG TABS Take 1,334-2,001 mg by mouth 3 (three) times daily with meals. Take 2001 mg by mouth 3  times daily with meals. Take 1334 mg by mouth twice daily with snacks or deserts.    Historical Provider, MD  cyclobenzaprine (FLEXERIL) 5 MG tablet Take 1 tablet (5 mg total) by mouth 3 (three) times daily as needed for muscle spasms. Patient not taking: Reported on 11/23/2014 09/23/14   Delfina Redwood, MD  fludrocortisone (FLORINEF) 0.1 MG tablet Take 2 tablets (0.2 mg total) by mouth daily. 12/12/14   Renato Shin, MD  gabapentin (NEURONTIN) 100 MG capsule Take 100 mg by mouth at bedtime.  11/18/13   Historical Provider, MD  midodrine (PROAMATINE) 10 MG tablet Take 1 tablet (10 mg total) by mouth 3 (three) times daily. 09/06/12   Theodis Blaze, MD  norethindrone (MICRONOR,CAMILA,ERRIN) 0.35 MG tablet Take 1 tablet (0.35 mg total) by mouth daily. 11/23/14   Lahoma Crocker, MD  oxyCODONE-acetaminophen (PERCOCET/ROXICET) 5-325 MG per tablet Take 1 tablet by mouth every 6 (six) hours as needed for severe pain. 08/21/15   Silver Huguenin Elgergawy, MD  SENSIPAR 30 MG tablet Take 30 mg by mouth daily.  07/08/12   Historical Provider, MD   BP 76/32 mmHg  Temp(Src) 98.5 F (36.9 C) (Rectal)  Resp 26  SpO2 96% Physical Exam  Constitutional: She is oriented to person, place, and time. She appears well-developed and well-nourished.  Non-toxic appearance. She appears ill. She appears distressed.  Afebrile, nontoxic but ill appearing, appears uncomfortable, BP 50/30s, tachycardic in the 110s. SpO2 not detected due to pt having false nails in place. Actively vomiting  HENT:  Head: Normocephalic and atraumatic.  Mouth/Throat: Mucous membranes are dry.  Dry mucous membranes  Eyes: Conjunctivae and EOM are normal. Pupils are equal, round, and reactive to light. Right eye exhibits no discharge. Left eye exhibits no discharge.  Neck: Normal range of motion. Neck supple.  Cardiovascular: Regular rhythm, normal heart sounds and intact distal pulses.  Tachycardia present.  Exam reveals no gallop and no friction rub.     No murmur heard. Tachycardic in the 110s, reg rhythm, nl s1/s2, no m/r/g, distal pulses intact, no pedal edema   Pulmonary/Chest: Effort normal and breath sounds normal. No respiratory distress. She has no decreased breath sounds. She has no wheezes. She has no rhonchi. She has no rales. She exhibits tenderness. She exhibits no crepitus, no deformity and no retraction.    CTAB in all lung fields, no w/r/r, no increased WOB, speaking in full sentences, SpO2 not detected due to fake nails, will obtain improved site Mild anterior chest wall TTP, no crepitus or deformity, no retractions  Recheck: SpO2 96% on 2L via [place], SpO2 93% on RA (near  baseline)  Abdominal: Soft. Normal appearance and bowel sounds are normal. She exhibits no distension. There is tenderness in the left upper quadrant. There is no rigidity, no rebound, no guarding, no CVA tenderness, no tenderness at McBurney's point and negative Murphy's sign.    Soft, obese but nondistended, +BS throughout although somewhat hypoactive in LUQ, with LUQ TTP, no r/g/r, neg murphy's, neg mcburney's, no CVA TTP   Genitourinary: Guaiac negative stool.  FOBT neg  Musculoskeletal: Normal range of motion.  MAE x4 Strength and sensation grossly intact Distal pulses intact No pedal edema  Neurological: She is alert and oriented to person, place, and time. She has normal strength. No sensory deficit.  No focal neuro deficits  Skin: Skin is warm, dry and intact. No rash noted.  Psychiatric: She has a normal mood and affect.  Nursing note and vitals reviewed.   ED Course  Procedures (including critical care time) CRITICAL CARE Performed by: Corine Shelter   Total critical care time: 45 mins  Critical care time was exclusive of separately billable procedures and treating other patients.  Critical care was necessary to treat or prevent imminent or life-threatening deterioration.  Critical care was time spent personally by me  on the following activities: development of treatment plan with patient and/or surrogate as well as nursing, discussions with consultants, evaluation of patient's response to treatment, examination of patient, obtaining history from patient or surrogate, ordering and performing treatments and interventions, ordering and review of laboratory studies, ordering and review of radiographic studies, pulse oximetry and re-evaluation of patient's condition.   Labs Review Labs Reviewed  COMPREHENSIVE METABOLIC PANEL - Abnormal; Notable for the following:    Sodium 134 (*)    Chloride 94 (*)    CO2 19 (*)    Glucose, Bld 159 (*)    BUN 34 (*)    Creatinine, Ser 12.39 (*)    Calcium 8.5 (*)    GFR calc non Af Amer 3 (*)    GFR calc Af Amer 4 (*)    Anion gap 21 (*)    All other components within normal limits  CBC WITH DIFFERENTIAL/PLATELET - Abnormal; Notable for the following:    RBC 3.75 (*)    Hemoglobin 11.7 (*)    Neutrophils Relative % 81 (*)    Lymphocytes Relative 11 (*)    All other components within normal limits  LIPASE, BLOOD - Abnormal; Notable for the following:    Lipase 21 (*)    All other components within normal limits  TROPONIN I - Abnormal; Notable for the following:    Troponin I 0.21 (*)    All other components within normal limits  I-STAT CG4 LACTIC ACID, ED - Abnormal; Notable for the following:    Lactic Acid, Venous 4.02 (*)    All other components within normal limits  I-STAT TROPOININ, ED - Abnormal; Notable for the following:    Troponin i, poc 0.20 (*)    All other components within normal limits  CULTURE, BLOOD (ROUTINE X 2)  CULTURE, BLOOD (ROUTINE X 2)  POC OCCULT BLOOD, ED  I-STAT CG4 LACTIC ACID, ED    Imaging Review Dg Abd Acute W/chest  08/20/2015   CLINICAL DATA:  Chest pain and low upper quadrant abdominal pain. Vomiting.  EXAM: DG ABDOMEN ACUTE W/ 1V CHEST  COMPARISON:  Radiographs dated 12/14/2013  FINDINGS: Central venous dialysis catheters are  seen from below in from above with the tips in the right atrium. Heart size  and pulmonary vascularity are normal. Minimal scarring at the left lung base. No effusions. No free air or dilated loops of large or small bowel. Calcified atrophic right pelvic renal transplant. No acute osseous abnormality.  IMPRESSION: Benign appearing abdomen and chest.  No acute abnormalities.   Electronically Signed   By: Lorriane Shire M.D.   On: 09/08/2015 13:43   I have personally reviewed and evaluated these images and lab results as part of my medical decision-making.   EKG Interpretation   Date/Time:  Wednesday August 23 2015 11:48:51 EDT Ventricular Rate:  108 PR Interval:  152 QRS Duration: 103 QT Interval:  382 QTC Calculation: 512 R Axis:   42 Text Interpretation:  Sinus tachycardia Low voltage, precordial leads  Abnormal R-wave progression, early transition Borderline T abnormalities,  diffuse leads Prolonged QT interval No significant change was found  Confirmed by CAMPOS  MD, Lennette Bihari ([id number]) on 09/02/2015 1:02:54 PM      MDM   Final diagnoses:  Hypotension, unspecified hypotension type  Non-intractable vomiting with nausea, vomiting of unspecified type  Elevated troponin  Lactic acidosis  ESRD (end stage renal disease)  Diarrhea  LUQ abdominal tenderness  Orthostatic lightheadedness  Other fatigue  Chest pain, unspecified chest pain type    49 y.o. female here with fatigue, orthostatic lightheadedness, mild intermittent CP with position changes, chills, n/v, and hypotension since she had dialysis yesterday. Was unable to take her midodrine or any other meds this morning due to n/v. She was just discharged on 08/21/15 from the hospital, admitted for abd pain/diarrhea, colonoscopy with mass, ulcerative changes of cecum c/w ischemic colitis, was supposed to f/up for repeat colonoscopy at later date to discuss further options. Still having ongoing diarrhea. On exam, pt with BPs in  50-60s/30-40s, tachycardic in the 110s, SpO2 unable to read due to pt's nails but will attempt to get improved monitoring. Denies SOB but states she feels like she can't catch her breath when she sits up. PE on the differential, but given pt's chronic illness, would be unable to do CTA, would need to be VQ study if ongoing CP/SOB persisting. Appears ill. Some mild LUQ TTP on exam, with mildly hypoactive bowel sounds in this region, concerning for possible obstruction. Will obtain rectal temp, EKG, lactate, blood cultures, basic labs, FOBT, trop, and acute abd series. Will give zofran now for nausea due to issues with obtaining IV access. Will also give fluids at 30cc/kg/hr. Will reassess shortly.   1:20 PM Lactic 4.02, unclear source but will start vanc/zosyn. Trop 0.2, likely from demand vs poor renal excretion, but no ischemic changes on EKG. CBC WNL. FOBT neg. Rectal temp WNL. CMP and lipase pending. Acute abd series still pending. BP slightly improved, minimally. Will continue to give fluids.  2:20 PM BP improving with fluids, now 90/79. CMP with elevated BUN/Cr near baseline, anion gap likely from lactic acidosis. Formal trop I 0.21. Lipase WNL. Acute abd series without acute findings. Will proceed with admission. Pt resting comfortably.  2:52 PM Imogene Burn of [facility] returning page, will admit to stepdown. Please see her notes for further documentation of care. Of note, last BP reading states 67/55 but upon reassessment pt with SBPs in the 90s.   BP 67/55 mmHg  Temp(Src) 98.5 F (36.9 C) (Rectal)  Resp 24  SpO2 96%  Meds ordered this encounter  Medications  . sodium chloride 0.9 % bolus 1,000 mL    Sig:     Order Specific Question:  Enter Patient  Weight in Kilograms    Answer:  100  . ondansetron (ZOFRAN-ODT) disintegrating tablet 8 mg    Sig:   . piperacillin-tazobactam (ZOSYN) IVPB 3.375 g    Sig:     Order Specific Question:  Antibiotic Indication:    Answer:  Sepsis  .  vancomycin (VANCOCIN) 2,000 mg in sodium chloride 0.9 % 500 mL IVPB    Sig:     Order Specific Question:  Indication:    Answer:  Sepsis  . piperacillin-tazobactam (ZOSYN) IVPB 2.25 g    Sig:     Order Specific Question:  Antibiotic Indication:    Answer:  Sepsis  . vancomycin (VANCOCIN) IVPB 1000 mg/200 mL premix    Sig:     Order Specific Question:  Indication:    Answer:  Sepsis  . promethazine (PHENERGAN) injection 25 mg    Sig:      Daphine Loch Camprubi-Soms, PA-C 09/08/2015 1453  ADDENDUM: Family member screaming out of room for help, nursing staff reporting she was having a possible seizure. [facility] hospitalist asking me to call critical care, who returned the page now (3:27pm) and stated they would come see her. Please see their notes for further documentation of care.   BP 85/56 mmHg  Temp(Src) 98.5 F (36.9 C) (Rectal)  Resp 29  SpO2 96%   Tippi Mccrae Camprubi-Soms, PA-C 09/04/2015 [place], MD 08/21/2015 1742

## 2015-08-23 NOTE — Progress Notes (Signed)
PCCM Interval Progress Note  Extensive discussion with multiple family members including pt's husband, son, daughter, mother regarding current circumstances of Judy Green.  I explained that she has bilateral PE with subsequent PEA cardiac arrest and that she is currently on anticoagulation with heparin gtt.    Given that she in fact arrested, the risks and benefits of systemic tPA were discussed especially in light of recent colonoscopy with biopsy of polyp 5 days ago.  Dr. Chase Caller had a conversation with Dr. Henrene Pastor of GI who felt that at this point, there was zero risk of bleeding from the biopsy site.  He did however mention that there is a relative risk of bleeding due to her known ischemic colitis.  Given that she is already hypotensive despite 17mcg Levophed, her ischemic colitis could worsen to potentially cause catastrophic intraabdominal ischemia.  The above was all relayed to family who understood the risk of bleeding involved with administration of systemic tPA.  Given her ischemic colitis and poor renal function, we discussed using 1/2 dose tPA to which family was in favor.  They have discussed it amongst themselves (very large family) and have decided to move forward with systemic tPA at 1/2 dose.  They are aware of risk of bleeding and are willing to accept this risk.  All questions have been answered.   Judy Green, [facility] Pager: [phone]  or [phone] 09/12/2015, 10:17 PM

## 2015-08-23 NOTE — H&P (Signed)
PULMONARY / CRITICAL CARE MEDICINE   Name: Judy Green MRN: [id number] DOB: September 27, 1966    ADMISSION DATE:  09/15/2015 CONSULTATION DATE:  08/22/2015  REFERRING MD :  Dr. Venora Maples   CHIEF COMPLAINT:  SOB   INITIAL PRESENTATION: 49 y/o F with PMH of ESRD on HD, adrenal insufficiency on florinef who presented to [facility] on 9/7 with complaints of fatigue, weakness and SOB.  The patient was initially hypotensive (BP's in 60's) that responded to IVF with an elevated lactic acid (4).  She subsequently suffered a PEA cardiac arrest (approx 20 minutes of intermittent CPR).  Of note, she was discharged on 9/05 after an admission for ischemic colitis.    STUDIES:  9/07  CTA Chest >>  9/07  CT ABD/Pelvis >> 9/07  CT Head >>   SIGNIFICANT EVENTS: 8/30 -9/05  Admit for ischemic colitis, cecal ulcerative mass 9/07  Admit with c/o SOB, fatigue.  PEA arrest in ER.     HISTORY OF PRESENT ILLNESS:  49 y/o F with PMH of ESRD on HD, failed renal transplant, multiple failed HD accesses, adrenal insufficiency on florinef, OSA, chronic headaches, anemia of chronic disease, and on oral contraceptives who presented to [facility] on 9/7 with complaints of fatigue, weakness, vomiting clear liquid and SOB.    She was recently admitted from 8/30 - 9/5 for lower abdominal pain and diarrhea.  She was found to have colitis involving the cecum and terminal ileal area in the setting of an ulcerated cecal mass.  She underwent a colonoscopy 9/2 which was consistent for ischemic findings, no evidence of malignancy.  She was planned to follow up in the office for repeat colonoscopy (Dr. Ardis Hughs).  During that admission, she had a thrombectomy of her HERO graft per IR on 9/2 and remains non-functional.  A L femoral perm cath was placed by IR on 9/3.    Initially, the patient was hypotensive with BP's in 60's that responded to IVF with an elevated lactic acid (4).  Initial lab work notable for Na 134, K 4.5, CO2 19, sr Cr 12.39, glucose  159, troponin 0.21, WBC 9.5, Hgb 11.7, & platelets 305.  The patient was worked up for possible sepsis and was pending admission to SDU.  She asked her family to sit up and then began to have a fixed stare, later she became unresponsive.  There was question of her having a seizure during the event as she appears to have bit her tongue.  The patient was identified to have a PEA cardiac arrest with approximately 20 minutes of intermittent CPR.  After return of circulation, she was stabilized on levophed and bicarbonate gtt.  PCCM called for ICU admission.    PAST MEDICAL HISTORY :   has a past medical history of ESRD (end stage renal disease); Rectal bleeding (2007); Retroperitoneal hematoma (9/07); H/O parathyroidectomy (1999); UTI (lower urinary tract infection); Adrenal insufficiency (2009); Sleep apnea; Oral contraceptive use; Hypertension; Chronic headaches; H/O syncope; Breast cancer; Anemia; History of multiple failed HD accesses (09/19/2014); and Chronic hypotension (07/22/2012).  has past surgical history that includes AV fistula placement (Right, 05/08/2007); Hero AVG (Left, 12/14/2008); Hero (04/17/2009); Mastectomy (Left); Exploration renal s/p transplantation; Tubal ligation; Arteriovenous goretex graft removal (08/07/2012); right lower extremity vascular bypass; and Colonoscopy (N/A, 08/18/2015).   Prior to Admission medications   Medication Sig Start Date End Date Taking? Authorizing Provider  aspirin EC 325 MG tablet Take 325 mg by mouth daily.   Yes Historical Provider, MD  Calcium Acetate  667 MG TABS Take 1,334-2,001 mg by mouth 3 (three) times daily with meals. Take 2001 mg by mouth 3 times daily with meals. Take 1334 mg by mouth twice daily with snacks or deserts.   Yes Historical Provider, MD  fludrocortisone (FLORINEF) 0.1 MG tablet Take 2 tablets (0.2 mg total) by mouth daily. 12/12/14  Yes Renato Shin, MD  gabapentin (NEURONTIN) 100 MG capsule Take 100 mg by mouth at bedtime.  11/18/13  Yes  Historical Provider, MD  midodrine (PROAMATINE) 10 MG tablet Take 1 tablet (10 mg total) by mouth 3 (three) times daily. 09/06/12  Yes Theodis Blaze, MD  norethindrone (MICRONOR,CAMILA,ERRIN) 0.35 MG tablet Take 1 tablet (0.35 mg total) by mouth daily. 11/23/14  Yes Lahoma Crocker, MD  oxyCODONE-acetaminophen (PERCOCET/ROXICET) 5-325 MG per tablet Take 1 tablet by mouth every 6 (six) hours as needed for severe pain. 08/21/15  Yes Albertine Patricia, MD  SENSIPAR 30 MG tablet Take 30 mg by mouth daily.  07/08/12  Yes Historical Provider, MD   No Known Allergies  FAMILY HISTORY:  indicated that her mother is alive. She indicated that her father is alive. She indicated that all of her three brothers are alive.    SOCIAL HISTORY:  reports that she quit smoking about 12 years ago. She has never used smokeless tobacco. She reports that she drinks alcohol. She reports that she does not use illicit drugs.  REVIEW OF SYSTEMS:  Unable to complete with patient due to intubation / MV, post arrest  SUBJECTIVE:   VITAL SIGNS: Temp:  [98.5 F (36.9 C)] 98.5 F (36.9 C) (09/07 1205) Resp:  [18-30] 29 (09/07 1455) BP: (58-136)/(19-87) 85/56 mmHg (09/07 1455) SpO2:  [92 %-96 %] 96 % (09/07 1205)   HEMODYNAMICS:     VENTILATOR SETTINGS:     INTAKE / OUTPUT:  Intake/Output Summary (Last 24 hours) at 09/07/2015 1627 Last data filed at 08/31/2015 1415  Gross per 24 hour  Intake   1050 ml  Output      0 ml  Net   1050 ml    PHYSICAL EXAMINATION: General:  Chronically ill appearing female on vent Neuro:  Altered on mechanical ventilation HEENT:  OETT, MM pale, bite mark on tongue Cardiovascular:  s1s2 rrr, ST on monitor Lungs:  resp's even/non-labored, lungs bilaterally coarse Abdomen:  Obese/soft, R abd scar from prior transplant Musculoskeletal:  No acute deformities  Skin:  Warm/dry, no edema, multiple prior access sites / old grafts  LABS:  CBC  Recent Labs Lab 08/18/15 0518  08/21/15 0459 08/22/2015 1125 09/11/2015 1610  WBC 7.3 6.8 9.5  --   HGB 10.2* 10.4* 11.7* 14.6  HCT 32.2* 32.6* 36.6 43.0  PLT 252 237 305  --    Coag's  Recent Labs Lab 08/17/15 1120  INR 1.45   BMET  Recent Labs Lab 08/18/15 0518 08/21/15 0459 08/21/2015 1125 08/31/2015 1610  NA 138 133* 134* 135  K 4.0 3.9 4.5 7.4*  CL 97* 93* 94* 106  CO2 22 25 19*  --   BUN 40* 33* 34* 52*  CREATININE 15.20* 13.40* 12.39* 12.40*  GLUCOSE 88 96 159* 146*   Electrolytes  Recent Labs Lab 08/18/15 0518 08/21/15 0459 09/06/2015 1125  CALCIUM 8.2* 8.7* 8.5*  PHOS  --  6.8*  --    Sepsis Markers  Recent Labs Lab 08/29/2015 1256 08/17/2015 1552  LATICACIDVEN 4.02* 8.87*   ABG No results for input(s): PHART, PCO2ART, PO2ART in the last 168 hours.  Liver Enzymes  Recent Labs Lab 08/21/15 0459 09/06/2015 1125  AST  --  36  ALT  --  18  ALKPHOS  --  85  BILITOT  --  0.7  ALBUMIN 3.3* 3.7   Cardiac Enzymes  Recent Labs Lab 09/01/2015 1135  TROPONINI 0.21*   Glucose  Recent Labs Lab 08/20/15 0742 08/20/15 1143 08/20/15 1649 08/20/15 2108 08/21/15 0859 09/08/2015 1526  GLUCAP 88 100* 142* 123* 121* 112*    Imaging Dg Abd Acute W/chest  09/14/2015   CLINICAL DATA:  Chest pain and low upper quadrant abdominal pain. Vomiting.  EXAM: DG ABDOMEN ACUTE W/ 1V CHEST  COMPARISON:  Radiographs dated 12/14/2013  FINDINGS: Central venous dialysis catheters are seen from below in from above with the tips in the right atrium. Heart size and pulmonary vascularity are normal. Minimal scarring at the left lung base. No effusions. No free air or dilated loops of large or small bowel. Calcified atrophic right pelvic renal transplant. No acute osseous abnormality.  IMPRESSION: Benign appearing abdomen and chest.  No acute abnormalities.   Electronically Signed   By: Lorriane Shire M.D.   On: 08/29/2015 13:43     ASSESSMENT / PLAN:  PULMONARY OETT 9/7 >>  A: Acute Respiratory Failure -  in the setting of PEA arrest  R/O PE P:   MV support, 8cc/kg Increase rate in setting of acidosis Follow up ABG in one hour Trend CXR  PRN albuterol for wheezing CTA Chest to rule out PE  Assess D-Dimer  CARDIOVASCULAR A:  PEA Arrest  Chronic Hypotension - on midodrine, florinef P:  ICU admit / monitoring Levophed for MAP > 60 Stress dose steroids Hold midodrine  Gentle volume resuscitation with ESRD Not a cooling candidate due to recent GIB  RENAL L Femoral Perm Cath 9/3 >>  A:   ESRD on HD (T,Th,S) - hx failed renal transplant with multiple grafts AG Metabolic Acidosis  Suspected End Stage Access - multiple access with prior vascular involvement  ? Hyperkalemia - thought to be erroneous reading P:   HD per Nephrology, appreciate input Will need to follow up on HERO graft once stabilized Trend BMP  Repeat BMP, ABG, lactic acid in one hour Replace electrolytes as indicated  Bicarb gtt at 172m/hr, pending ABG review, may need to adjust rate  GASTROINTESTINAL A:   Hx Ulcerated Cecal Mass s/p biopsy c/w ischemic colitis - completed 9/2 R/O Abdominal Source Cardiac Arrest  / abdominal catastrophe  P:   Assess CT Abd/Pelvis  PPI QD for SUP  HEMATOLOGIC A:   Anemia of Chronic Disease  P:  Trend CBC Transfuse per ICU guidelines  INFECTIOUS A:   Rule Out Sepsis  P:   BCx2 9/7 >> Sputum 9/7 >>   Vanco, start date 9/7, day 1/x Zosyn, start date 9/7, day 1/x  Trend WBC / Fever curve / PCT   ENDOCRINE A:   Adrenal Insufficiency - on midodrine, florinef at baseline P:   Stress steroids as above Monitor glucose on BMP  NEUROLOGIC A:   Acute Encephalopathy - in setting of PEA arrest  Chronic Pain - on narcotics at baseline P:   RASS goal: -1 PRN versed / fentanyl  Assess CT head to r/o neurologic event    FAMILY  - Updates: Family updated by Dr. Chase Caller.  Family requests full code.    - Inter-disciplinary family meet or Palliative Care meeting  due by: 9/14    Noe Gens, NP-C [facility] Pgr: [phone] or if no answer [phone] 08/20/2015, 4:27 PM

## 2015-08-23 NOTE — Code Documentation (Signed)
Pt pulseless, CPR resumed.

## 2015-08-23 NOTE — Code Documentation (Signed)
Pt pulseless, CPR started 

## 2015-08-23 NOTE — Progress Notes (Signed)
ANTIBIOTIC CONSULT NOTE - INITIAL  Pharmacy Consult for vancomycin + zosyn Indication: rule out sepsis  No Known Allergies  Patient Measurements:   Adjusted Body Weight:   Vital Signs: Temp: 98.5 F (36.9 C) (09/07 1205) Temp Source: Rectal (09/07 1205) BP: 76/32 mmHg (09/07 1217) Intake/Output from previous day:   Intake/Output from this shift:    Labs:  Recent Labs  08/21/15 0459 09/02/2015 1125  WBC 6.8 9.5  HGB 10.4* 11.7*  PLT 237 305  CREATININE 13.40*  --    Estimated Creatinine Clearance: 6.3 mL/min (by C-G formula based on Cr of 13.4). No results for input(s): VANCOTROUGH, VANCOPEAK, VANCORANDOM, GENTTROUGH, GENTPEAK, GENTRANDOM, TOBRATROUGH, TOBRAPEAK, TOBRARND, AMIKACINPEAK, AMIKACINTROU, AMIKACIN in the last 72 hours.   Microbiology: Recent Results (from the past 720 hour(s))  C difficile quick scan w PCR reflex     Status: None   Collection Time: 08/15/15 11:14 PM  Result Value Ref Range Status   C Diff antigen NEGATIVE NEGATIVE Final   C Diff toxin NEGATIVE NEGATIVE Final   C Diff interpretation Negative for toxigenic C. difficile  Final    Medical History: Past Medical History  Diagnosis Date  . 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 Went back on HD Aug 2007 and remains on HD at [facility] on TTS schedule. Just got back on Tx list as of fall 2015.   Marland Kitchen Rectal bleeding 2007    hemorrhoids/diverticular disease  . Retroperitoneal hematoma 9/07    transfusion  . H/O parathyroidectomy 1999  . UTI (lower urinary tract infection)   . Adrenal insufficiency 2009  . Sleep apnea     does not have cpap at this time  . Oral contraceptive use     patient states has not had menstral cycle in 1 year.  . Hypertension     Dr. Seward Carol [phone]  . Chronic headaches   . H/O syncope   . Breast cancer     RT  . Anemia     hx of  . History of multiple failed HD accesses 09/19/2014     Has had multiple failed HD accesses, including but not limited to LUA AVF, RUA AVG, two LUA HeRO accesses, L thigh AVG, R thigh AVG and two RUA HeRO accesses.  As of fall 2015 her 2nd RUA HeRO occluded and attempt to salvage is pending. Access work is mainly done in [place] with [facility] also involved.    . Chronic hypotension 07/22/2012    Medications:  Anti-infectives    Start     Dose/Rate Route Frequency Ordered Stop   09/14/15 1200  vancomycin (VANCOCIN) IVPB 1000 mg/200 mL premix     1,000 mg 200 mL/hr over 60 Minutes Intravenous Every T-Th-Sa (Hemodialysis) 08/21/2015 1311     08/21/2015 2200  piperacillin-tazobactam (ZOSYN) IVPB 2.25 g     2.25 g 100 mL/hr over 30 Minutes Intravenous 3 times per day 08/28/2015 1311     08/22/2015 1315  piperacillin-tazobactam (ZOSYN) IVPB 3.375 g     3.375 g 100 mL/hr over 30 Minutes Intravenous  Once 08/30/2015 1305     09/05/2015 1315  vancomycin (VANCOCIN) 2,000 mg in sodium chloride 0.9 % 500 mL IVPB     2,000 mg 250 mL/hr over 120 Minutes Intravenous  Once 08/29/2015 1305       Assessment: 85 yomf presented today with fatigue. To start empiric vancomycin +  zosyn for possible sepsis. Pt is afebrile and WBC is WNL. Lactic acid is elevated. Pt is on HD TTS.  Vanc 9/7>> Zosyn 9/7>>  Goal of Therapy:  Vancomycin trough level 15-20 mcg/ml  Plan:  - Zosyn 3.375gm IV x 1 then 2.25gm IV Q8H - Vanc 2gm IV x 1 then 1gm post-HD - F/u renal fxn, C&S, clinical status and pre-HD level when appropriate  Nakiesha Rumsey, Rande Lawman 08/21/2015,1:11 PM

## 2015-08-23 NOTE — Code Documentation (Signed)
Pulses present at this time.  

## 2015-08-23 NOTE — Procedures (Signed)
Arterial Catheter Insertion Procedure Note BOBIE CARIS [id number] August 25, 1966  Procedure: Insertion of Arterial Catheter  Indications: Blood pressure monitoring  Procedure Details Consent: Unable to obtain consent because of emergent medical necessity. Time Out: Verified patient identification, verified procedure, site/side was marked, verified correct patient position, special equipment/implants available, medications/allergies/relevent history reviewed, required imaging and test results available.  Performed  Maximum sterile technique was used including antiseptics, cap, gloves, gown, hand hygiene, mask and sheet. Skin prep: Chlorhexidine; local anesthetic administered 20 gauge catheter was inserted into right femoral artery using the Seldinger technique.  Evaluation Blood flow good; BP tracing good. Complications: No apparent complications.   Georgann Housekeeper, AGACNP-BC [facility] Pulmonology/Critical Care Pager [phone] or [phone]  08/26/2015 5:20 PM

## 2015-08-23 NOTE — Progress Notes (Signed)
GI ATTENDING  I was contacted by Dr. Chase Caller regarding this patient. She is critically ill and they (CCM) were interested in using thrombolytic agents. The patient had a colonoscopy with Dr. Fuller Plan 08/18/2015. Patient has ischemic colitis involving the cecum and ascending colon. Biopsy proven. The question posed was regarding the risk of bleeding related to colonoscopy biopsies. At this point, zero risk of bleeding from colonic biopsies. However, she could bleed from known ischemic colitis. Furthermore, ischemic changes could considerably worsen in the face of current hypotension requiring pressor agents. In my opinion, there is no absolute contraindication to thrombolytics, but rather relative, particularly given the nature of her illness. GI is available as needed. Thank you  Docia Chuck. Geri Seminole., M.D. [facility] Division of Gastroenterology

## 2015-08-23 NOTE — Code Documentation (Signed)
Pulses present  

## 2015-08-23 NOTE — Progress Notes (Signed)
Patient transported to room 8C88 without complications.  Will continue to monitor.

## 2015-08-23 NOTE — Progress Notes (Signed)
Chaplain met with family in consult room as per chaplain request for follow up.  Pt being transported to [facility] ICU, chaplain moved family members from consult room to [facility] waiting and informed [facility] staff of family's location.  Chaplain provided emotional support through hospitality to family and is available to follow up as needed.  Mertie Moores, Chaplain 09/08/2015 9:01 PM

## 2015-08-23 NOTE — ED Notes (Signed)
Attempted report 

## 2015-08-23 NOTE — Code Documentation (Signed)
PEA rhythm Compressions continued

## 2015-08-23 NOTE — Progress Notes (Addendum)
ANTICOAGULATION CONSULT NOTE - Initial Consult  Pharmacy Consult for Heparin Indication: pulmonary embolus  No Known Allergies  Patient Measurements:   Heparin Dosing Weight: 87 kg  Vital Signs: Temp: 98.5 F (36.9 C) (09/07 1205) Temp Source: Rectal (09/07 1205) BP: 94/18 mmHg (09/07 1703)  Labs:  Recent Labs  08/21/15 0459 08/18/2015 1125 08/31/2015 1135 08/17/2015 1610 08/28/2015 1730 09/09/2015 1745  HGB 10.4* 11.7*  --  14.6 8.4*  --   HCT 32.6* 36.6  --  43.0 27.2*  --   PLT 237 305  --   --  187  --   LABPROT  --   --   --   --  19.7*  --   INR  --   --   --   --  1.66*  --   CREATININE 13.40* 12.39*  --  12.40* 8.85* 8.73*  TROPONINI  --   --  0.21*  --  0.25*  --     Estimated Creatinine Clearance: 9.7 mL/min (by C-G formula based on Cr of 8.73).   Medical History: Past Medical History  Diagnosis Date  . 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 Went back on HD Aug 2007 and remains on HD at [facility] on TTS schedule. Just got back on Tx list as of fall 2015.   Marland Kitchen Rectal bleeding 2007    hemorrhoids/diverticular disease  . Retroperitoneal hematoma 9/07    transfusion  . H/O parathyroidectomy 1999  . UTI (lower urinary tract infection)   . Adrenal insufficiency 2009  . Sleep apnea     does not have cpap at this time  . Oral contraceptive use     patient states has not had menstral cycle in 1 year.  . Hypertension     Dr. Seward Carol [phone]  . Chronic headaches   . H/O syncope   . Breast cancer     RT  . Anemia     hx of  . History of multiple failed HD accesses 09/19/2014    Has had multiple failed HD accesses, including but not limited to LUA AVF, RUA AVG, two LUA HeRO accesses, L thigh AVG, R thigh AVG and two RUA HeRO accesses.  As of fall 2015 her 2nd RUA HeRO occluded and attempt to salvage is pending. Access work is mainly done in [place] with [facility] also involved.     . Chronic hypotension 07/22/2012    Medications:  Prescriptions prior to admission  Medication Sig Dispense Refill Last Dose  . aspirin EC 325 MG tablet Take 325 mg by mouth daily.   Unk  . Calcium Acetate 667 MG TABS Take 1,334-2,001 mg by mouth 3 (three) times daily with meals. Take 2001 mg by mouth 3 times daily with meals. Take 1334 mg by mouth twice daily with snacks or deserts.   Unk  . fludrocortisone (FLORINEF) 0.1 MG tablet Take 2 tablets (0.2 mg total) by mouth daily. 60 tablet 0 Unk  . gabapentin (NEURONTIN) 100 MG capsule Take 100 mg by mouth at bedtime.    Unk  . midodrine (PROAMATINE) 10 MG tablet Take 1 tablet (10 mg total) by mouth 3 (three) times daily. 30 tablet 1 Unk  . norethindrone (MICRONOR,CAMILA,ERRIN) 0.35 MG tablet Take 1 tablet (0.35 mg total) by mouth daily. 1 Package 5 Unk  . oxyCODONE-acetaminophen (PERCOCET/ROXICET) 5-325 MG per tablet Take 1 tablet by mouth  every 6 (six) hours as needed for severe pain. 15 tablet 0 Unk  . SENSIPAR 30 MG tablet Take 30 mg by mouth daily.    Unk   Scheduled:  . etomidate      . hydrocortisone sod succinate (SOLU-CORTEF) inj  50 mg Intravenous Q6H  . lidocaine (cardiac) 100 mg/76ml      . pantoprazole (PROTONIX) IV  40 mg Intravenous QHS  . piperacillin-tazobactam (ZOSYN)  IV  2.25 g Intravenous 3 times per day  . rocuronium      . sodium chloride  1,000 mL Intravenous Once  . succinylcholine      . [START ON 2015/09/03] vancomycin  1,000 mg Intravenous Q T,Th,Sa-HD   Infusions:  . norepinephrine (LEVOPHED) Adult infusion    .  sodium bicarbonate  infusion 1000 mL      Assessment: 49yo female presented today with fatigue and was started on IV antibiotics for suspected sepsis. Pharmacy is consulted to dose heparin for PE. Hgb 8.4, Plt 187, sCr 8.73, Trop 0.25.  Goal of Therapy:  Heparin level 0.3-0.7 units/ml aPTT 66-102 seconds Monitor platelets by anticoagulation protocol: Yes   Plan:  Give 5000 units bolus x  1 Start heparin infusion at 1500 units/hr Check anti-Xa level in 6 hours and daily while on heparin Continue to monitor H&H and platelets  Andrey Cota. Diona Foley, PharmD Clinical Pharmacist Pager [phone] 09/13/2015,6:56 PM  ADDN: Pharmacy is consulted to start heparin without bolus when aPTT < 80 based on decision to give tPA for PE. Pt already received 5000 unit bolus of heparin and was started at 1500 units/hr. Nurse reports that heparin infusion was turned off at approximately 2230.  Plan: Start heparin without bolus when aPTT < 80 sec  Andrey Cota. Diona Foley, PharmD Clinical Pharmacist Pager [phone]

## 2015-08-23 NOTE — Code Documentation (Signed)
Pt had seizure type activity.  Snoring resp.  Heart rate in 40's - 50's.  Pt moved to Trauma B, pt unresponsive, pt being bagged at this time, MD preparing to intubate.

## 2015-08-23 NOTE — ED Notes (Addendum)
Elevated troponin 0.2 communicated to [place], PA-C and Dr. Venora Maples.

## 2015-08-23 NOTE — Progress Notes (Addendum)
Back at bedside based on CTA report showing Bilateral massive PE  Severa hours ago asked RN based on report to dc a line  I d./w Dr Henrene Pastor of GI who reviewed chart - there is minor risk for bleeding ifrom her ischemic colitis but his thoughts were to weigh on risk v benfit ratio  Currently SBP 78 on MAX Levophed and in Refractory shock   Myself and APP reviewed Indications and Exlcusions for lytic Rx  - she meets indications for systemic lysis due to CPR and ongoing pressor need and shock     Recent Labs Lab 09/04/2015 1256 09/06/2015 1552 09/15/2015 1730  LATICACIDVEN 4.02* 8.87* 7.8*  PROCALCITON  --   --  1.15      Recent Labs Lab 08/22/2015 1135 09/13/2015 1730  TROPONINI 0.21* 0.25*      - no absolute CI    - However has relative CI   - rib fracture and > 10 min CPR +    - ischemic colitis - poses some risk for bleeding   - ESRD - theoretical risk for functional thromboyctopenia   These were explained to family by APP Mr Shearon Stalls and myself. Given fact patient very likely to die from her PE (persistent lactic acidosis and in refractory acidosis) that benefit outweight risk and bleeding exlcusion are not absolute on check list but relative will give systemic lysis at 50% dose of alteplase  Alteplase is not a blood prdt per pharm. Family say spatient is JEHOVAH WITNESS - no blood pdts if she bleeds but they accept risk  Full code - again declared by daughter, son and church member   Additional 45 min ccm time   Dr. Brand Males, M.D., [facility]C.P Pulmonary and Critical Care Medicine Staff Physician [facility] Pager: [phone], If no answer or between  15:00h - 7:00h: call [phone]  08/28/2015 10:15 PM

## 2015-08-23 NOTE — ED Notes (Signed)
Pt arrives via EMS from home with generalized fatigue not feeling well since yesterdays dialysis treatment. Pt reports 2 episodes of vomiting clear fluid this AM. Pt reports she feels like she cant catch her breath.

## 2015-08-23 NOTE — Progress Notes (Signed)
[place] Progress Note Patient Name: Judy Green DOB: Apr 06, 1966 MRN: [id number]   Date of Service  09/13/2015  HPI/Events of Note  Patient noted to have short episode of foaming at the mouth.  Bite block in place. Atypcial seizure with abnormal breathing pattern.   eICU Interventions  Versed per PAD protocol given. Patient observed, no further foaming. MAP noted to be low, will start vasopressin. Awaiting systemic lytics for massive PE     Intervention Category Major Interventions: Seizures - evaluation and management  Gilma Bessette 08/22/2015, 11:26 PM

## 2015-08-23 NOTE — Code Documentation (Signed)
Rhythm remains PEA, compressions continued

## 2015-08-23 NOTE — Progress Notes (Signed)
   09/06/2015 1610  Clinical Encounter Type  Visited With Family;Health care provider  Visit Type Initial;Code  Referral From Nurse;Care management   Chaplains responded to code trauma in ED. Chaplains offered water and support to family members. Chaplain support available as needed. Pager -Galliano, Chaplain 09/13/2015 4:12 PM

## 2015-08-23 NOTE — Consult Note (Signed)
Renal Service Consult Note [facility]  Judy Green 09/10/2015 [place] Requesting Physician:  Dr Chase Caller  Reason for Consult:  ESRD p\t with shock/ cardiac arrest in ED HPI: The patient is a 49 y.o. year-old with hx of ESRD, multiple access failures, parathyroidectomy, adrenal insufficiency, breast Ca in past, chronic hypotension.  Patient presented with abd pain and SOB to ED. Was not feeling well since dialysis yesterday.  Emesis x 2 this am. In ED BP's were low 59/36.  While in ED patient coded with PEA arrest, now intubated and resuscitated.  BP's 70's now. Patient unresponsive.   Here in Oct 2015 with malfunctioning HD catheter, hx failed renal Tx and chronic hypotension, occluded R arm HeRO access, R groin hematoma and ESRD on HD.   She was last here last week from 8/30 to 9/5 with low abd pain, and diarrhea w n/v. CT showed colitis involving term ileum and the cecum. Biopsies showed ischemic findings, no malignancy. CEA was normal. Gen surg said to follow conservatively. Colonscopy reportedly showed "obstructive mass" but clinically the patinet was doing well and having BM"s so no further w/u was done. Had clotted HeRO access which she wanted to address with her access team at [facility] so this was not addressed while here.   Past Medical History  Past Medical History  Diagnosis Date  . 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 Went back on HD Aug 2007 and remains on HD at [facility] on TTS schedule. Just got back on Tx list as of fall 2015.   Marland Kitchen Rectal bleeding 2007    hemorrhoids/diverticular disease  . Retroperitoneal hematoma 9/07    transfusion  . H/O parathyroidectomy 1999  . UTI (lower urinary tract infection)   . Adrenal insufficiency 2009  . Sleep apnea     does not have cpap at this time  . Oral contraceptive use     patient states has not had menstral cycle in 1 year.  .  Hypertension     Dr. Seward Carol [phone]  . Chronic headaches   . H/O syncope   . Breast cancer     RT  . Anemia     hx of  . History of multiple failed HD accesses 09/19/2014    Has had multiple failed HD accesses, including but not limited to LUA AVF, RUA AVG, two LUA HeRO accesses, L thigh AVG, R thigh AVG and two RUA HeRO accesses.  As of fall 2015 her 2nd RUA HeRO occluded and attempt to salvage is pending. Access work is mainly done in [place] with [facility] also involved.    . Chronic hypotension 07/22/2012   Past Surgical History  Past Surgical History  Procedure Laterality Date  . Av fistula placement Right 05/08/2007  . Hero avg Left 12/14/2008  . Hero  04/17/2009    Removed clotted Left Hero  2) New Right Hero placed and Stent to SVC  . Mastectomy Left   . Exploration renal s/p transplantation    . Tubal ligation    . Avgg removal  08/07/2012    Procedure: REMOVAL OF ARTERIOVENOUS GORETEX GRAFT ([place]);  Surgeon: Rosetta Posner, MD;  Location: [place];  Service: Vascular;  Laterality: Right;  . Right lower extremity vascular bypass    . Colonoscopy N/A 08/18/2015    Procedure: COLONOSCOPY;  Surgeon: Ladene Artist, MD;  Location: [facility] ENDOSCOPY;  Service: Endoscopy;  Laterality: N/A;   Family History  Family History  Problem Relation Age of Onset  . Diabetes Mother   . Diabetes Maternal Aunt     x 3  . Colon cancer Maternal Uncle   . Heart disease Maternal Grandmother   . Diabetes Maternal Grandmother   . Stroke Maternal Grandmother   . Hypertension Brother   . Hypertension Brother   . Hypertension Brother   . Prostate cancer Maternal Uncle    Social History  reports that she quit smoking about 12 years ago. She has never used smokeless tobacco. She reports that she drinks alcohol. She reports that she does not use illicit drugs. Allergies No Known Allergies Home medications Prior to Admission medications   Medication Sig Start Date End Date Taking? Authorizing  Provider  aspirin EC 325 MG tablet Take 325 mg by mouth daily.   Yes Historical Provider, MD  Calcium Acetate 667 MG TABS Take 1,334-2,001 mg by mouth 3 (three) times daily with meals. Take 2001 mg by mouth 3 times daily with meals. Take 1334 mg by mouth twice daily with snacks or deserts.   Yes Historical Provider, MD  fludrocortisone (FLORINEF) 0.1 MG tablet Take 2 tablets (0.2 mg total) by mouth daily. 12/12/14  Yes Renato Shin, MD  gabapentin (NEURONTIN) 100 MG capsule Take 100 mg by mouth at bedtime.  11/18/13  Yes Historical Provider, MD  midodrine (PROAMATINE) 10 MG tablet Take 1 tablet (10 mg total) by mouth 3 (three) times daily. 09/06/12  Yes Theodis Blaze, MD  norethindrone (MICRONOR,CAMILA,ERRIN) 0.35 MG tablet Take 1 tablet (0.35 mg total) by mouth daily. 11/23/14  Yes Lahoma Crocker, MD  oxyCODONE-acetaminophen (PERCOCET/ROXICET) 5-325 MG per tablet Take 1 tablet by mouth every 6 (six) hours as needed for severe pain. 08/21/15  Yes Albertine Patricia, MD  SENSIPAR 30 MG tablet Take 30 mg by mouth daily.  07/08/12  Yes Historical Provider, MD   Liver Function Tests  Recent Labs Lab 08/21/15 0459 09/14/2015 1125  AST  --  36  ALT  --  18  ALKPHOS  --  85  BILITOT  --  0.7  PROT  --  7.5  ALBUMIN 3.3* 3.7    Recent Labs Lab 09/15/2015 1125  LIPASE 21*   CBC  Recent Labs Lab 08/18/15 0518 08/21/15 0459 08/29/2015 1125 09/03/2015 1610  WBC 7.3 6.8 9.5  --   NEUTROABS  --   --  7.6  --   HGB 10.2* 10.4* 11.7* 14.6  HCT 32.2* 32.6* 36.6 43.0  MCV 97.9 97.3 97.6  --   PLT 252 237 305  --    Basic Metabolic Panel  Recent Labs Lab 08/18/15 0518 08/21/15 0459 08/21/2015 1125 09/02/2015 1610  NA 138 133* 134* 135  K 4.0 3.9 4.5 7.4*  CL 97* 93* 94* 106  CO2 22 25 19*  --   GLUCOSE 88 96 159* 146*  BUN 40* 33* 34* 52*  CREATININE 15.20* 13.40* 12.39* 12.40*  CALCIUM 8.2* 8.7* 8.5*  --   PHOS  --  6.8*  --   --     Filed Vitals:   09/14/2015 1432 09/13/2015 1455  09/04/2015 1630 08/23/15 1645  BP: 67/55 85/56 85/54  80/29  Temp:      TempSrc:      Resp: 24 29 16 22   SpO2:       Exam On vent, intubated, BP 60'/30's No rash, cyanosis or gangrene Sclera anicteric ETT in place  No jvd  Chest clear bilat RRR no MRG Abd soft nondistended, dec'd BS, no ascites GU deferred MS no joint effusion , no ulcers / wounds ;/ gangrene No LE edema, no UE edema Old HD accesses bilat UE's L thigh tunneled HD cath in place, clean exit site  [place] [facility] 4h  L thigh cath  Hep 3000 x 2   Assessment: 1. Shock/ cardiac arrest 2. ESRD had HD yesterday 3. Chronic hypotension on midodrine 4. Multiple access failures 5. HPTH 6. Anemia Hb up, no need for ESA   Plan- supportive care, will consider CRRT tonight if K truly high and not hemolyzed (repeat K pending)  Kelly Splinter MD (pgr) [phone]    [phone] 08/22/2015, 5:08 PM

## 2015-08-23 NOTE — Progress Notes (Signed)
Reader Progress Note Patient Name: Judy Green DOB: 10-01-1966 MRN: [id number]   Date of Service  09/05/2015  HPI/Events of Note  CTA with massive bilateral PE's with evidence of R heart strain. Recent Bx of colon.   eICU Interventions  Will order: 1. Heparin IV infusion per pharmacy consult.  Dr. Chase Caller to speak with GI about the risk of EKOS/Systemic lytics in this patient.      Intervention Category Major Interventions: Respiratory failure - evaluation and management;Shock - evaluation and management  Sommer,Steven Eugene 09/07/2015, 7:00 PM

## 2015-08-23 NOTE — Progress Notes (Signed)
Had to place bite block in patients mouth to prevent her from biting her tube and her tongue.

## 2015-08-24 ENCOUNTER — Inpatient Hospital Stay (HOSPITAL_COMMUNITY): Payer: Medicare Other

## 2015-08-24 ENCOUNTER — Encounter (HOSPITAL_COMMUNITY): Payer: Medicare Other

## 2015-08-24 LAB — CBC
HCT: 36.2 % (ref 36.0–46.0)
HEMOGLOBIN: 11.2 g/dL — AB (ref 12.0–15.0)
MCH: 32 pg (ref 26.0–34.0)
MCHC: 30.9 g/dL (ref 30.0–36.0)
MCV: 103.4 fL — ABNORMAL HIGH (ref 78.0–100.0)
Platelets: 234 10*3/uL (ref 150–400)
RBC: 3.5 MIL/uL — ABNORMAL LOW (ref 3.87–5.11)
RDW: 16 % — AB (ref 11.5–15.5)
WBC: 36.6 10*3/uL — AB (ref 4.0–10.5)

## 2015-08-24 LAB — TROPONIN I: Troponin I: 0.94 ng/mL (ref <0.031)

## 2015-08-24 MED ORDER — IOHEXOL 350 MG/ML SOLN
100.0000 mL | Freq: Once | INTRAVENOUS | Status: AC | PRN
  Administered 2015-08-24: 100 mL via INTRAVENOUS

## 2015-08-24 MED ORDER — EPINEPHRINE HCL 0.1 MG/ML IJ SOSY
PREFILLED_SYRINGE | INTRAMUSCULAR | Status: AC
  Filled 2015-08-24: qty 10

## 2015-08-24 MED ORDER — SODIUM CHLORIDE 0.9 % IV SOLN
25.0000 ug/h | INTRAVENOUS | Status: DC
  Administered 2015-08-24: 50 ug/h via INTRAVENOUS
  Filled 2015-08-24: qty 50

## 2015-08-24 MED FILL — Medication: Qty: 1 | Status: AC

## 2015-08-28 ENCOUNTER — Telehealth: Payer: Self-pay

## 2015-08-28 LAB — CULTURE, BLOOD (ROUTINE X 2)
CULTURE: NO GROWTH
CULTURE: NO GROWTH
Culture: NO GROWTH

## 2015-08-28 NOTE — Telephone Encounter (Signed)
On 2015-09-03 I received a death certificate from [place]. The death certificate is for cremation. The patient is a patient of Doctor Ramaswamy. The death certificate will be taken to the pulmonary unit for signature this am. On 09/03/15 I received the death certificate from Doctor Halford Chessman who signed it for Doctor Ramaswamy. I got the death certificate ready for pickup and called the funeral home to let them know the death certificate was ready for pickup.

## 2015-08-30 ENCOUNTER — Ambulatory Visit: Payer: Medicare Other | Admitting: Physician Assistant

## 2015-09-16 NOTE — Progress Notes (Signed)
eLink Physician-Brief Progress Note Patient Name: Judy Green DOB: October 06, 1966 MRN: [id number]   Date of Service  2015/08/27  HPI/Events of Note  CODE BLUE at 1:19am, bradycardia>>Asystole, ACLS started and continued for 32mins with NO ROSC  eICU Interventions  Family at bedside during code Bilateral PE s\p lytics, with RV strain and hemodynamic compromise.  ACLS x 33 mins with NO ROSC or shockable rhythm     Intervention Category Major Interventions: Other:  Anaelle Dunton 08/27/2015, 1:54 AM

## 2015-09-16 NOTE — Discharge Summary (Signed)
DISCHARGE SUMMARY    Date of admit: 09/01/2015 11:36 AM Date of discharge: 2015/09/08  6:19 AM Length of Stay: 1 days  PCP is POLITE,RONALD D, MD   PROBLEM LIST Active Problems: Acute massive pulmonary embolism with associated   -   Hypotension    - Cardiac arrest    - Shock circulatory    - Acute respiratory failure    - Lactic acidosis       Refusal of blood transfusions as patient is Jehovah's Witness    SUMMARY Judy Green was 49 y.o. patient with    has a past medical history of ESRD (end stage renal disease); Rectal bleeding (2007); Retroperitoneal hematoma (9/07); H/O parathyroidectomy (1999); UTI (lower urinary tract infection); Adrenal insufficiency (2009); Sleep apnea; Oral contraceptive use; Hypertension; Chronic headaches; H/O syncope; Breast cancer; Anemia; History of multiple failed HD accesses (09/19/2014); and Chronic hypotension (07/22/2012).   has past surgical history that includes AV fistula placement (Right, 05/08/2007); Hero AVG (Left, 12/14/2008); Hero (04/17/2009); Mastectomy (Left); Exploration renal s/p transplantation; Tubal ligation; Arteriovenous goretex graft removal (08/07/2012); right lower extremity vascular bypass; and Colonoscopy (N/A, 08/18/2015).   Admitted on 08/22/2015 with recent admission for ischemic colitis. She is also Jehovah Witness and will not accept blood. She is s/p failed renal transplant and on ESRD with near end stage venous acccess. She was admitted as a cardiac PEA arrest and in circulatory shock with severe lactic acidosis. CTA revealed Acute Massive Pulmonary Embolism. Careful decision weighing risks and benefits of systemic thrombolysis was taken and all participants including GI doc, family brought on board and decision was taken to administer systemic thrombolysis. However, despite this she died in ICU 09/08/2015 . Of note, there was a possibility she also had developed post cardiac arrest seizures.     SIGNED Dr.  Brand Males, M.D., [facility].C.P Pulmonary and Critical Care Medicine Staff Physician [place] [facility] Pager: [phone], If no answer or between  15:00h - 7:00h: call [phone]  09/07/2015 6:02 AM

## 2015-09-16 NOTE — Progress Notes (Signed)
Pt coded see code sheet

## 2015-09-16 NOTE — Code Documentation (Signed)
  Patient Name: Judy Green   MRN: [id number]   Date of Birth/ Sex: 03/29/1966 , female      Admission Date: 08/18/2015  Attending Provider: Brand Males, MD  Primary Diagnosis: Cardiac arrest [I46.9] Coma [R40.20] Diarrhea [R19.7] Lactic acidosis [E87.2] Pain [R52] ESRD (end stage renal disease) [N18.6] Elevated troponin [R79.89] LUQ abdominal tenderness [R10.812] Orthostatic lightheadedness [R42] Hypotension, unspecified hypotension type [I95.9] Chest pain, unspecified chest pain type [R07.9] Other fatigue [R53.83] Non-intractable vomiting with nausea, vomiting of unspecified type [R11.2]   Indication: Pt was in her usual state of health until this AM, when she was noted to be bradycardic and bleeding into her ET tube. Code blue was subsequently called. At the time of arrival on scene, ACLS protocol was underway.   Technical Description:  - CPR performance duration:  33  minutes  - Was defibrillation or cardioversion used? No   - Was external pacer placed? Yes  - Was patient intubated pre/post CPR? Yes   Medications Administered: Y = Yes; Blank = No Amiodarone    Atropine  1  Calcium    Epinephrine  10  Lidocaine    Magnesium    Norepinephrine  y  Phenylephrine    Sodium bicarbonate  1  Vasopressin    Other    Post CPR evaluation:  - Final Status - Was patient successfully resuscitated ? No   Miscellaneous Information:  - Time of death:  35  AM  - Primary team notified?  Yes  - Family Notified? Yes     Iline Oven, MD   09-03-15, 1:54 AM

## 2015-09-16 NOTE — Progress Notes (Signed)
Chaplain responded to page from unit that pt has passed.  Chaplain met with pt's spouse at bedside who is grieving appropriately.  Chaplain also met with pt's children in waiting area who are also grieving heavily and appropriately.  Chaplain provided emotional and spiritual support both at bedside and in waiting through empathetic listening, hospitality and presence.  Chaplain available to follow up as needed.    09/12/2015 0200  Clinical Encounter Type  Visited With Family  Visit Type Follow-up;Spiritual support;Death  Referral From Nurse  Spiritual Encounters  Spiritual Needs Emotional;Grief support  Stress Factors  Family Stress Factors Loss;Major life changes   Geralyn Flash 2015-09-12 2:49 AM

## 2015-09-16 NOTE — Progress Notes (Addendum)
Pt's SBP in 70's, ok to increase Levophed to 7mcg/min, per Montey Hora. Pt has HD cath access.

## 2015-09-16 DEATH — deceased

## 2016-06-10 IMAGING — DX DG ABDOMEN ACUTE W/ 1V CHEST
4 series · 4 of 4 positions shown · non-contrast
Comparison: Radiographs dated 12/14/2013

CLINICAL DATA: Chest pain and low upper quadrant abdominal pain.
Vomiting.

EXAM:
DG ABDOMEN ACUTE W/ 1V CHEST

[chest pa]
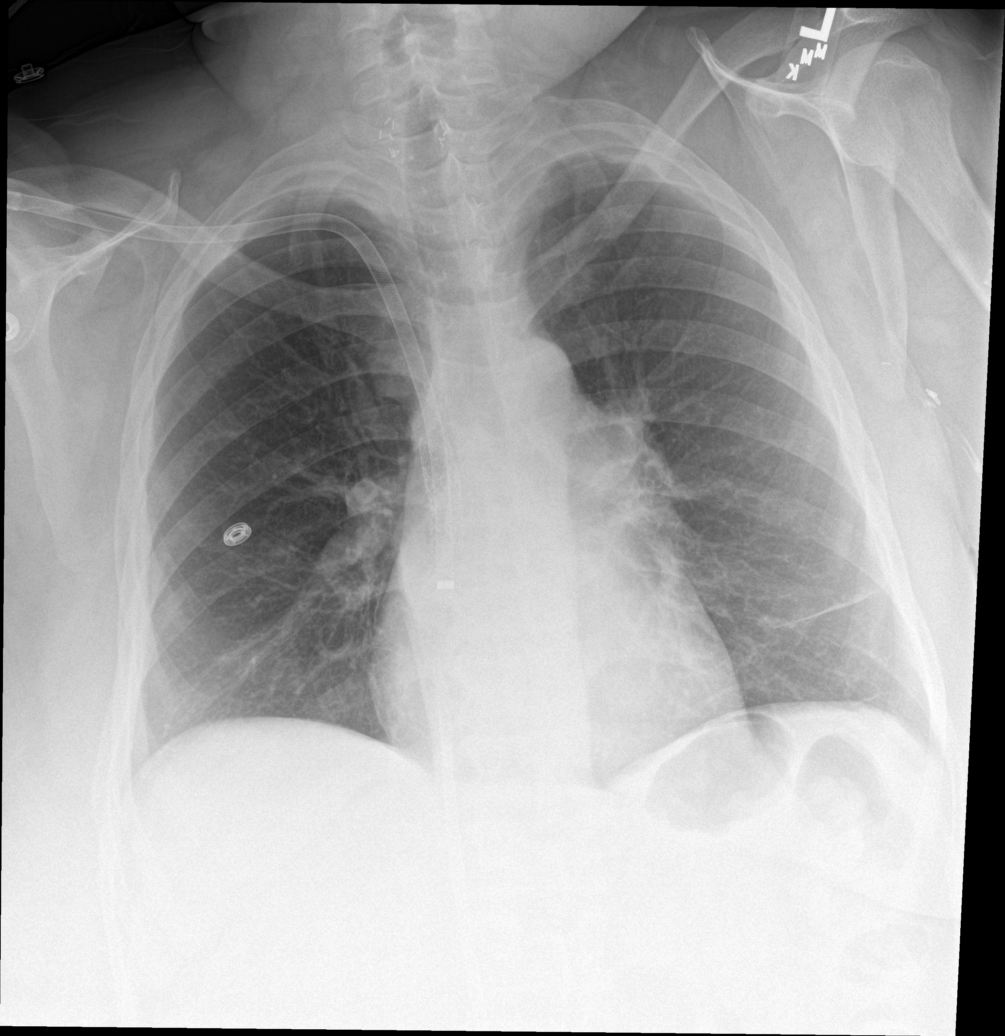

[abdomen erect]
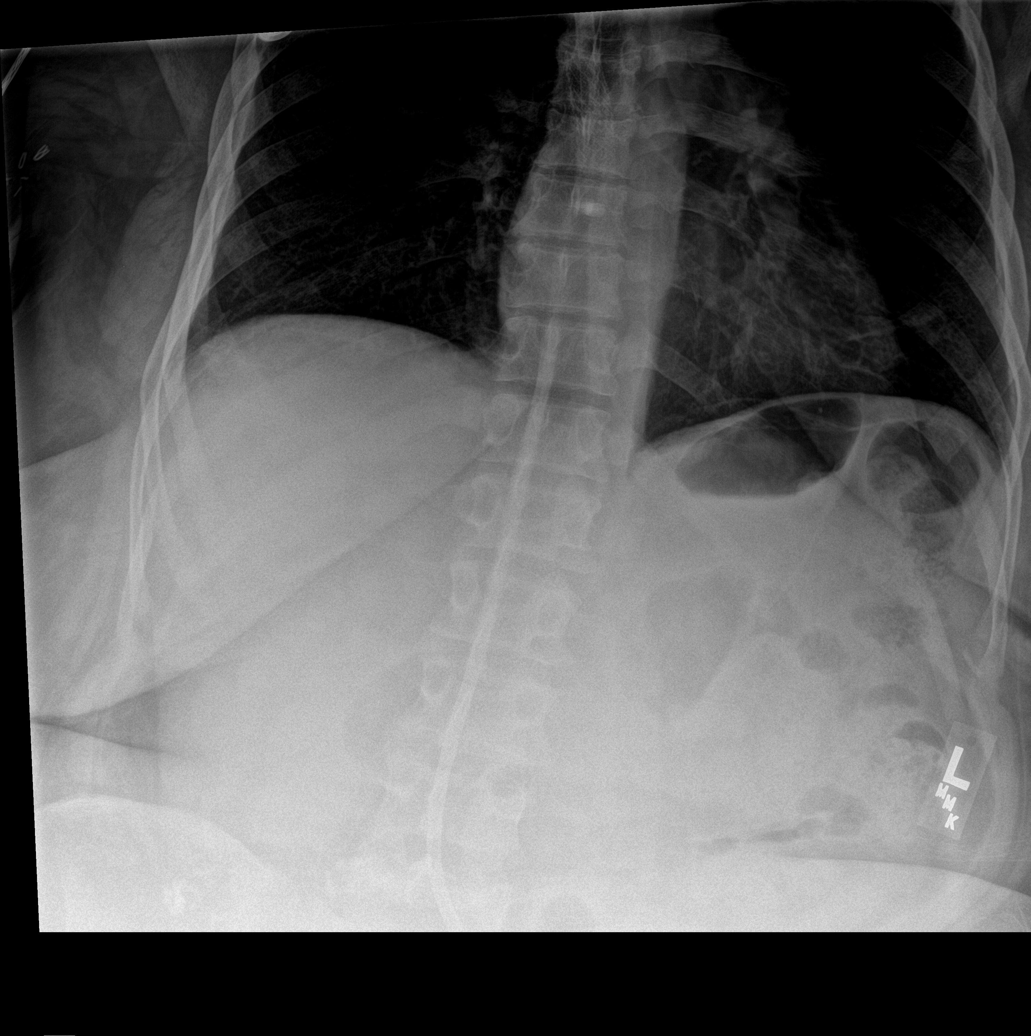

[abdomen supine (1 of 2)]
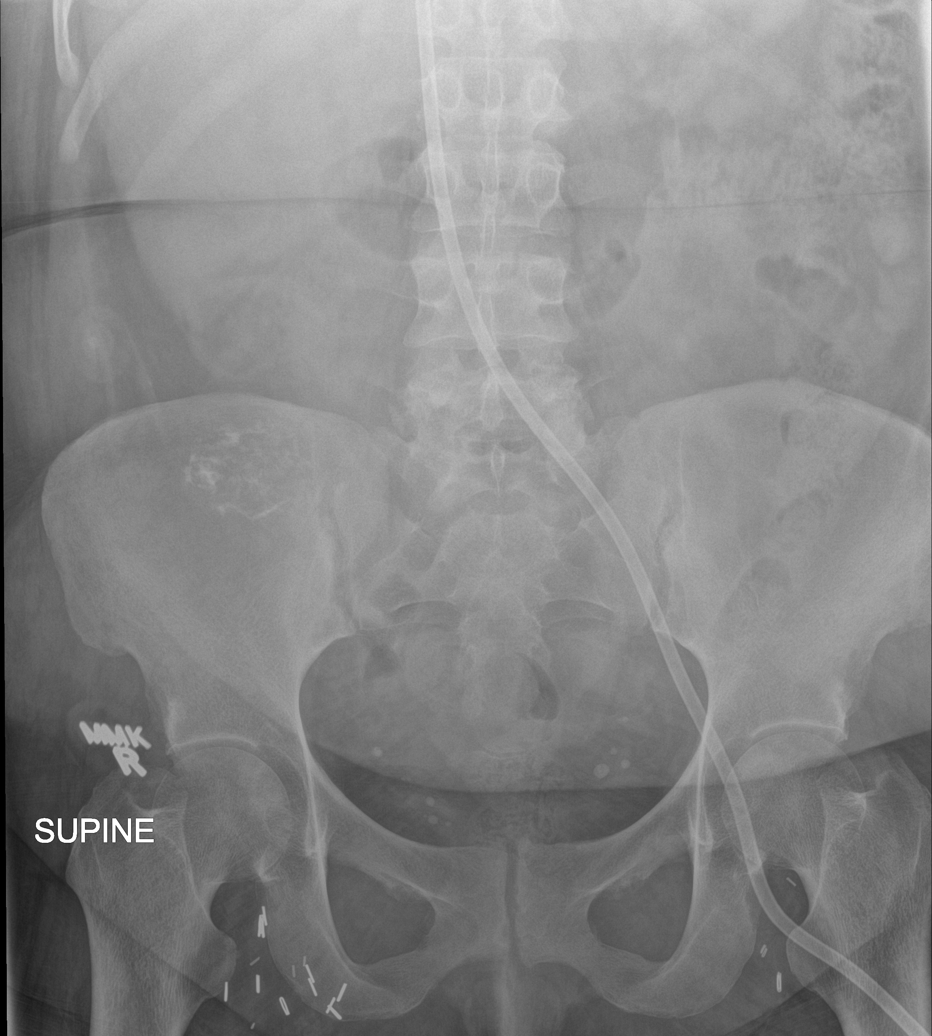

[abdomen supine (2 of 2)]
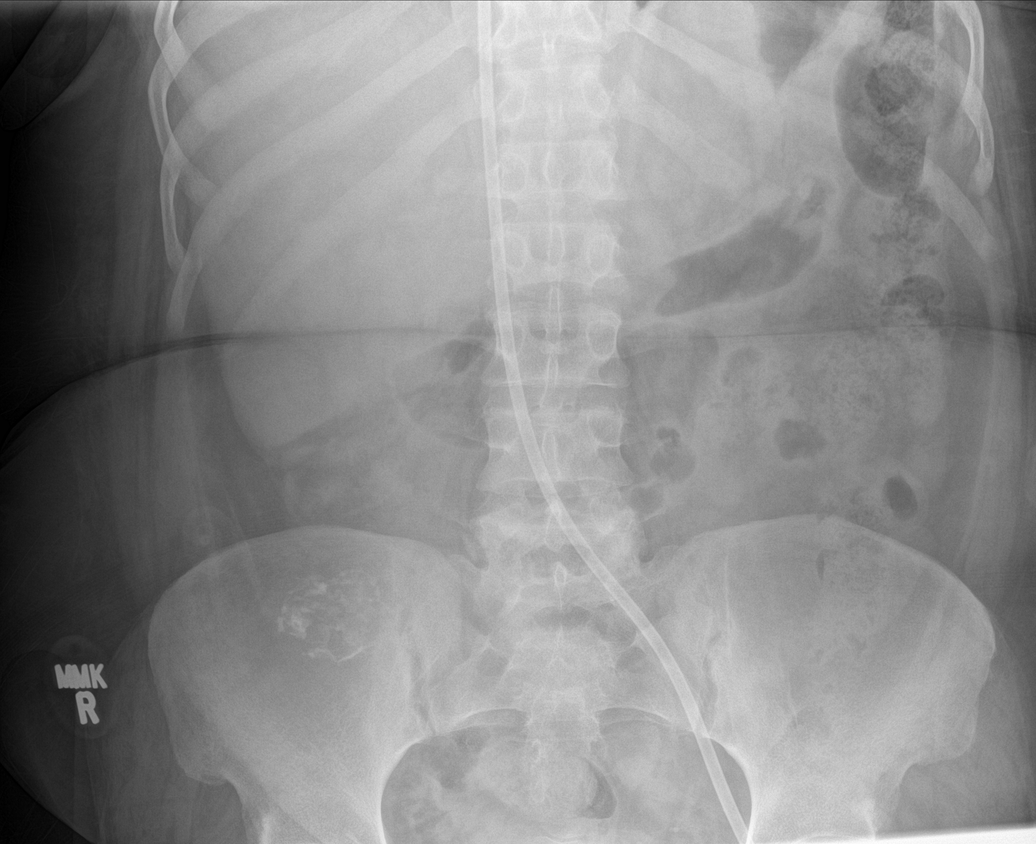

[4 of 4 positions shown; findings below may reference images not displayed]

FINDINGS: Central venous dialysis catheters are seen from below in from above
with the tips in the right atrium. Heart size and pulmonary
vascularity are normal. Minimal scarring at the left lung base. No
effusions. No free air or dilated loops of large or small bowel.
Calcified atrophic right pelvic renal transplant. No acute osseous
abnormality.
IMPRESSION: Benign appearing abdomen and chest.  No acute abnormalities.

## 2016-06-10 IMAGING — CT CT ANGIO CHEST
4 of 13 series · 16 of 46 positions shown · IV contrast (Omni 300)
Comparison: Chest radiograph earlier same day; abdomen pelvic CT
08/15/2015

CLINICAL DATA: Patient status post cardiac arrest. On dialysis.
Patient ventilated. Prior history of breast cancer.



[Series 4: pe 3.0 i30f 3 · axial · 0.92mm/px · z∈[-509,-347]mm · 4 of 90 slices shown]
[im 18/90  lung]
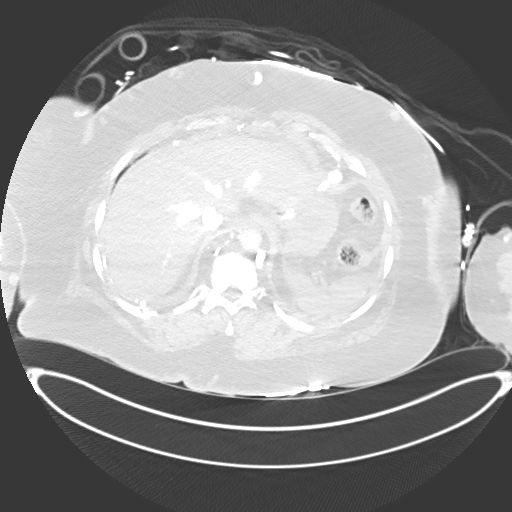
[im 36/90  soft-tissue]
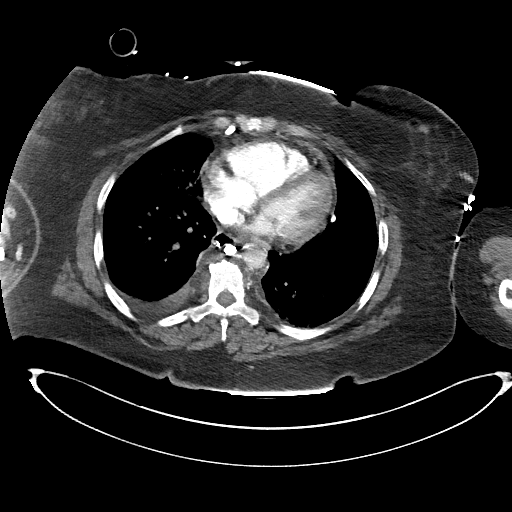
[im 54/90  lung]
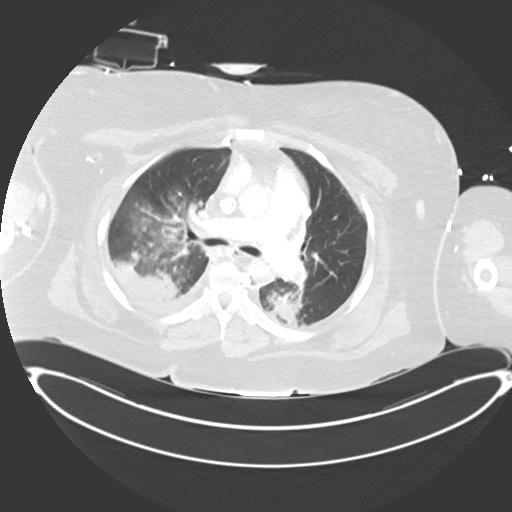
[im 72/90  soft-tissue]
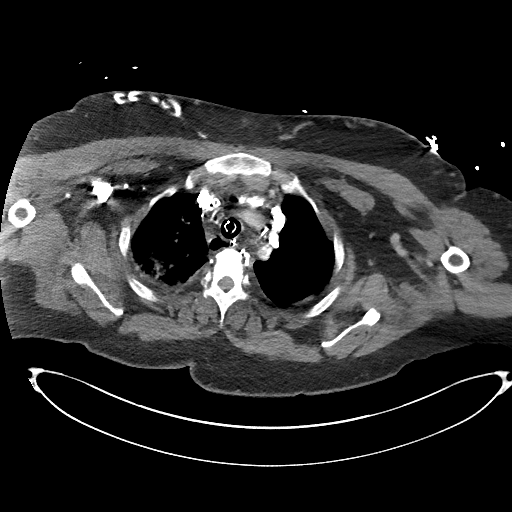

[Series 5: thins · axial · 0.92mm/px · z∈[-500,-326]mm · 8 of 204 slices shown]
[im 15/204  lung]
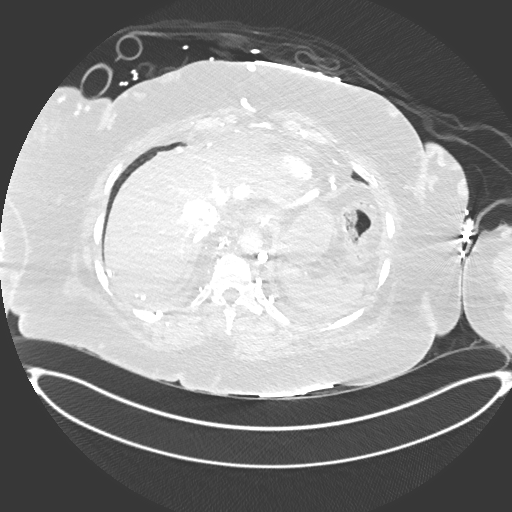
[im 44/204  lung]
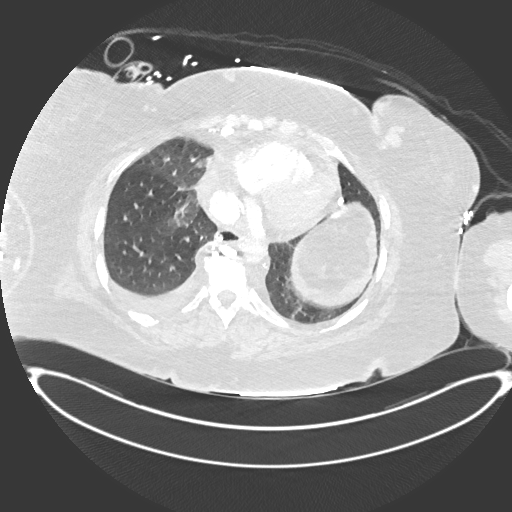
[im 73/204  lung]
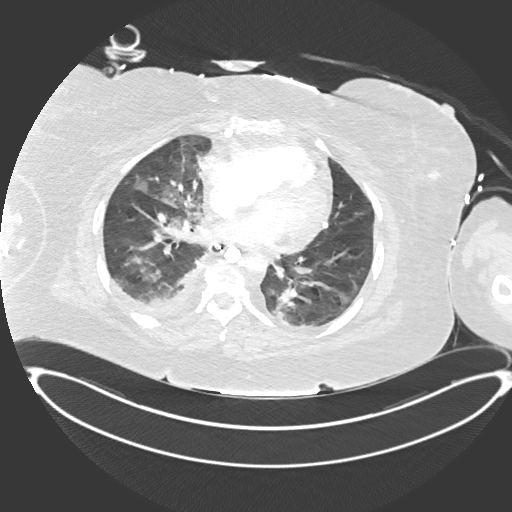
[im 88/204  lung]
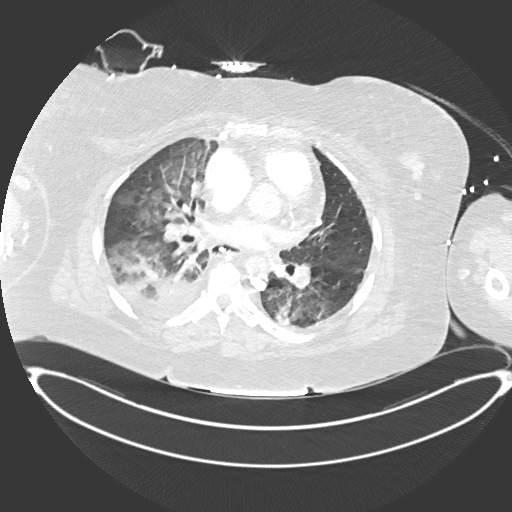
[im 117/204  lung]
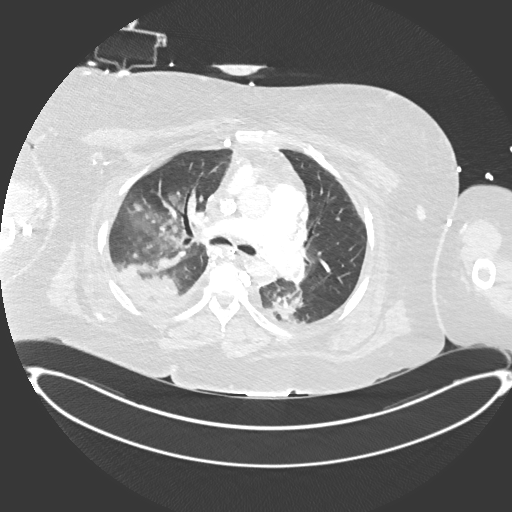
[im 131/204  lung]
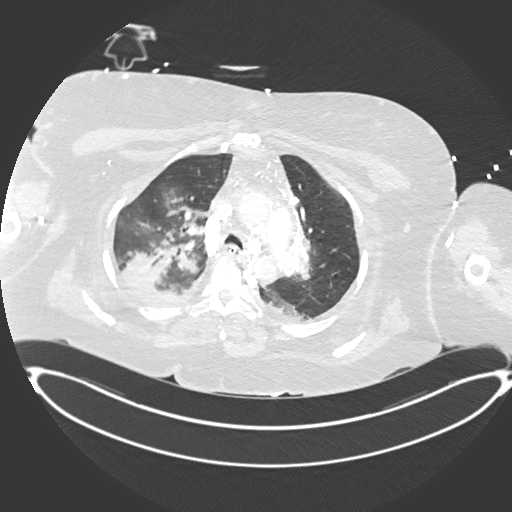
[im 160/204  lung]
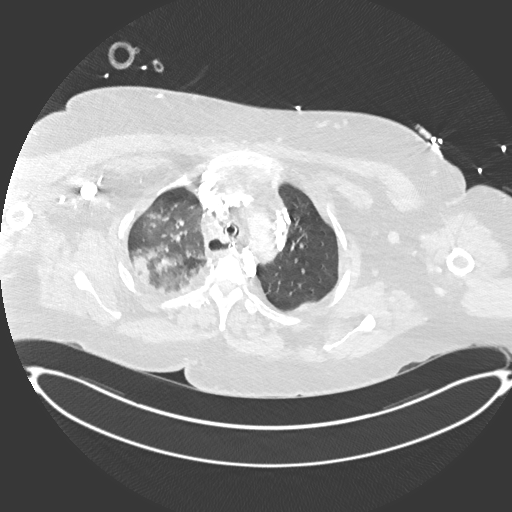
[im 189/204  lung]
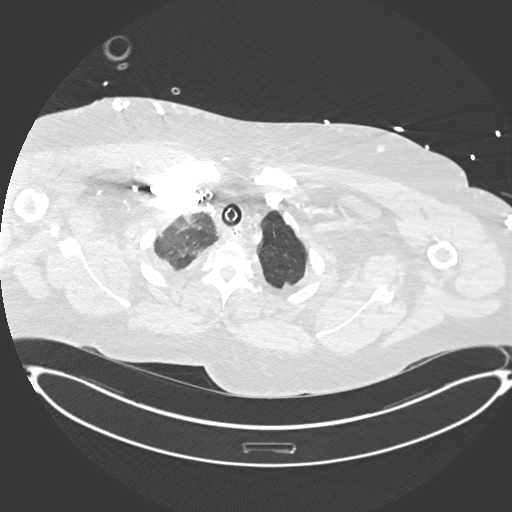

[Series 7: coronal mpr · coronal · 0.63mm/px · 1 of 127 slices shown]
[im 64/127  soft-tissue]
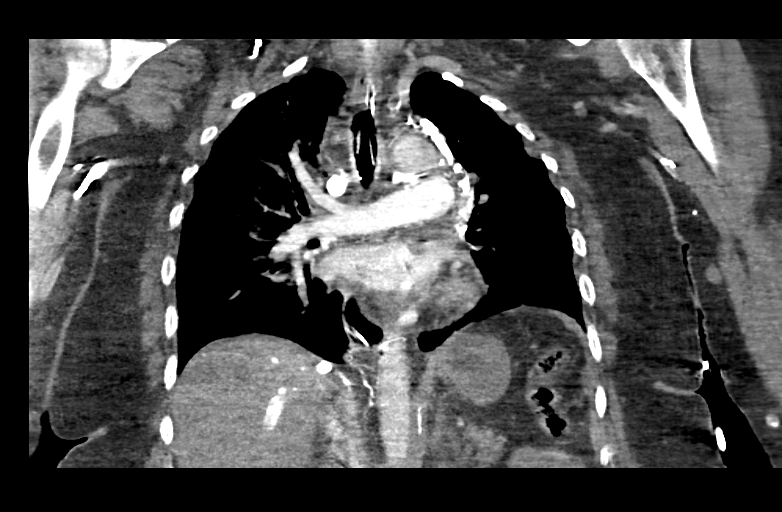

[Series 11: abd/ pelvis 5.0 i30f 1 · axial · 0.98mm/px · z∈[-819,-659]mm · 3 of 95 slices shown]
[im 16/95  lung]
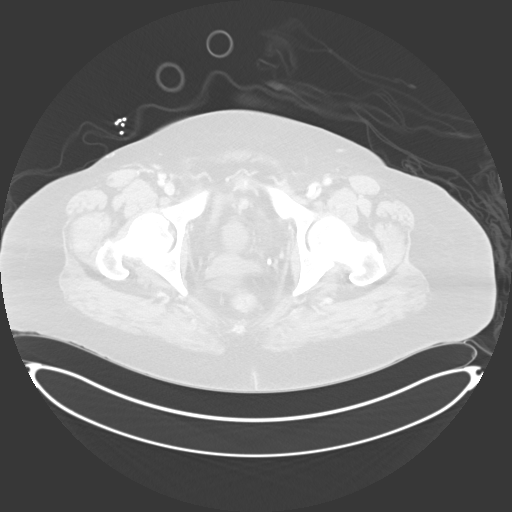
[im 32/95  lung]
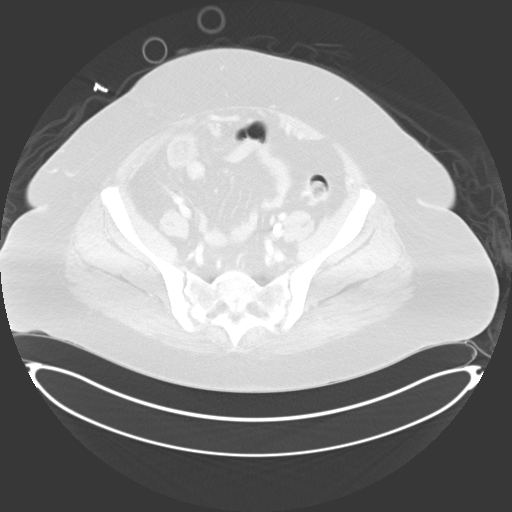
[im 48/95  lung]
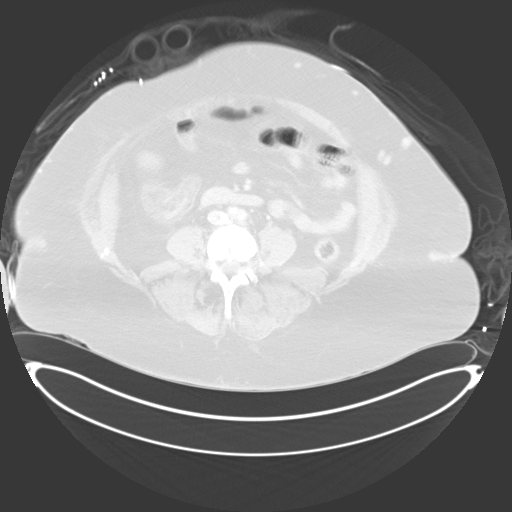

[16 of 46 positions shown; findings below may reference images not displayed]

FINDINGS: CTA CHEST FINDINGS

Mediastinum/Nodes: ET tube terminates in the distal trachea. Enteric
tube tip terminates in the stomach. There is a small amount of
intermediate attenuation mediastinal fluid, particularly within the
superior mediastinum (image 26; series 4) and in the retrosternal
location. No enlarged axillary, mediastinal or hilar
lymphadenopathy. Stent material is demonstrated within the superior
vena cava. There are multiple chest wall collateral vessels, likely
secondary to chronic SVC obstruction.

Extensive pulmonary emboli demonstrated within the bilateral
pulmonary arterial tree, most pronounced within the lower lobes.
There is evidence of right heart strain with dilatation of right
ventricle as well as reflux of contrast into the liver. Right
ventricle: 53 mm ; left ventricle: 37 mm (RV/LV ratio: 1.4).

Lungs/Pleura: ET tube terminates at the carina. Small right pleural
effusion. Ground-glass opacities scattered throughout the right
upper, right middle and right lower lobes. Additionally there is
more focal consolidation within the posterior right middle and right
lower lobes. Consolidative opacities within the left lower lobe. No
pneumothorax.

CT ABDOMEN and PELVIS FINDINGS

Hepatobiliary: Liver is normal in size and contour. Motion artifact
limits evaluation however there is suggestion of gallbladder wall
thickening. There is surrounding fluid within the right upper
quadrant as well as associated fat stranding.

Pancreas: Mildly atrophic.

Spleen: Unremarkable

Adrenals/Urinary Tract: Normal adrenal glands. Native kidneys are
atrophic. Small stone within the superior pole of the right native
kidney. Unchanged exophytic cyst off of native left kidney. Multiple
additional low attenuation lesions are too small to characterize.

Stomach/Bowel: Re- demonstrated circumferential wall thickening of
the cecum and ascending colon. No evidence for small bowel
obstruction.

Vascular/Lymphatic: Normal caliber abdominal aorta. No
retroperitoneal lymphadenopathy. Left femoral approach central
venous catheter is present with tip terminating at inferior aspect
of the right atrium.

Other: There is small amount of fluid within the pelvis. Uterus is
unremarkable. Small amount of perisplenic fluid. Probable calcified
right lower quadrant renal transplant.

Musculoskeletal: Bilateral L5 pars defects. Anterior fractures
involving the left third through seventh ribs with displaced
fractures involving the anterior left fourth and fifth ribs.
Probable AVN bilateral femoral heads.

Review of the MIP images confirms the above findings.
IMPRESSION: Extensive bilateral acute pulmonary embolism with evidence for right
heart strain. Positive for acute PE with CT evidence of right heart
strain (RV/LV Ratio = 1.4) consistent with at least submassive
(intermediate risk) PE. The presence of right heart strain has been
associated with an increased risk of morbidity and mortality. Please
activate Code PE by paging [phone].

Bilateral consolidative pulmonary opacities which may represent
aspiration, hemorrhage and/or contusion.

Small right pleural effusion.

ET tube terminates in the distal trachea. Recommend retraction and
repositioning.

There is suggestion of wall thickening involving the gallbladder
with surrounding fluid and inflammatory stranding. Recommend further
evaluation with right upper quadrant ultrasound as acute
cholecystitis is not excluded.

Re- demonstrated wall thickening of distal ileum and cecum which is
nonspecific however may be secondary to sequelae of inflammatory
bowel disease. Colitis could have a similar appearance.

Acute left third through seventh rib fractures.

Small amount of mixed attenuation fluid within superior mediastinum
and retrosternal location likely secondary to blood products,
potentially from venous hemorrhage.

Critical Value/emergent results were called by telephone at the time
of interpretation on 08/23/2015 at [DATE] to Dr. Theodorin, who verbally
acknowledged these results.

## 2016-06-10 IMAGING — CT CT HEAD W/O CM
1 series · 16 of 30 positions shown, 20 images · non-contrast
Comparison: September 03, 2012

CLINICAL DATA: Cardiac arrest on ventilator.

EXAM:
CT HEAD WITHOUT CONTRAST
TECHNIQUE: Contiguous axial images were obtained from the base of the skull
through the vertex without intravenous contrast.

[Series 2: head 5.0 h30s · axial · 0.46mm/px · z∈[-169,-19]mm · 16 of 34 slices shown, 20 images]
[im 2/34  brain]
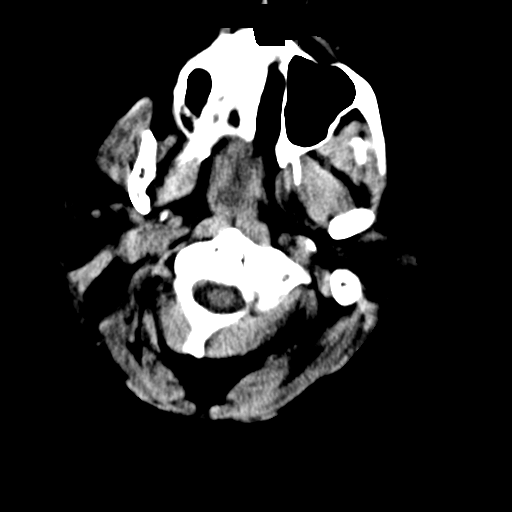
[im 2/34  bone]
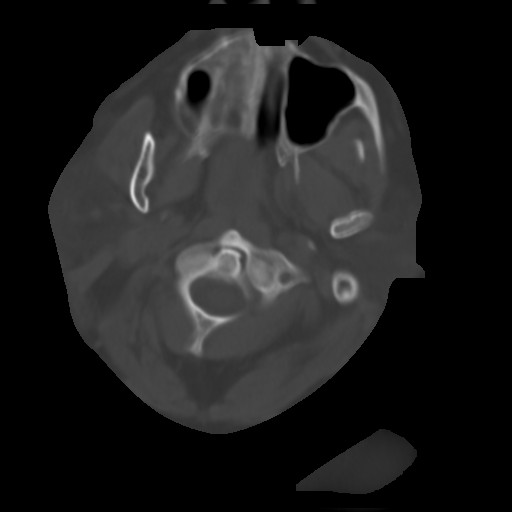
[im 4/34  brain]
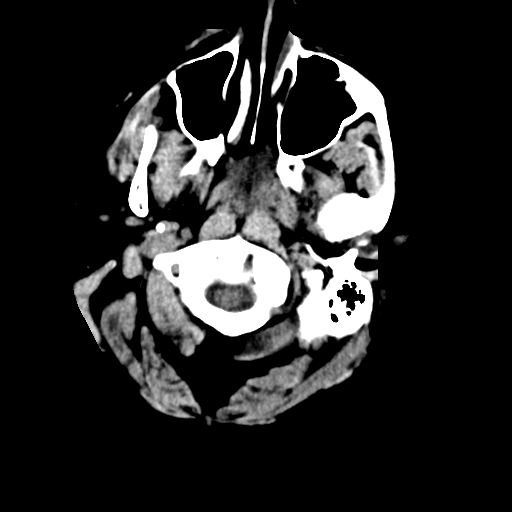
[im 6/34  brain]
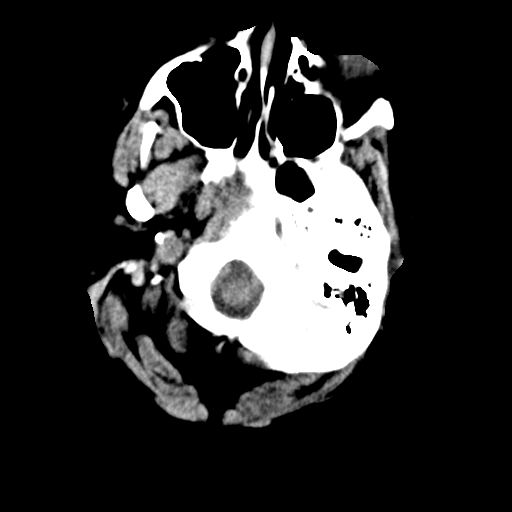
[im 8/34  brain]
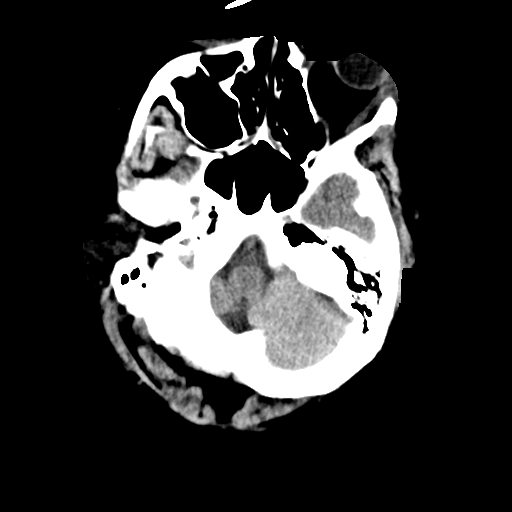
[im 10/34  brain]
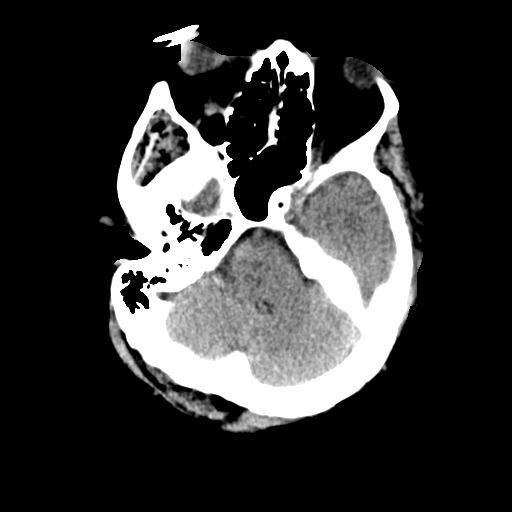
[im 10/34  bone]
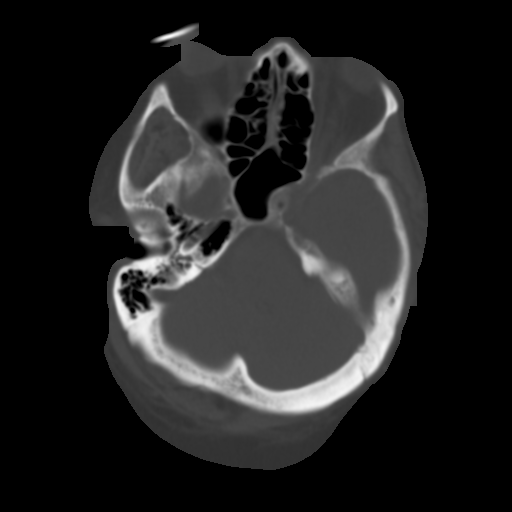
[im 12/34  brain]
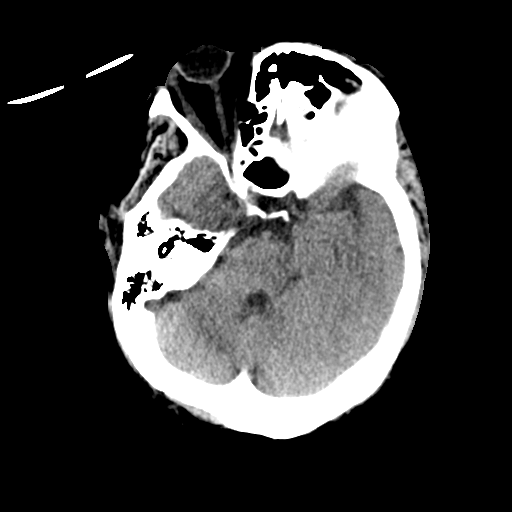
[im 14/34  brain]
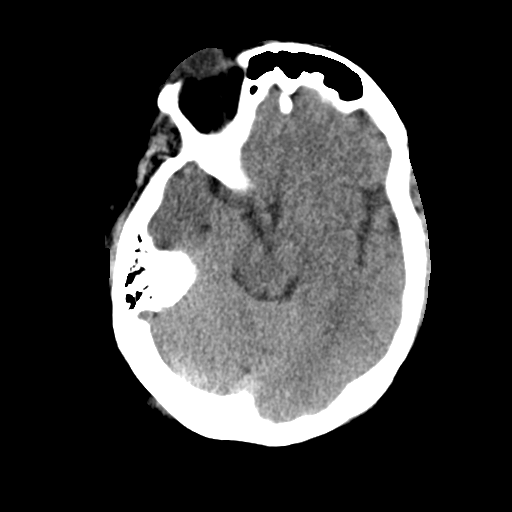
[im 16/34  brain]
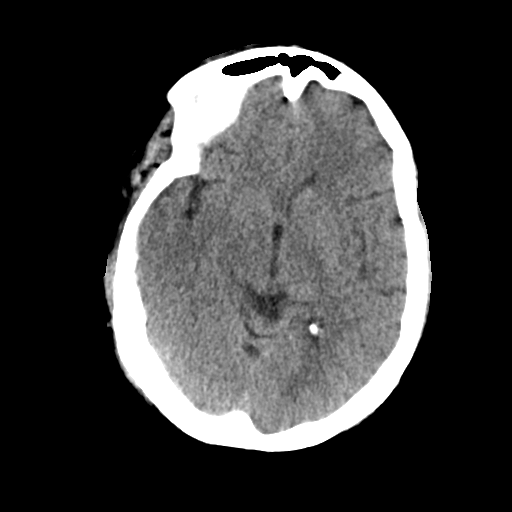
[im 18/34  brain]
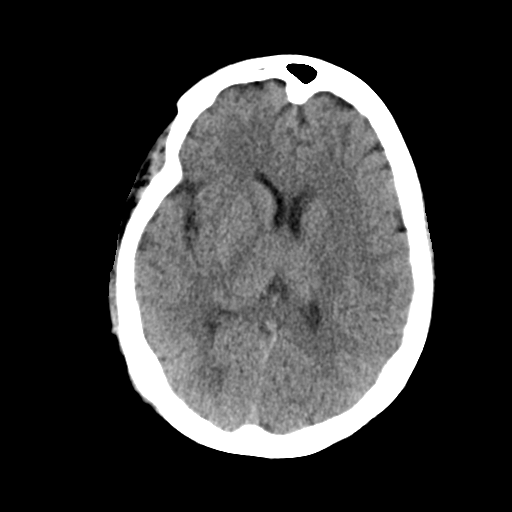
[im 18/34  bone]
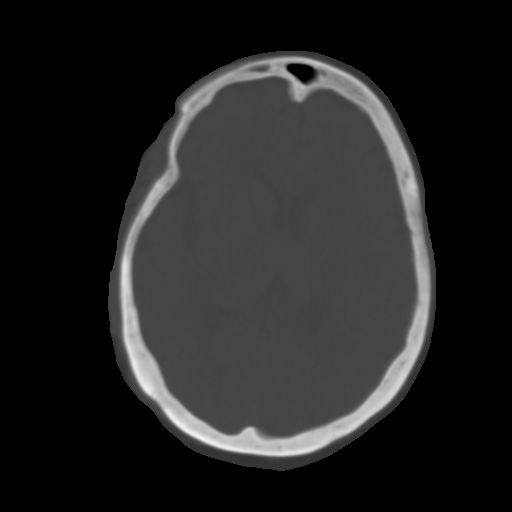
[im 20/34  brain]
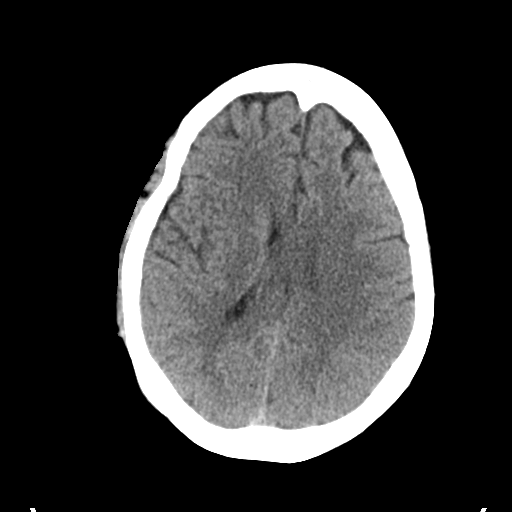
[im 22/34  brain]
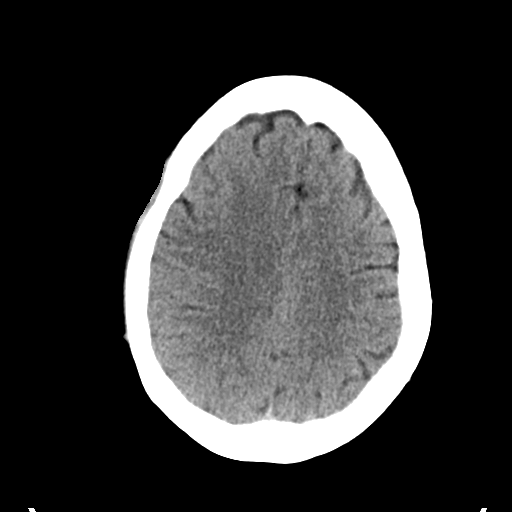
[im 24/34  brain]
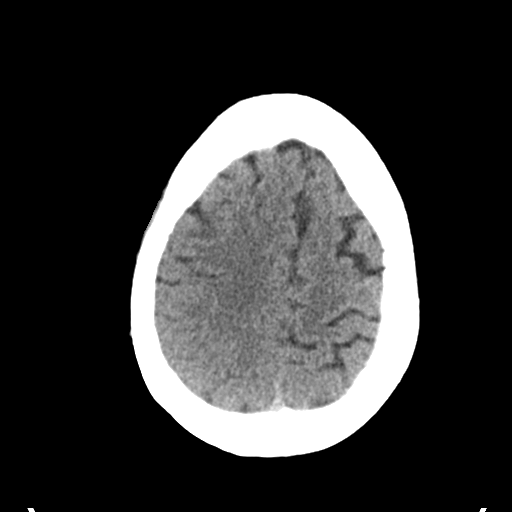
[im 26/34  brain]
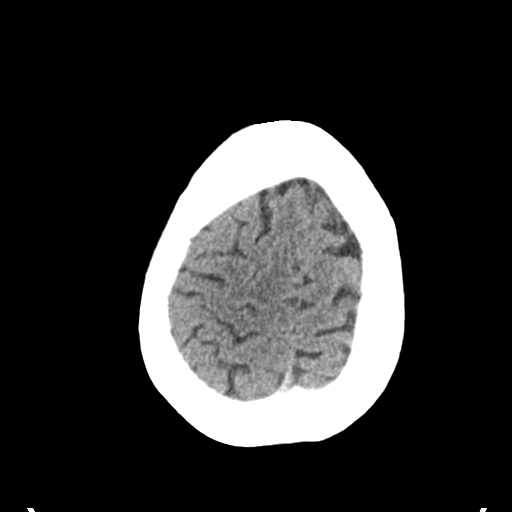
[im 26/34  bone]
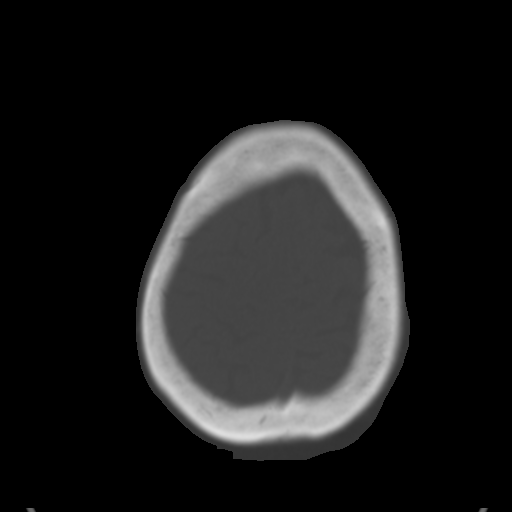
[im 28/34  brain]
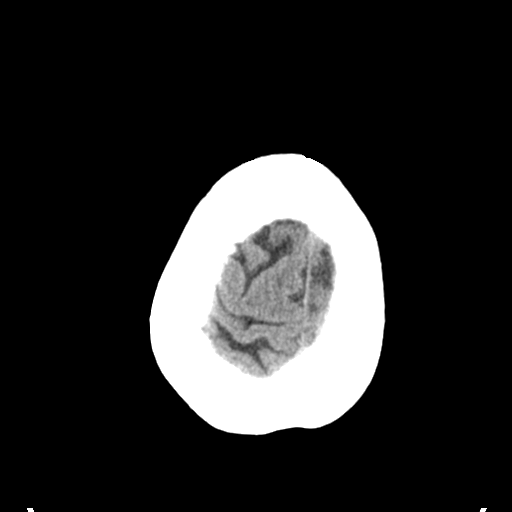
[im 30/34  brain]
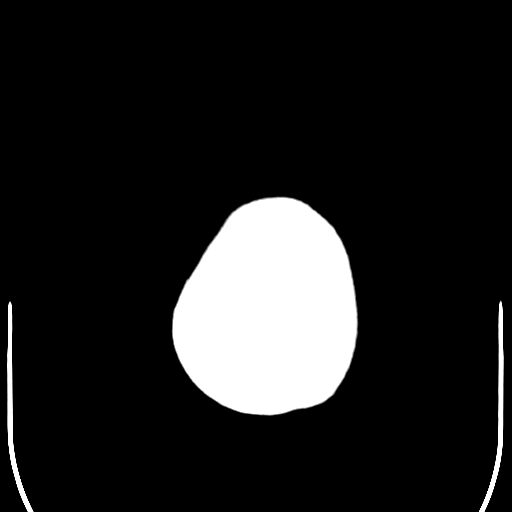
[im 32/34  brain]
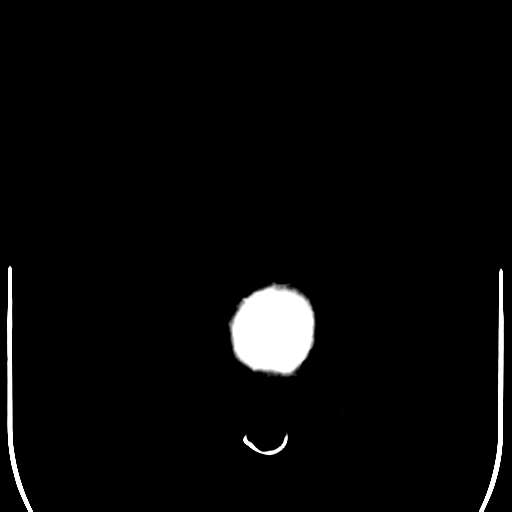

[16 of 30 positions shown; findings below may reference images not displayed]

FINDINGS: There is minimal right to left midline shift by 2 mm suggesting mild
edema of the right cerebral hemisphere. There is no acute hemorrhage
or acute transcortical infarct. There is no focal mass or
hydrocephalus. The bony calvarium is intact. The visualized sinuses
are clear.
IMPRESSION: Minimal right-to-left midline shift by 2 mm suggesting mild edema of
the right cerebral hemisphere. No acute hemorrhage.
